# Patient Record
Sex: Male | Born: 1950 | Race: Black or African American | Hispanic: No | Marital: Single | Smoking: Current every day smoker
Health system: Southern US, Community
[De-identification: ages and names within clinical notes are randomized; demographics above are authoritative.]

## PROBLEM LIST (undated history)

## (undated) DIAGNOSIS — J189 Pneumonia, unspecified organism: Secondary | ICD-10-CM

## (undated) DIAGNOSIS — J45909 Unspecified asthma, uncomplicated: Secondary | ICD-10-CM

## (undated) DIAGNOSIS — J449 Chronic obstructive pulmonary disease, unspecified: Secondary | ICD-10-CM

## (undated) DIAGNOSIS — I10 Essential (primary) hypertension: Secondary | ICD-10-CM

## (undated) HISTORY — PX: BACK SURGERY: SHX140

## (undated) HISTORY — PX: ABDOMINAL SURGERY: SHX537

## (undated) HISTORY — PX: VASCULAR SURGERY: SHX849

---

## 2011-11-10 ENCOUNTER — Inpatient Hospital Stay (HOSPITAL_COMMUNITY)
Admission: EM | Admit: 2011-11-10 | Discharge: 2011-11-11 | DRG: 195 | Disposition: A | Discharge status: Home / Self Care | Payer: Medicare Other | Attending: Internal Medicine | Admitting: Internal Medicine

## 2011-11-10 ENCOUNTER — Encounter (HOSPITAL_COMMUNITY): Payer: Self-pay | Admitting: *Deleted

## 2011-11-10 ENCOUNTER — Emergency Department (HOSPITAL_COMMUNITY): Payer: Medicare Other

## 2011-11-10 ENCOUNTER — Inpatient Hospital Stay (HOSPITAL_COMMUNITY): Payer: Medicare Other

## 2011-11-10 DIAGNOSIS — D696 Thrombocytopenia, unspecified: Secondary | ICD-10-CM

## 2011-11-10 DIAGNOSIS — J441 Chronic obstructive pulmonary disease with (acute) exacerbation: Secondary | ICD-10-CM | POA: Diagnosis present

## 2011-11-10 DIAGNOSIS — J189 Pneumonia, unspecified organism: Secondary | ICD-10-CM

## 2011-11-10 DIAGNOSIS — F17200 Nicotine dependence, unspecified, uncomplicated: Secondary | ICD-10-CM

## 2011-11-10 DIAGNOSIS — R911 Solitary pulmonary nodule: Secondary | ICD-10-CM

## 2011-11-10 DIAGNOSIS — J4489 Other specified chronic obstructive pulmonary disease: Secondary | ICD-10-CM | POA: Diagnosis present

## 2011-11-10 DIAGNOSIS — Z23 Encounter for immunization: Secondary | ICD-10-CM

## 2011-11-10 DIAGNOSIS — I10 Essential (primary) hypertension: Secondary | ICD-10-CM

## 2011-11-10 DIAGNOSIS — J449 Chronic obstructive pulmonary disease, unspecified: Secondary | ICD-10-CM

## 2011-11-10 DIAGNOSIS — R918 Other nonspecific abnormal finding of lung field: Secondary | ICD-10-CM

## 2011-11-10 DIAGNOSIS — J438 Other emphysema: Secondary | ICD-10-CM

## 2011-11-10 DIAGNOSIS — R0902 Hypoxemia: Secondary | ICD-10-CM

## 2011-11-10 HISTORY — DX: Pneumonia, unspecified organism: J18.9

## 2011-11-10 HISTORY — DX: Essential (primary) hypertension: I10

## 2011-11-10 HISTORY — DX: Unspecified asthma, uncomplicated: J45.909

## 2011-11-10 HISTORY — DX: Chronic obstructive pulmonary disease, unspecified: J44.9

## 2011-11-10 LAB — CBC
HCT: 39.3 % (ref 39.0–52.0)
Hemoglobin: 13.7 g/dL (ref 13.0–17.0)
RDW: 14.7 % (ref 11.5–15.5)
WBC: 7.3 10*3/uL (ref 4.0–10.5)

## 2011-11-10 LAB — DIFFERENTIAL
Basophils Absolute: 0 10*3/uL (ref 0.0–0.1)
Lymphocytes Relative: 22 % (ref 12–46)
Monocytes Absolute: 0.5 10*3/uL (ref 0.1–1.0)
Monocytes Relative: 7 % (ref 3–12)
Neutro Abs: 5.1 10*3/uL (ref 1.7–7.7)
Neutrophils Relative %: 70 % (ref 43–77)

## 2011-11-10 LAB — BASIC METABOLIC PANEL
CO2: 24 mEq/L (ref 19–32)
Chloride: 99 mEq/L (ref 96–112)
Creatinine, Ser: 1.08 mg/dL (ref 0.50–1.35)
GFR calc Af Amer: 84 mL/min — ABNORMAL LOW (ref >90)
Potassium: 3.5 mEq/L (ref 3.5–5.1)

## 2011-11-10 LAB — STREP PNEUMONIAE URINARY ANTIGEN: Strep Pneumo Urinary Antigen: NEGATIVE

## 2011-11-10 LAB — TROPONIN I: Troponin I: <0.3 ng/mL (ref <0.30)

## 2011-11-10 MED ORDER — IPRATROPIUM BROMIDE 0.02 % IN SOLN
0.5000 mg | Freq: Once | RESPIRATORY_TRACT | Status: AC
  Administered 2011-11-10: 0.5 mg via RESPIRATORY_TRACT
  Filled 2011-11-10: qty 2.5

## 2011-11-10 MED ORDER — DEXTROSE 5 % IV SOLN
1.0000 g | Freq: Once | INTRAVENOUS | Status: AC
  Administered 2011-11-10: 1 g via INTRAVENOUS
  Filled 2011-11-10: qty 10

## 2011-11-10 MED ORDER — ONDANSETRON HCL 4 MG PO TABS: 4.0000 mg | ORAL_TABLET | Freq: Four times a day (QID) | ORAL | Status: DC | PRN

## 2011-11-10 MED ORDER — ALBUTEROL SULFATE (5 MG/ML) 0.5% IN NEBU
5.0000 mg | INHALATION_SOLUTION | Freq: Once | RESPIRATORY_TRACT | Status: AC
  Administered 2011-11-10: 5 mg via RESPIRATORY_TRACT
  Filled 2011-11-10: qty 1

## 2011-11-10 MED ORDER — ALBUTEROL SULFATE (5 MG/ML) 0.5% IN NEBU
2.5000 mg | INHALATION_SOLUTION | Freq: Four times a day (QID) | RESPIRATORY_TRACT | Status: DC
  Administered 2011-11-10 – 2011-11-11 (×5): 2.5 mg via RESPIRATORY_TRACT
  Filled 2011-11-10 (×5): qty 0.5

## 2011-11-10 MED ORDER — LORATADINE 10 MG PO TABS
10.0000 mg | ORAL_TABLET | Freq: Every day | ORAL | Status: DC
  Administered 2011-11-10 – 2011-11-11 (×2): 10 mg via ORAL
  Filled 2011-11-10 (×2): qty 1

## 2011-11-10 MED ORDER — IPRATROPIUM BROMIDE 0.02 % IN SOLN
0.5000 mg | Freq: Four times a day (QID) | RESPIRATORY_TRACT | Status: DC
  Administered 2011-11-10 – 2011-11-11 (×5): 0.5 mg via RESPIRATORY_TRACT
  Filled 2011-11-10 (×5): qty 2.5

## 2011-11-10 MED ORDER — AZITHROMYCIN 250 MG PO TABS
500.0000 mg | ORAL_TABLET | ORAL | Status: DC
  Administered 2011-11-11: 500 mg via ORAL
  Filled 2011-11-10: qty 2

## 2011-11-10 MED ORDER — DEXTROSE 5 % IV SOLN
1.0000 g | INTRAVENOUS | Status: DC
  Administered 2011-11-11: 1 g via INTRAVENOUS
  Filled 2011-11-10 (×2): qty 10

## 2011-11-10 MED ORDER — AMLODIPINE BESYLATE 5 MG PO TABS
5.0000 mg | ORAL_TABLET | Freq: Every day | ORAL | Status: DC
  Administered 2011-11-10: 5 mg via ORAL
  Filled 2011-11-10: qty 1

## 2011-11-10 MED ORDER — ONDANSETRON HCL 4 MG/2ML IJ SOLN: 4.0000 mg | Freq: Four times a day (QID) | INTRAMUSCULAR | Status: DC | PRN

## 2011-11-10 MED ORDER — GABAPENTIN 300 MG PO CAPS
300.0000 mg | ORAL_CAPSULE | Freq: Three times a day (TID) | ORAL | Status: DC
  Administered 2011-11-10 – 2011-11-11 (×4): 300 mg via ORAL
  Filled 2011-11-10 (×3): qty 1

## 2011-11-10 MED ORDER — ACETAMINOPHEN 325 MG PO TABS: 650.0000 mg | ORAL_TABLET | Freq: Four times a day (QID) | ORAL | Status: DC | PRN

## 2011-11-10 MED ORDER — BUDESONIDE-FORMOTEROL FUMARATE 80-4.5 MCG/ACT IN AERO
2.0000 | INHALATION_SPRAY | Freq: Two times a day (BID) | RESPIRATORY_TRACT | Status: DC
  Administered 2011-11-10 – 2011-11-11 (×2): 2 via RESPIRATORY_TRACT
  Filled 2011-11-10: qty 6.9

## 2011-11-10 MED ORDER — OXYCODONE-ACETAMINOPHEN 5-325 MG PO TABS
1.0000 | ORAL_TABLET | Freq: Three times a day (TID) | ORAL | Status: DC | PRN
  Administered 2011-11-10: 1 via ORAL
  Filled 2011-11-10: qty 1

## 2011-11-10 MED ORDER — IOHEXOL 300 MG/ML  SOLN
80.0000 mL | Freq: Once | INTRAMUSCULAR | Status: AC | PRN
  Administered 2011-11-10: 80 mL via INTRAVENOUS

## 2011-11-10 MED ORDER — PANTOPRAZOLE SODIUM 40 MG PO TBEC
40.0000 mg | DELAYED_RELEASE_TABLET | Freq: Every day | ORAL | Status: DC
  Administered 2011-11-10 – 2011-11-11 (×2): 40 mg via ORAL
  Filled 2011-11-10 (×2): qty 1

## 2011-11-10 MED ORDER — ISOSORBIDE DINITRATE 20 MG PO TABS
10.0000 mg | ORAL_TABLET | Freq: Three times a day (TID) | ORAL | Status: DC
  Administered 2011-11-10 (×3): 10 mg via ORAL
  Filled 2011-11-10 (×3): qty 1

## 2011-11-10 MED ORDER — PNEUMOCOCCAL VAC POLYVALENT 25 MCG/0.5ML IJ INJ
0.5000 mL | INJECTION | INTRAMUSCULAR | Status: AC
  Administered 2011-11-11: 0.5 mL via INTRAMUSCULAR
  Filled 2011-11-10: qty 0.5

## 2011-11-10 MED ORDER — IOHEXOL 300 MG/ML  SOLN: 80.0000 mL | Freq: Once | INTRAMUSCULAR | Status: AC | PRN

## 2011-11-10 MED ORDER — MAGNESIUM HYDROXIDE 400 MG/5ML PO SUSP: 30.0000 mL | Freq: Every day | ORAL | Status: DC | PRN

## 2011-11-10 MED ORDER — PREDNISONE 20 MG PO TABS
60.0000 mg | ORAL_TABLET | Freq: Once | ORAL | Status: AC
  Administered 2011-11-10: 60 mg via ORAL
  Filled 2011-11-10: qty 3

## 2011-11-10 MED ORDER — DEXTROSE 5 % IV SOLN
500.0000 mg | Freq: Once | INTRAVENOUS | Status: AC
  Administered 2011-11-10: 500 mg via INTRAVENOUS
  Filled 2011-11-10: qty 500

## 2011-11-10 MED ORDER — ACETAMINOPHEN 650 MG RE SUPP: 650.0000 mg | Freq: Four times a day (QID) | RECTAL | Status: DC | PRN

## 2011-11-10 MED ORDER — TRAZODONE HCL 50 MG PO TABS: 50.0000 mg | ORAL_TABLET | Freq: Every evening | ORAL | Status: DC | PRN

## 2011-11-10 MED ORDER — ENOXAPARIN SODIUM 40 MG/0.4ML ~~LOC~~ SOLN
40.0000 mg | SUBCUTANEOUS | Status: DC
  Administered 2011-11-10 – 2011-11-11 (×2): 40 mg via SUBCUTANEOUS
  Filled 2011-11-10 (×2): qty 0.4

## 2011-11-10 MED ORDER — ALUM & MAG HYDROXIDE-SIMETH 200-200-20 MG/5ML PO SUSP: 30.0000 mL | Freq: Four times a day (QID) | ORAL | Status: DC | PRN

## 2011-11-10 MED ORDER — METOPROLOL SUCCINATE ER 50 MG PO TB24
50.0000 mg | ORAL_TABLET | Freq: Every day | ORAL | Status: DC
  Administered 2011-11-10: 50 mg via ORAL
  Filled 2011-11-10: qty 1
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
## 2011-11-10 NOTE — ED Notes (Signed)
Increased sob with productive cough x 1 wk, worse with exertion.  Also c/o rib pain bil.  Reports used inhaler x 3 since 0500 this morning without relief.

## 2011-11-10 NOTE — ED Notes (Signed)
Patient resting comfortably in bed; states he feels much better.  Remains A&O; skin w/d. Respirations even and unlabored; able to speak in complete sentences without difficulty.

## 2011-11-10 NOTE — H&P (Signed)
Hospital Admission Note Date: 11/10/2011  Patient name: Kevin Holden Medical record number: [id number] Date of birth: 12/22/1950 Age: 61 y.o. Gender: male PCP: No primary provider on file.  Attending physician: Christiane Ha, MD  Chief Complaint: Shortness of breath  History of Present Illness:  Kevin Holden is an 61 y.o. male smoker who presents with shortness of breath. He's also had a cough productive of thick yellow sputum. No fevers or chills. Appetite has been poor. His granddaughter was sick with pneumonia a few weeks ago. He's had no vomiting or diarrhea. He spends part of his time in [place], as part of his time in [place]. No primary care physician in the area. Chest x-ray shows pneumonia and lung nodule. Oxygen saturations originally in the 80s. Normal on supplemental oxygen. According to ED physician, was wheezing upon presentation.  Past Medical History  Diagnosis Date  . Asthma   . COPD (chronic obstructive pulmonary disease)   . Pneumonia   . HTN (hypertension)    reported history of cardiac arrest, no details available  Meds: Prescriptions prior to admission  Medication Sig Dispense Refill  . amLODipine (NORVASC) 5 MG tablet Take 5 mg by mouth daily.      . budesonide-formoterol (SYMBICORT) 80-4.5 MCG/ACT inhaler Inhale 2 puffs into the lungs 2 (two) times daily.      Marland Kitchen gabapentin (NEURONTIN) 300 MG capsule Take 300 mg by mouth 3 (three) times daily.      . isosorbide dinitrate (ISORDIL) 10 MG tablet Take 10 mg by mouth 3 (three) times daily.      Marland Kitchen loratadine (CLARITIN) 10 MG tablet Take 10 mg by mouth daily.      . metoprolol (TOPROL-XL) 200 MG 24 hr tablet Take 200 mg by mouth daily.      Marland Kitchen omeprazole (PRILOSEC) 20 MG capsule Take 20 mg by mouth daily.      Marland Kitchen oxyCODONE-acetaminophen (PERCOCET) 5-325 MG per tablet Take 1 tablet by mouth every 8 (eight) hours as needed. Pain      . Vitamin D, Ergocalciferol, (DRISDOL) 50000 UNITS CAPS Take 50,000 Units  by mouth every 14 (fourteen) days.         Allergies: Aspirin  Social history Smokes a pack of cigarettes every day and a half. Denies alcohol or drug use. Disabled from back problems. 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 Abdominal surgery     Review of Systems: Systems reviewed and as per HPI, otherwise negative.  Physical Exam: Blood pressure 111/70, pulse 87, temperature 97.7 F (36.5 C), temperature source Oral, resp. rate 18, height 6' (1.829 m), weight 57.335 kg (126 lb 6.4 oz), SpO2 96.00%. BP 111/70  Pulse 87  Temp 97.7 F (36.5 C) (Oral)  Resp 18  Ht 6' (1.829 m)  Wt 57.335 kg (126 lb 6.4 oz)  BMI 17.14 kg/m2  SpO2 96%  General Appearance:    Alert, cooperative, no distress, thin, unkempt  Head:    Normocephalic, without obvious abnormality, atraumatic  Eyes:    PERRL, conjunctiva/corneas clear, EOM's intact, fundi    benign, both eyes       Ears:    Normal TM's and external ear canals, both ears  Nose:   Nares normal, septum midline, mucosa normal, no drainage    or sinus tenderness  Throat:   Lips, mucosa, and tongue normal, poor dentition   Neck:  Supple, symmetrical, trachea midline, no adenopathy;       thyroid:  No enlargement/tenderness/nodules; no carotid   bruit or JVD  Back:     Symmetric, no curvature, ROM normal, no CVA tenderness  Lungs:     Clear to auscultation bilaterally, respirations unlabored, bronchial breath sounds at the bases   Chest wall:    No tenderness or deformity  Heart:    Regular rate and rhythm, S1 and S2 normal, no murmur, rub   or gallop  Abdomen:     Soft, non-tender, bowel sounds active all four quadrants,    no masses, no organomegaly  Genitalia:    Normal male without lesion, discharge or tenderness  Rectal:    Normal tone, normal prostate, no masses or tenderness;   guaiac negative stool  Extremities:   Extremities  normal, atraumatic, no cyanosis or edema  Pulses:   2+ and symmetric all extremities  Skin:   Skin color, texture, turgor normal, no rashes or lesions  Lymph nodes:   Cervical, supraclavicular, and axillary nodes normal  Neurologic:   CNII-XII intact. Normal strength, sensation and reflexes      throughout    Psychiatric: Normal affect  Lab results: Basic Metabolic Panel:  Basename 11/10/11 0714  NA 136  K 3.5  CL 99  CO2 24  GLUCOSE 109*  BUN 9  CREATININE 1.08  CALCIUM 9.2  MG --  PHOS --   Liver Function Tests: No results found for this basename: AST:2,ALT:2,ALKPHOS:2,BILITOT:2,PROT:2,ALBUMIN:2 in the last 72 hours No results found for this basename: LIPASE:2,AMYLASE:2 in the last 72 hours No results found for this basename: AMMONIA:2 in the last 72 hours CBC:  Basename 11/10/11 0714  WBC 7.3  NEUTROABS 5.1  HGB 13.7  HCT 39.3  MCV 79.1  PLT 221   Cardiac Enzymes:  Basename 11/10/11 0714  CKTOTAL --  CKMB --  CKMBINDEX --  TROPONINI <0.30   BNP: No results found for this basename: PROBNP:3 in the last 72 hours D-Dimer: No results found for this basename: DDIMER:2 in the last 72 hours CBG: No results found for this basename: GLUCAP:6 in the last 72 hours Hemoglobin A1C: No results found for this basename: HGBA1C in the last 72 hours Fasting Lipid Panel: No results found for this basename: CHOL,HDL,LDLCALC,TRIG,CHOLHDL,LDLDIRECT in the last 72 hours Thyroid Function Tests: No results found for this basename: TSH,T4TOTAL,FREET4,T3FREE,THYROIDAB in the last 72 hours Anemia Panel: No results found for this basename: VITAMINB12,FOLATE,FERRITIN,TIBC,IRON,RETICCTPCT in the last 72 hours Coagulation: No results found for this basename: LABPROT:2,INR:2 in the last 72 hours Urine Drug Screen: Drugs of Abuse  No results found for this basename: labopia, cocainscrnur, labbenz, amphetmu, thcu, labbarb    Imaging results:  Dg Chest Port 1 View  11/10/2011   *RADIOLOGY REPORT*  Clinical Data: Shortness of breath  PORTABLE CHEST - 1 VIEW  Comparison: None.  Findings: 1.6 cm nodular opacity overlying the left lower lung. Nipple shadow is possible but considered unlikely given irregular appearance.  Additional mild patchy opacity in the left lower lobe, suspicious for pneumonia.  No pleural effusion or pneumothorax.  Cardiomediastinal silhouette is within normal limits.  IMPRESSION: Patchy left lower lobe opacity, suspicious for pneumonia.  1.6 cm nodular opacity overlying the left lower lung.  Consider CT chest with contrast for further characterization.  Original Report Authenticated By: Charline Bills, M.D.    Assessment & Plan: Principal Problem:  *CAP (community acquired pneumonia) Active Problems:  COPD (chronic obstructive pulmonary disease)  Hypoxia  Lung nodule  Current every day smoker  Benign hypertension  Patient has received Rocephin and azithromycin. I will continue this. He will see bronchodilators and a dose of prednisone in the emergency room. He is no longer wheezing. No need first further steroids at this time. Continue bronchodilators however. Continue oxygen therapy. Get CAT scan of the chest to rule out lung mass. Continue outpatient medications. Needs to quit smoking.  Suezette Lafave L 11/10/2011, 9:41 AM

## 2011-11-11 DIAGNOSIS — D696 Thrombocytopenia, unspecified: Secondary | ICD-10-CM

## 2011-11-11 DIAGNOSIS — R918 Other nonspecific abnormal finding of lung field: Secondary | ICD-10-CM

## 2011-11-11 DIAGNOSIS — J438 Other emphysema: Secondary | ICD-10-CM

## 2011-11-11 DIAGNOSIS — F17200 Nicotine dependence, unspecified, uncomplicated: Secondary | ICD-10-CM

## 2011-11-11 LAB — LEGIONELLA ANTIGEN, URINE: Legionella Antigen, Urine: NEGATIVE

## 2011-11-11 MED ORDER — METOPROLOL SUCCINATE ER 25 MG PO TB24
12.5000 mg | ORAL_TABLET | Freq: Every day | ORAL | Status: DC
  Administered 2011-11-11: 12.5 mg via ORAL
  Filled 2011-11-11: qty 1

## 2011-11-11 MED ORDER — LEVOFLOXACIN 500 MG PO TABS: 500.0000 mg | ORAL_TABLET | Freq: Every day | ORAL | Status: AC

## 2011-11-11 MED ORDER — ALBUTEROL SULFATE HFA 108 (90 BASE) MCG/ACT IN AERS: 2.0000 | INHALATION_SPRAY | Freq: Four times a day (QID) | RESPIRATORY_TRACT | Status: DC | PRN

## 2011-11-11 MED ORDER — ACETAMINOPHEN 325 MG PO TABS: 650.0000 mg | ORAL_TABLET | Freq: Four times a day (QID) | ORAL | Status: AC | PRN

## 2011-11-11 NOTE — Discharge Summary (Signed)
Physician Discharge Summary  Patient ID: Kevin Holden MRN: [id number] DOB/AGE: 1950-10-23 61 y.o.  Admit date: 11/10/2011 Discharge date: 11/11/2011  Discharge Diagnoses:  Principal Problem:  *CAP (community acquired pneumonia) Active Problems:  COPD (chronic obstructive pulmonary disease)  Hypoxia  Lung nodule, needs repeat CT of the chest 1 to 2 months  Current every day smoker  Benign hypertension  Medication List  As of 11/11/2011  1:17 PM   STOP taking these medications         amLODipine 5 MG tablet      metoprolol 200 MG 24 hr tablet         TAKE these medications         acetaminophen 325 MG tablet   Commonly known as: TYLENOL   Take 2 tablets (650 mg total) by mouth every 6 (six) hours as needed for pain or fever (or Fever >/= 101).      albuterol 108 (90 BASE) MCG/ACT inhaler   Commonly known as: PROVENTIL HFA;VENTOLIN HFA   Inhale 2 puffs into the lungs every 6 (six) hours as needed for wheezing.      budesonide-formoterol 80-4.5 MCG/ACT inhaler   Commonly known as: SYMBICORT   Inhale 2 puffs into the lungs 2 (two) times daily.      gabapentin 300 MG capsule   Commonly known as: NEURONTIN   Take 300 mg by mouth 3 (three) times daily.      isosorbide dinitrate 10 MG tablet   Commonly known as: ISORDIL   Take 10 mg by mouth 3 (three) times daily.      levofloxacin 500 MG tablet   Commonly known as: LEVAQUIN   Take 1 tablet (500 mg total) by mouth daily.      loratadine 10 MG tablet   Commonly known as: CLARITIN   Take 10 mg by mouth daily.      omeprazole 20 MG capsule   Commonly known as: PRILOSEC   Take 20 mg by mouth daily.      oxyCODONE-acetaminophen 5-325 MG per tablet   Commonly known as: PERCOCET   Take 1 tablet by mouth every 8 (eight) hours as needed. Pain      Vitamin D (Ergocalciferol) 50000 UNITS Caps   Commonly known as: DRISDOL   Take 50,000 Units by mouth every 14 (fourteen) days.           Discharge Orders    Future  Orders Please Complete By Expires   Diet - low sodium heart healthy      Increase activity slowly      Discharge instructions      Comments:   Quit smoking. You will need a repeat CAT scan of the chest and one to 2 months. Your primary care provider must arrange.     Follow-up Information    Follow up with your doctor on 11/18/2011. (You will need a repeat CAT scan of the chest in one to 2 months)          Disposition: home  Discharged Condition: stable  Consults:  none  Labs:   Results for orders placed during the hospital encounter of 11/10/11 (from the past 48 hour(s))  CBC     Status: Normal   Collection Time   11/10/11  7:14 AM      Component Value Range Comment   WBC 7.3  4.0 - 10.5 K/uL    RBC 4.97  4.22 - 5.81 MIL/uL    Hemoglobin 13.7  13.0 - 17.0  g/dL    HCT 91.4  78.2 - 95.6 %    MCV 79.1  78.0 - 100.0 fL    MCH 27.6  26.0 - 34.0 pg    MCHC 34.9  30.0 - 36.0 g/dL    RDW 21.3  08.6 - 57.8 %    Platelets 221  150 - 400 K/uL   DIFFERENTIAL     Status: Normal   Collection Time   11/10/11  7:14 AM      Component Value Range Comment   Neutrophils Relative 70  43 - 77 %    Neutro Abs 5.1  1.7 - 7.7 K/uL    Lymphocytes Relative 22  12 - 46 %    Lymphs Abs 1.6  0.7 - 4.0 K/uL    Monocytes Relative 7  3 - 12 %    Monocytes Absolute 0.5  0.1 - 1.0 K/uL    Eosinophils Relative 1  0 - 5 %    Eosinophils Absolute 0.1  0.0 - 0.7 K/uL    Basophils Relative 0  0 - 1 %    Basophils Absolute 0.0  0.0 - 0.1 K/uL   BASIC METABOLIC PANEL     Status: Abnormal   Collection Time   11/10/11  7:14 AM      Component Value Range Comment   Sodium 136  135 - 145 mEq/L    Potassium 3.5  3.5 - 5.1 mEq/L    Chloride 99  96 - 112 mEq/L    CO2 24  19 - 32 mEq/L    Glucose, Bld 109 (*) 70 - 99 mg/dL    BUN 9  6 - 23 mg/dL    Creatinine, Ser 4.69  0.50 - 1.35 mg/dL    Calcium 9.2  8.4 - 62.9 mg/dL    GFR calc non Af Amer 73 (*) >90 mL/min    GFR calc Af Amer 84 (*) >90 mL/min     TROPONIN I     Status: Normal   Collection Time   11/10/11  7:14 AM      Component Value Range Comment   Troponin I <0.30  <0.30 ng/mL   HIV ANTIBODY (ROUTINE TESTING)     Status: Normal   Collection Time   11/10/11  7:14 AM      Component Value Range Comment   HIV NON REACTIVE  NON REACTIVE   STREP PNEUMONIAE URINARY ANTIGEN     Status: Normal   Collection Time   11/10/11  1:07 PM      Component Value Range Comment   Strep Pneumo Urinary Antigen NEGATIVE  NEGATIVE     Diagnostics:  Ct Chest W Contrast  11/10/2011  *RADIOLOGY REPORT*  Clinical Data: Follow-up for abnormal chest x-ray.  Evaluate lung nodule.  CT CHEST WITH CONTRAST  Technique:  Multidetector CT imaging of the chest was performed following the standard protocol during bolus administration of intravenous contrast.  Contrast: 80mL OMNIPAQUE IOHEXOL 300 MG/ML  SOLN  Comparison: Chest x-ray 11/10/2011.  Findings:  Mediastinum: Heart size is normal. Small amount of focal anterior pericardial fluid and/or thickening (measuring up to 11 mm in thickness on image 53 of series 2).  No associated pericardial calcification. There is atherosclerosis of the thoracic aorta, the great vessels of the mediastinum and the coronary arteries, including calcified atherosclerotic plaque in the left main, left anterior descending, left circumflex and right coronary arteries. No pathologically enlarged mediastinal or hilar lymph nodes. Esophagus is unremarkable in appearance.  Lungs/Pleura: In the lateral basal segment of the left lower lobe there is a pleural-based mass like opacity that measures approximately 3.2 x 2.2 cm.  This lesion is located within an area of dependent interstitial prominence with some additional patchy airspace opacities, which is similar to but more pronounced than the contralateral side.  There is mild diffuse bronchial wall thickening throughout the lungs bilaterally, most pronounced in the lower lobes of the lungs bilaterally.   There is a trace left-sided pleural effusion, and images 56 of both series 2 and 3 demonstrate a tiny locule of gas within the left pleural space; this is of uncertain etiology and significance.  There is a background of mild centrilobular and paraseptal emphysema, most pronounced in the lung apices where there are multiple subpleural blebs and bulla (right greater than left).  Scattered throughout the periphery of the lungs bilaterally there are multifocal areas of centrilobular "tree in bud" micronodularity, likely reflect areas of mucoid impaction within the terminal bronchioles.  A few other scattered larger pulmonary nodules are also noted, including the a 7 mm nodule in the left lower lobe (image 51 of series 3), and a 5 mm nodule in the lateral aspect of the left upper lobe (image 29 of series 3). No pleural effusions.  Upper Abdomen: There is an incompletely visualized low attenuation lesion in the interpolar region of the left kidney which measures at least 7 mm.  Atherosclerosis.  Otherwise, unremarkable.  Musculoskeletal: There are no aggressive appearing lytic or blastic lesions noted in the visualized portions of the skeleton. Postoperative changes of ACDF in the lower cervical spine are incompletely visualized.  IMPRESSION: 1.  The pulmonary nodule noted on recent chest radiograph corresponds to an area of what appears to be masslike airspace consolidation in the lateral basal segment of the left lower lobe. There is some additional interstitial and airspace opacities throughout the lung bases bilaterally, favored to be of infectious or inflammatory etiology, potentially related to recent aspiration. However, these are nonspecific and there are multiple additional pulmonary nodules seen in the lungs bilaterally (as above), which warrant attention on a short term follow up chest CT to exclude neoplasm.  At this time, a follow-up CT scan of the thorax in 1-2 months after appropriate trial of  antimicrobial therapy is recommended. 2. Trace left-sided pleural effusion with tiny locule of gas within the left pleural space.  This is of uncertain etiology and significance.  If there has been recent attempted thoracentesis, this could be iatrogenic.  In the appropriate clinical setting, this could be indicative of infection of the left pleural space with gas forming organisms, however, this is not strongly favored based on this appearance on today's examination. 3.  Mild diffuse bronchial wall thickening with mild centrilobular paraseptal emphysema; findings compatible with underlying COPD. 4. Atherosclerosis, including left main and three-vessel coronary artery disease. Please note that although the presence of coronary artery calcium documents the presence of coronary artery disease, the severity of this disease and any potential stenosis cannot be assessed on this non-gated CT examination.  Assessment for potential risk factor modification, dietary therapy or pharmacologic therapy may be warranted, if clinically indicated. 5.  Small amount of focal anterior pericardial fluid and/or thickening is of uncertain etiology and significance, but is unlikely to be of hemodynamic significance at this time.  Attention on follow-up studies is recommended.  Original Report Authenticated By: Florencia Reasons, M.D.   Dg Chest Port 1 View  11/10/2011  *RADIOLOGY REPORT*  Clinical Data: Shortness of breath  PORTABLE CHEST - 1 VIEW  Comparison: None.  Findings: 1.6 cm nodular opacity overlying the left lower lung. Nipple shadow is possible but considered unlikely given irregular appearance.  Additional mild patchy opacity in the left lower lobe, suspicious for pneumonia.  No pleural effusion or pneumothorax.  Cardiomediastinal silhouette is within normal limits.  IMPRESSION: Patchy left lower lobe opacity, suspicious for pneumonia.  1.6 cm nodular opacity overlying the left lower lung.  Consider CT chest with contrast  for further characterization.  Original Report Authenticated By: Charline Bills, M.D.    Procedures: none  Full Code   Hospital Course: H&P for complete admission details. The patient is a pleasant 61 year old black male smoker who presents with shortness of breath and coughing. His oxygen saturations initially in the emergency room were in the 80s. He was wheezing initially. Chest x-ray showed infiltrate and possibly lung nodule. White blood cell count was normal. Blood pressure, heart rate, temperature normal. He was monitored overnight on oxygen, bronchodilators, antibiotics. He lives part of the time in [place] and his primary care physician is in [place]. He smokes just under a pack of cigarettes a day. CAT scan of the chest shows probable infectious process, but needs short-term repeat CAT scan to ensure resolution. Patient is oxygenating fine off oxygen. He has no further wheezing. His shortness of breath and cough has improved. He has ambulated about the unit. His blood pressure has been borderline low, but he's been asymptomatic. I've asked him to hold his Toprol-XL and his Norvasc. He reports that he has a followup appointment with his primary care physician in a week. I've asked him to bring a copy of his records from any [facility] hospital including CAT scan report to his physician. I will also give him a DVT of the CAT scan. He is stable for discharge and feels well enough to go home. Total time on the day of discharge greater than 30 minutes.  Discharge Exam:  Blood pressure 92/58, pulse 72, temperature 97.7 F (36.5 C), temperature source Oral, resp. rate 20, height 6' (1.829 m), weight 57.335 kg (126 lb 6.4 oz), SpO2 95.00%.  Gen. oxygen off. Comfortable. Exam unchanged from 11/10/2011.  SignedChristiane Ha 11/11/2011, 1:17 PM

## 2011-11-11 NOTE — Progress Notes (Signed)
Discussed D/C instructions and smoking cessation with pt.  Went through pt's home medications and placed X's on the bottles of medications that he needed to discontinue.  Pt given new prescriptions, teachback done on home care and home medications.  Pt states he has an appt with his doctor on June 20th and he will keep appointment.  Pt given D/C summary, H&P, CXR, and CT results prior to d/c.  Pt understands that he needs to take them with him to his f/u appointment. IV removed, site WNL. Pt is A&O and very much desires to gone home.  Pt was taken down in wheelchair by myself to his friends vehicle.

## 2011-11-24 NOTE — Progress Notes (Signed)
UR Chart Review Completed  

## 2012-05-28 ENCOUNTER — Emergency Department: Payer: Self-pay | Admitting: Emergency Medicine

## 2012-05-28 LAB — BASIC METABOLIC PANEL
Anion Gap: 10 (ref 7–16)
Calcium, Total: 9.1 mg/dL (ref 8.5–10.1)
Co2: 26 mmol/L (ref 21–32)
EGFR (African American): >60
EGFR (Non-African Amer.): >60
Osmolality: 278 (ref 275–301)

## 2012-05-28 LAB — CBC
HCT: 40.4 % (ref 40.0–52.0)
MCH: 29.1 pg (ref 26.0–34.0)
MCV: 83 fL (ref 80–100)
RDW: 14.9 % — ABNORMAL HIGH (ref 11.5–14.5)
WBC: 10 10*3/uL (ref 3.8–10.6)

## 2012-05-28 LAB — CK TOTAL AND CKMB (NOT AT ARMC)
CK, Total: 319 U/L — ABNORMAL HIGH (ref 35–232)
CK-MB: 2.4 ng/mL (ref 0.5–3.6)

## 2012-06-14 ENCOUNTER — Emergency Department: Payer: Self-pay | Admitting: Unknown Physician Specialty

## 2012-06-14 LAB — LIPASE, BLOOD: Lipase: 181 U/L (ref 73–393)

## 2012-06-14 LAB — CBC
HCT: 41.5 % (ref 40.0–52.0)
HGB: 13.9 g/dL (ref 13.0–18.0)
Platelet: 231 10*3/uL (ref 150–440)
WBC: 8.2 10*3/uL (ref 3.8–10.6)

## 2012-06-14 LAB — COMPREHENSIVE METABOLIC PANEL
BUN: 21 mg/dL — ABNORMAL HIGH (ref 7–18)
Bilirubin,Total: 0.2 mg/dL (ref 0.2–1.0)
Calcium, Total: 9.1 mg/dL (ref 8.5–10.1)
Co2: 27 mmol/L (ref 21–32)
Creatinine: 1.92 mg/dL — ABNORMAL HIGH (ref 0.60–1.30)
EGFR (African American): 43 — ABNORMAL LOW
Osmolality: 284 (ref 275–301)
Sodium: 141 mmol/L (ref 136–145)

## 2012-06-14 LAB — TROPONIN I: Troponin-I: <0.02 ng/mL

## 2012-06-15 LAB — URINALYSIS, COMPLETE
Bacteria: NONE SEEN
Bilirubin,UR: NEGATIVE
Nitrite: NEGATIVE
Ph: 5 (ref 4.5–8.0)
WBC UR: 12 /HPF (ref 0–5)

## 2013-10-29 ENCOUNTER — Ambulatory Visit: Payer: Self-pay | Admitting: Vascular Surgery

## 2013-10-29 LAB — BASIC METABOLIC PANEL
Anion Gap: 5 — ABNORMAL LOW (ref 7–16)
BUN: 14 mg/dL (ref 7–18)
CALCIUM: 8.7 mg/dL (ref 8.5–10.1)
CO2: 28 mmol/L (ref 21–32)
CREATININE: 0.88 mg/dL (ref 0.60–1.30)
Chloride: 107 mmol/L (ref 98–107)
EGFR (African American): >60
EGFR (Non-African Amer.): >60
GLUCOSE: 91 mg/dL (ref 65–99)
Osmolality: 279 (ref 275–301)
Potassium: 3.7 mmol/L (ref 3.5–5.1)
SODIUM: 140 mmol/L (ref 136–145)

## 2013-11-12 ENCOUNTER — Ambulatory Visit: Payer: Self-pay | Admitting: Vascular Surgery

## 2013-11-12 LAB — CREATININE, SERUM: CREATININE: 1.15 mg/dL (ref 0.60–1.30)

## 2013-11-12 LAB — BUN: BUN: 15 mg/dL (ref 7–18)

## 2013-12-24 ENCOUNTER — Emergency Department: Payer: Self-pay | Admitting: Emergency Medicine

## 2013-12-24 LAB — CBC
HCT: 39.2 % — ABNORMAL LOW (ref 40.0–52.0)
HGB: 13 g/dL (ref 13.0–18.0)
MCH: 28.5 pg (ref 26.0–34.0)
MCHC: 33.1 g/dL (ref 32.0–36.0)
MCV: 86 fL (ref 80–100)
PLATELETS: 251 10*3/uL (ref 150–440)
RBC: 4.56 10*6/uL (ref 4.40–5.90)
RDW: 16 % — AB (ref 11.5–14.5)
WBC: 8.3 10*3/uL (ref 3.8–10.6)

## 2013-12-24 LAB — BASIC METABOLIC PANEL
Anion Gap: 8 (ref 7–16)
BUN: 15 mg/dL (ref 7–18)
CALCIUM: 8.4 mg/dL — AB (ref 8.5–10.1)
CHLORIDE: 107 mmol/L (ref 98–107)
CO2: 25 mmol/L (ref 21–32)
Creatinine: 1.16 mg/dL (ref 0.60–1.30)
EGFR (Non-African Amer.): >60
GLUCOSE: 116 mg/dL — AB (ref 65–99)
OSMOLALITY: 281 (ref 275–301)
Potassium: 3.7 mmol/L (ref 3.5–5.1)
Sodium: 140 mmol/L (ref 136–145)

## 2013-12-24 LAB — CK TOTAL AND CKMB (NOT AT ARMC)
CK, Total: 192 U/L
CK-MB: 1.6 ng/mL (ref 0.5–3.6)

## 2013-12-24 LAB — TROPONIN I: Troponin-I: <0.02 ng/mL

## 2014-01-30 ENCOUNTER — Emergency Department: Payer: Self-pay | Admitting: Emergency Medicine

## 2014-01-30 LAB — CBC WITH DIFFERENTIAL/PLATELET
BASOS PCT: 0.8 %
Basophil #: 0.1 10*3/uL (ref 0.0–0.1)
EOS PCT: 0.9 %
Eosinophil #: 0.1 10*3/uL (ref 0.0–0.7)
HCT: 41.2 % (ref 40.0–52.0)
HGB: 13.1 g/dL (ref 13.0–18.0)
LYMPHS PCT: 20.4 %
Lymphocyte #: 1.9 10*3/uL (ref 1.0–3.6)
MCH: 27.6 pg (ref 26.0–34.0)
MCHC: 31.9 g/dL — ABNORMAL LOW (ref 32.0–36.0)
MCV: 87 fL (ref 80–100)
Monocyte #: 0.8 x10 3/mm (ref 0.2–1.0)
Monocyte %: 9 %
NEUTROS PCT: 68.9 %
Neutrophil #: 6.3 10*3/uL (ref 1.4–6.5)
Platelet: 261 10*3/uL (ref 150–440)
RBC: 4.75 10*6/uL (ref 4.40–5.90)
RDW: 14.9 % — ABNORMAL HIGH (ref 11.5–14.5)
WBC: 9.2 10*3/uL (ref 3.8–10.6)

## 2014-01-30 LAB — COMPREHENSIVE METABOLIC PANEL
ALBUMIN: 3 g/dL — AB (ref 3.4–5.0)
ALK PHOS: 94 U/L
ALT: 12 U/L — AB
ANION GAP: 6 — AB (ref 7–16)
AST: 27 U/L (ref 15–37)
BILIRUBIN TOTAL: 0.4 mg/dL (ref 0.2–1.0)
BUN: 9 mg/dL (ref 7–18)
CREATININE: 0.97 mg/dL (ref 0.60–1.30)
Calcium, Total: 8.6 mg/dL (ref 8.5–10.1)
Chloride: 111 mmol/L — ABNORMAL HIGH (ref 98–107)
Co2: 27 mmol/L (ref 21–32)
EGFR (African American): >60
GLUCOSE: 87 mg/dL (ref 65–99)
OSMOLALITY: 285 (ref 275–301)
POTASSIUM: 4 mmol/L (ref 3.5–5.1)
Sodium: 144 mmol/L (ref 136–145)
TOTAL PROTEIN: 7.4 g/dL (ref 6.4–8.2)

## 2014-01-30 LAB — TSH: THYROID STIMULATING HORM: 1.5 u[IU]/mL

## 2014-01-30 LAB — TROPONIN I

## 2014-06-11 ENCOUNTER — Inpatient Hospital Stay: Payer: Self-pay | Admitting: Internal Medicine

## 2014-06-11 LAB — CBC WITH DIFFERENTIAL/PLATELET
BASOS PCT: 0.2 %
Basophil #: 0 10*3/uL (ref 0.0–0.1)
EOS ABS: 0 10*3/uL (ref 0.0–0.7)
Eosinophil %: 0.2 %
HCT: 41.6 % (ref 40.0–52.0)
HGB: 13.6 g/dL (ref 13.0–18.0)
LYMPHS ABS: 0.9 10*3/uL — AB (ref 1.0–3.6)
LYMPHS PCT: 13.2 %
MCH: 28.1 pg (ref 26.0–34.0)
MCHC: 32.7 g/dL (ref 32.0–36.0)
MCV: 86 fL (ref 80–100)
MONO ABS: 0.6 x10 3/mm (ref 0.2–1.0)
Monocyte %: 9.8 %
NEUTROS ABS: 5 10*3/uL (ref 1.4–6.5)
Neutrophil %: 76.6 %
Platelet: 156 10*3/uL (ref 150–440)
RBC: 4.84 10*6/uL (ref 4.40–5.90)
RDW: 14.9 % — AB (ref 11.5–14.5)
WBC: 6.6 10*3/uL (ref 3.8–10.6)

## 2014-06-11 LAB — COMPREHENSIVE METABOLIC PANEL
ALBUMIN: 3.2 g/dL — AB (ref 3.4–5.0)
ALK PHOS: 82 U/L
ANION GAP: 9 (ref 7–16)
BUN: 10 mg/dL (ref 7–18)
Bilirubin,Total: 0.4 mg/dL (ref 0.2–1.0)
Calcium, Total: 8.3 mg/dL — ABNORMAL LOW (ref 8.5–10.1)
Chloride: 104 mmol/L (ref 98–107)
Co2: 24 mmol/L (ref 21–32)
Creatinine: 1.44 mg/dL — ABNORMAL HIGH (ref 0.60–1.30)
EGFR (African American): >60
EGFR (Non-African Amer.): 53 — ABNORMAL LOW
GLUCOSE: 129 mg/dL — AB (ref 65–99)
OSMOLALITY: 275 (ref 275–301)
POTASSIUM: 3.6 mmol/L (ref 3.5–5.1)
SGOT(AST): 32 U/L (ref 15–37)
SGPT (ALT): 17 U/L
SODIUM: 137 mmol/L (ref 136–145)
TOTAL PROTEIN: 7.2 g/dL (ref 6.4–8.2)

## 2014-06-11 LAB — URINALYSIS, COMPLETE
Bacteria: NONE SEEN
Bilirubin,UR: NEGATIVE
Glucose,UR: 150 mg/dL (ref 0–75)
KETONE: NEGATIVE
Leukocyte Esterase: NEGATIVE
NITRITE: NEGATIVE
Ph: 5 (ref 4.5–8.0)
Specific Gravity: 1.02 (ref 1.003–1.030)
Squamous Epithelial: <1

## 2014-06-11 LAB — TROPONIN I

## 2014-06-11 LAB — CK TOTAL AND CKMB (NOT AT ARMC)
CK, TOTAL: 344 U/L — AB (ref 39–308)
CK-MB: 1 ng/mL (ref 0.5–3.6)

## 2014-06-12 LAB — BASIC METABOLIC PANEL
ANION GAP: 9 (ref 7–16)
BUN: 13 mg/dL (ref 7–18)
CHLORIDE: 106 mmol/L (ref 98–107)
CO2: 25 mmol/L (ref 21–32)
CREATININE: 1.18 mg/dL (ref 0.60–1.30)
Calcium, Total: 8.1 mg/dL — ABNORMAL LOW (ref 8.5–10.1)
EGFR (Non-African Amer.): >60
GLUCOSE: 133 mg/dL — AB (ref 65–99)
Osmolality: 281 (ref 275–301)
POTASSIUM: 3.9 mmol/L (ref 3.5–5.1)
SODIUM: 140 mmol/L (ref 136–145)

## 2014-06-16 LAB — CULTURE, BLOOD (SINGLE)

## 2014-07-27 ENCOUNTER — Inpatient Hospital Stay: Payer: Self-pay | Admitting: Surgery

## 2014-09-21 NOTE — Op Note (Signed)
PATIENT NAME:  Kevin Holden, Kevin Holden MR#:  [id number] DATE OF BIRTH:  [birth year]  DATE OF PROCEDURE:  11/12/2013  PREOPERATIVE DIAGNOSIS: 1.  Peripheral arterial disease with claudication, bilateral lower extremities, status post right lower extremity revascularization.   POSTOPERATIVE DIAGNOSIS:  1.  Peripheral arterial disease with claudication, bilateral lower extremities, status post right lower extremity revascularization.   PROCEDURES PERFORMED:  1.  Ultrasound guidance for vascular access to the right femoral artery.  2. Catheter placement to the left anterior tibial and posterior tibial arteries from right femoral approach.  3.  Aortogram and selective left lower extremity angiogram.  4.  Percutaneous transluminal angioplasty of left anterior tibial artery with 3 mm diameter angioplasty balloon.  5.  Percutaneous transluminal angioplasty of left posterior tibial artery with 3 mm diameter angioplasty balloon, Crosser atherectomy and percutaneous transluminal angioplasty of left SFA and above-knee popliteal artery with 5 mm diameter angioplasty balloon in the S6 Crosser atherectomy.  6.  Drug-coated angioplasty balloon to the proximal and distal endpoints in the proximal SFA and the distal SFA/above-knee popliteal artery with 6 mm diameter drug-coated Lutonix angioplasty balloon.  7.  StarClose closure device, right femoral artery.   SURGEON:  Annice Needy, M.D.   ANESTHESIA:  Local with moderate conscious sedation.   ESTIMATED BLOOD LOSS:  25 mL.   FLUOROSCOPY TIME:  Approximately 21 minutes.   CONTRAST USED:  90 mL.   INDICATION FOR PROCEDURE:  A 64 year old male with severe peripheral vascular disease. I have treated his right lower extremity a few weeks ago with good results. He comes back for an attempt at treatment of left lower extremity. Risks and benefits were discussed. Informed consent was obtained.   DESCRIPTION OF PROCEDURE: The patient is brought to the vascular suite.  Groins were shaved and prepped and a sterile surgical field was created. The ultrasound was used to visualize a patent right femoral artery. It was accessed under direct ultrasound guidance without difficulty with a Seldinger needle. A J-wire and 5 French sheath were placed. Pigtail catheter was placed in the aorta and an AP aortogram was performed. The previously placed right external iliac artery stent was widely patent. The left iliacs were patent. The renal arteries were patent. I then crossed the aortic bifurcation and advanced to the left femoral head. Selective left lower extremity angiogram was then performed. This showed a near flush occlusion of the left superficial femoral artery with reconstitution at [facility] canal distally and it had tibial disease distally as well. The patient was systemically heparinized. A 6 French Ansel sheath was placed over a Terumo Advantage wire. Attempts at gaining access to the SFA with a Kumpe catheter and Advantage wire were not successful, and I elected to use the Crosser atherectomy device. I used this with the Usher pressure catheter and the S6 device and was able to gain access to the SFA and continue our Crosser atherectomy down near the distal endpoint at [facility] canal. I then re-entered and confirmed intraluminal flow with an Advantage wire and a Kumpe catheter. I then turned my attention to the tibial disease. I was able to cross the high-grade stenosis in the anterior tibial artery over its first 6 to 8 cm with a 135 CXI catheter and exchanged for an 0.018 wire. Balloon angioplasty was performed in the anterior tibial artery with markedly improved flow with a mild residual stenosis of about 30%. I then turned my attention to gaining access to the posterior tibial artery. The 0.018 wire and CXI catheter  were used and I was able to cross the short segment occlusion in the posterior tibial artery and confirm intraluminal flow in the mid posterior tibial artery. I  replaced the wire and treated this lesion with a 3 mm diameter angioplasty balloon with an excellent angiographic completion result. I then exchanged for the 0.035 wire a 5 mm diameter x 30 cm length angioplasty balloon. This was inflated from the above-knee popliteal artery to the common femoral artery encompassing the areas we had performed Crosser atherectomy on. There was marked improved flow. Both the [facility] and the proximal SFA still had residual significant stenosis and I elected to treat these areas with 6 mm diameter drug-coated Lutonix angioplasty balloon. The distal endpoint was treated first with a good angiographic completion result and about a 20% residual stenosis. The proximal endpoint was then treated and there was about a 40% to  50% residual stenosis, but this was not a location where I elected to place a stent and I elected to leave this non-flow-limiting lesion. The flow was then markedly improved. We confirmed that the distal flow had not been harmed, and it had not, and at this point I elected to terminate the procedure. The sheath was pulled back to the ipsilateral external iliac artery and oblique arteriogram was performed. StarClose closure device was deployed in the usual fashion with excellent hemostatic result. The patient tolerated the procedure well and was taken to the recovery room in stable condition.     ____________________________ Annice NeedyJason S. Lilyanne Mcquown, MD jsd:dmm D: 11/12/2013 11:08:00 ET T: 11/12/2013 11:27:38 ET JOB#: [id number]  cc: Annice NeedyJason S. Doreen Garretson, MD, <Dictator> Annice NeedyJASON S Taylor Levick MD ELECTRONICALLY SIGNED 11/15/2013 10:54

## 2014-09-21 NOTE — Op Note (Signed)
PATIENT NAME:  Kevin BurrowLAWSON, Jairus MR#:  [id number] DATE OF BIRTH:  03/06/1951  DATE OF PROCEDURE:  10/29/2013  PREOPERATIVE DIAGNOSES:  1. Peripheral arterial disease with claudication, bilateral lower extremities.  2. Hypertension. 3. Chronic obstructive pulmonary disease.   POSTOPERATIVE DIAGNOSES:  1. Peripheral arterial disease with claudication, bilateral lower extremities.  2. Hypertension. 3. Chronic obstructive pulmonary disease.   PROCEDURE: 1. Ultrasound guidance for vascular access, left femoral artery.  2. Catheter placement to right anterior tibial artery and right posterior tibial artery from left femoral approach.  3. Aortogram and selective right lower extremity angiogram.  4. Percutaneous transluminal angioplasty of right external iliac artery with 7 mm diameter angioplasty balloon.  5. Self-expanding stent placement to right external iliac artery for greater than 50% residual stenosis and dissection after angioplasty with an 8 mm diameter self-expanding stent.  6. Drug-coated angioplasty balloon to the right superficial femoral artery with a 6 mm diameter x 10 cm in length Lutonix balloon.  7. Catheter-directed thrombolysis with 4 mg of tPA to the right popliteal and anterior tibial arteries.  8. Mechanical rheolytic thrombectomy with the AngioJet catheter to the right popliteal and anterior tibial arteries.  9. Percutaneous transluminal angioplasty of right anterior tibial artery with a long 3 mm diameter angioplasty balloon.  10. Percutaneous transluminal angioplasty of origin of right anterior tibial artery with a 4 mm diameter angioplasty balloon.  11. Percutaneous transluminal angioplasty of right tibioperoneal trunk and proximal posterior tibial artery with a 4 mm diameter drug-coated Lutonix angioplasty balloon.  12. StarClose closure device, left femoral artery.   SURGEON: Festus BarrenJason Dew, M.D.   ANESTHESIA: Local with moderate conscious sedation.   ESTIMATED BLOOD LOSS:  20 to 25 mL.  FLUOROSCOPY TIME: Approximately 9 minutes.   CONTRAST USED: 110 mL.   INDICATION FOR PROCEDURE: This is a 64 year old African American male with extensive peripheral vascular disease. He has very short distance claudication currently. He has multiple other medical comorbidities and ongoing symptoms. He has had noninvasive studies which showed significant vascular disease. He is brought in for angiography for further evaluation and potential treatment. Risks and benefits were discussed. Informed consent was obtained.   DESCRIPTION OF THE PROCEDURE: The patient was brought to the vascular suite. After an adequate level of intravenous sedation was obtained, the groins were sterilely prepped and draped and a sterile surgical field was created. The left femoral head was localized with fluoroscopy and the femoral arteries were visualized with ultrasound and found to be patent. It was then accessed under direct ultrasound guidance with a Seldinger needle and a 5-French sheath was placed. Pigtail catheter was placed into the AP aorta at the L1-2 level and an AP aortagram was performed which showed good flow into the renal arteries. The aorta had calcific disease, but no stenosis. The iliacs showed a 75 to 80% stenosis in the right external iliac artery just beyond its origin. The left external iliac artery had what appeared to be a little less than 50% stenosis, although direct imaging was not performed, but our attention was focused on the right lower extremity today. He has planned intervention for the left lower extremity in the future where we would address any iliac disease at that time. He had multilevel severe lower extremity infrainguinal disease as well on the right. His right SFA had a short segment occlusion. It reconstituted a few centimeters beyond the occlusion. The anterior tibial artery was occluded at its origin and had some stenosis in the mid to distal segment.  The posterior tibial  artery and tibioperoneal trunk had a near occlusive stenosis and then the posterior tibial artery was then continuous to the foot. The patient was given 4,000 units of intravenous heparin for systemic anticoagulation and a 6-French Ansell sheath was placed over a Terumo Advantage wire. I navigated through the iliac lesion, the SFA lesion and down into the posterior tibial lesion initially the SFA lesion with the short segment occlusion. It was unclear if this was embolic or native disease and there did appear to be some potential thrombus in this location. I treated the posterior tibial artery and tibioperoneal trunk with a 4 mm diameter Lutonix drug-coated angioplasty balloon. A waist was taken which resolved with angioplasty. I then turned my attention to opening the occluded anterior tibial artery. A Kumpe catheter and the Advantage wire were used to cross the occlusion, exchanged for a CXI catheter and confirmed intraluminal flow in the distal anterior tibial artery just above the ankle. I then replaced the wire and treated the anterior tibial artery with a 3 mm diameter angioplasty balloon from the ankle to its origin. Several areas of waist were taken which resolved with angioplasty. The SFA was still an occlusive lesion at this point, and I treated it with a 6 mm diameter Lutonix drug-coated angioplasty balloon. A waist was taken which resolved with angioplasty. Completion angiogram following this showed the SFA now to be widely patent, but there now appeared to be thrombosis, likely from the SFA lesion down into the popliteal, tibioperoneal trunk, and anterior tibial arteries. I then delivered 4 mg of tPA with the AngioJet catheter throughout the popliteal artery, then to the anterior tibial artery. This was allowed to dwell for 15 minutes and mechanical rheolytic thrombectomy was performed in the popliteal artery and anterior tibial artery. After this, the origin of the anterior tibial artery was treated  with a 4 mm diameter angioplasty balloon with the resultant marked improvement and patent flow in the anterior tibial artery into the foot. The posterior tibial artery had some occlusions several centimeters beyond its origin and in the mid-segment and I did not elect to go after this for fear of harming our only runoff at this point. The SFA lesion was now widely patent and did not need a stent. I turned my attention to the iliac lesion. This was initially treated with a 7 mm diameter conventional angioplasty balloon. A waist was taken which resolved with angioplasty. However, there was essentially no significant improvement in about a 70% residual stenosis with a dissection now at the origin of the external iliac artery. I elected to treat this area with an 8 mm diameter self-expanding stent and post dilated with a 7 mm balloon with an excellent angiographic completion result and no significant residual stenosis. At this point, I elected to terminate the procedure. The sheath was pulled back to the ipsilateral external iliac artery and oblique arteriogram was performed. StarClose closure device was deployed in the usual fashion with excellent hemostatic result. The patient tolerated the procedure well and was taken to the recovery room in stable condition.    ____________________________ Annice Needy, MD jsd:es D: 10/29/2013 12:09:09 ET T: 10/29/2013 13:26:16 ET JOB#: [id number]  cc: Annice Needy, MD, <Dictator> Annice Needy MD ELECTRONICALLY SIGNED 10/29/2013 14:54

## 2014-09-29 NOTE — Consult Note (Signed)
PATIENT NAME:  Kevin Holden, Kevin Holden MR#:  [id number] DATE OF BIRTH:  Sep 22, 1950  DATE OF CONSULTATION:  06/13/2014  CONSULTING PHYSICIAN:  Yevonne PaxSaadat A. Darionna Banke, MD  REASON FOR CONSULTATION: Respiratory failure.   HISTORY OF PRESENT ILLNESS: This is a 64 year old African American male who has a past history of COPD and hypertension, came into the hospital with increased cough and congestion.  The patient has also been having some hemoptysis.  Currently, the patient says that blood has actually resolved.  He had no chest pain.  No palpitations. He was having wheezing. He does smoke and has been still smoking. The patient says that he has had no nausea or vomiting. There is no diarrhea.  He looks a lot more comfortable and says is improving.   PAST MEDICAL HISTORY:  Significant for chronic obstructive pulmonary disease, hypertension, gastroesophageal reflux disease, peripheral artery disease and neuropathy.   PAST SURGICAL HISTORY: A history of cervical disk history and angioplasty.   ALLERGIES: ASPIRIN.   SOCIAL HISTORY: The patient continues to smoke,   MEDICATIONS: Medications are reviewed on the electronic medical record.   REVIEW OF SYSTEMS:  A complete 10 point review of system is performed and was unremarkable other than what is noted above in the history of present illness.   PHYSICAL EXAMINATION:  VITAL SIGNS: At the time that he was seen, temperature 98.2, pulse 87, respiratory rate 20, blood pressure 124/81, saturations 90%.  NECK: Supple. There was no JVD. No adenopathy. No thyromegaly.  CHEST: No rales, but positive rhonchi. Expansion was equal.  CARDIOVASCULAR: S1, S2 is normal. Regular rhythm. No gallop or rub.  ABDOMEN: Soft and nontender.  NEUROLOGIC: The patient was awake and alert, moving all 4 extremities.  EXTREMITIES: Without cyanosis or clubbing.  SKIN: No acute rashes.  MUSCULOSKELETAL: There was no active synovitis.   LABORATORY DATA:  CBC white count 6.6, hemoglobin 13.6,  hematocrit 41.6, BUN 10, creatinine 1.44, sodium 137, potassium 3.6. Troponins were negative.   Chest x-ray showed no mass and there was some evidence of possible pneumonia. The patient did have a CT of the chest done, and it shows peribronchial thickening, bronchitic changes and there was a small 6 mm pleural-based nodule in the right upper lobe.   IMPRESSION:  1.  Acute hemoptysis.  2.  Pneumonia.  3.  Pulmonary nodule.   PLAN:  We would be happy to see the patient in the office for follow-up.  He probably has hemoptysis related to bronchitis or pneumonia which is actually improving with antibiotic therapy.  Will continue with antibiotic therapy. Encourage smoking cessation. The patient also has a nodule, which needs to be followed up and I would be happy to see the patient in the office setting.  Thank you for consulting me in the care of this patient.      ____________________________ Yevonne PaxSaadat A. Lella Mullany, MD sak:DT D: 06/14/2014 09:17:32 ET T: 06/14/2014 10:03:27 ET JOB#: [id number]  cc: Yevonne PaxSaadat A. Drina Jobst, MD, <Dictator> Yevonne PaxSAADAT A Sonia Bromell MD ELECTRONICALLY SIGNED 06/15/2014 10:54

## 2014-09-29 NOTE — Discharge Summary (Signed)
PATIENT NAME:  Kevin Holden, Kevin Holden MR#:  [id number] DATE OF BIRTH:  [birth year]  DATE OF ADMISSION:  06/11/2014 DATE OF DISCHARGE:  06/14/2014  DISCHARGE DIAGNOSES: 1.  Acute hypoxic respiratory failure. 2.  Hemoptysis 3. Chronic obstructive pulmonary disease exacerbation due to right lower lobe pneumonia. 4.  Right lower lobe bronchopneumonia.  SECONDARY DIAGNOSES:   1.  Hypertension.  2.  COPD.  3.  Active smoker.  4.  Gastroesophageal reflux disease.  5.  Chronic neuropathy.  6.  Peripheral artery disease, status post stenting.   CONSULTATION: Pulmonary, Yevonne PaxSaadat A. Khan, MD  PROCEDURES AND RADIOLOGY: CT scan of the chest without contrast on January 13 showed right bronchopneumonia, 6 mm pleural-based nodule in the right upper lobe.  2.  Chest x-ray on January 12 showed new right pneumonia, COPD.  MAJOR LABORATORY PANEL: Urinalysis on admission was negative. Blood culture x1 was negative.   HISTORY AND SHORT HOSPITAL COURSE: The patient is a 64 year old male with above-mentioned medical problems admitted for shortness of breath with cough. He was found to have acute hypoxic respiratory failure with COPD exacerbation from underlying new right lower lobe pneumonia. Please see Dr. Reddy's dictated history and physical for further details. Pulmonary consultation was obtained with Dr. Welton FlakesKhan, who recommended CT scan of the chest, which was obtained with details as dictated above. The patient was feeling much better by January 15 and was discharged home in stable condition.  PERTINENT PHYSICAL EXAMINATION ON DISCHARGE: VITAL SIGNS: Temperature 97.7, heart rate 74 per minute, respirations 20 per minute, blood pressure 127/82. He was saturating 92% on room air at rest, 89% with exertion on room air. CARDIOVASCULAR: S1, S2 normal. No murmurs, rubs, or gallop. LUNGS: Clear to auscultation bilaterally. No wheezing, rales, rhonchi, or crepitation. ABDOMEN: Soft, benign. NEUROLOGIC: Nonfocal  examination.   All other physical examination remained at baseline.  DISCHARGE MEDICATIONS:   Medication Instructions  gabapentin 300 mg oral capsule  1 cap(s) orally 3 times a day   loratadine 10 mg oral tablet  1 tab(s) orally once a day   omeprazole 20 mg oral delayed release capsule  1 cap(s) orally 2 times a day   isosorbide dinitrate 10 mg oral tablet  1 tab(s) orally 3 times a day   ergocalciferol 50,000 intl units (1.25 mg) oral capsule  1 cap(s) orally once a month   amitriptyline 25 mg oral tablet  1 tab(s) orally once a day (at bedtime), As Needed - for Inability to Sleep   plavix 75 mg oral tablet  1 tab(s) orally once a day   proair hfa 90 mcg/inh inhalation aerosol  2 puff(s) inhaled every 4 hours, As Needed - for Shortness of Breath - for Wheezing    symbicort 160 mcg-4.5 mcg/inh inhalation aerosol  2 puff(s) inhaled 2 times a day   atorvastatin 10 mg oral tablet  1 tab(s) orally once a day (at bedtime)   metoprolol extended release 200 mg oral tablet, extended release  1 tab(s) orally once a day   methocarbamol 500 mg oral tablet  1 tab(s) orally 3 times a day   acetaminophen-hydrocodone 325 mg-10 mg oral tablet  1 tab(s) orally every 8 hours, As Needed   symbicort 160 mcg-4.5 mcg/inh inhalation aerosol  2 puff(s) inhaled 2 times a day   albuterol cfc free 90 mcg/inh inhalation aerosol  2 puff(s) inhaled 4 times a day   prednisone 10 mg oral tablet  Start at 60 mg and taper by 10 mg daily until complete  amoxicillin-clavulanate  875 milligram(s) orally 2 times a day x 7 days     DISCHARGE DIET: Low-sodium.  DISCHARGE ACTIVITY: As tolerated.  DISCHARGE INSTRUCTIONS AND FOLLOWUP: The patient was instructed to follow up with his primary care physician at [place].  TOTAL TIME SPENT ON DISCHARGE: 35 minutes.   ____________________________ Ellamae Sia. Sherryll Burger, MD vss:ST D: 06/14/2014 23:19:51 ET T: 06/15/2014 00:10:39 ET JOB#: [id number]  cc: Denorris Reust S. Sherryll Burger, MD,  <Dictator> Unknown cc Patricia Pesa MD ELECTRONICALLY SIGNED 06/20/2014 10:12

## 2014-09-29 NOTE — Op Note (Signed)
PATIENT NAME:  Kevin BurrowLAWSON, Romaldo MR#:  [id number] DATE OF BIRTH:  [birth year]  DATE OF PROCEDURE:  07/27/2014  PREOPERATIVE DIAGNOSIS: Midline scrotal raphe abscess.   POSTOPERATIVE DIAGNOSIS: Midline scrotal raphe abscess.   SURGEON: Claude MangesWilliam F Matisse Salais, MD   ANESTHESIA: General.   OPERATION PERFORMED: Incision and drainage of scrotal raphe abscess (deep).   PROCEDURE IN DETAIL: The patient was placed in the lithotomy position and prepped and draped in the usual sterile fashion. An incision was made in the midline scrotal raphe in the perineum essentially at the perineal body and carried down through the subcutaneous tissue and about 1.5 cm of tissue before the pus was reached. There was approximately 15 mL of light green, non-foul smelling pus. This was all adequately drained and hemostasis was achieved with electrocautery. The wound was packed with normal saline-soaked 2 inch Kling and this was placed within the cavity and fit nicely. Dry 4 x 4 gauzes, sponges and mesh panties were then applied completing the procedure. The patient tolerated the procedure well. There were no complications.    ____________________________ Claude MangesWilliam F. Ruthanna Macchia, MD wfm:at D: 07/27/2014 13:09:17 ET T: 07/27/2014 19:01:29 ET JOB#: [id number]  cc: Claude MangesWilliam F. Juwaun Inskeep, MD, <Dictator> Claude MangesWILLIAM F Benoit Meech MD ELECTRONICALLY SIGNED 07/28/2014 13:17

## 2014-09-29 NOTE — H&P (Signed)
History of Present Illness 13 yobm who has had an enlarging painful mass between his anus and scrotum for four days. He denies fever, chills, drainage, or anything like this before.   Past History Aortoiliac occlusive disease (iliac stent)   Past Med/Surgical Hx:  Neuropathy:   GERD:   HTN:   COPD:   Cervical disc surgery:   ALLERGIES:  Aspirin: Unknown  HOME MEDICATIONS: Medication Instructions Status  predniSONE 10 mg oral tablet Start at 60 mg and taper by 10 mg daily until complete Active  albuterol CFC free 90 mcg/inh inhalation aerosol 2 puff(s) inhaled 4 times a day Active  amitriptyline 25 mg oral tablet 1 tab(s) orally once a day (at bedtime), As Needed - for Inability to Sleep Active  Plavix 75 mg oral tablet 1 tab(s) orally once a day Active  ProAir HFA 90 mcg/inh inhalation aerosol 2 puff(s) inhaled every 4 hours, As Needed - for Shortness of Breath - for Wheezing  Active  Symbicort 160 mcg-4.5 mcg/inh inhalation aerosol 2 puff(s) inhaled 2 times a day Active  atorvastatin 10 mg oral tablet 1 tab(s) orally once a day (at bedtime) Active  metoprolol extended release 200 mg oral tablet, extended release 1 tab(s) orally once a day Active  methocarbamol 500 mg oral tablet 1 tab(s) orally 3 times a day Active  acetaminophen-HYDROcodone 325 mg-10 mg oral tablet 1 tab(s) orally every 8 hours, As Needed Active  gabapentin 300 mg oral capsule 1 cap(s) orally 3 times a day Active  loratadine 10 mg oral tablet 1 tab(s) orally once a day Active  omeprazole 20 mg oral delayed release capsule 1 cap(s) orally 2 times a day Active  isosorbide dinitrate 10 mg oral tablet 1 tab(s) orally 3 times a day Active  ergocalciferol 50,000 intl units (1.25 mg) oral capsule 1 cap(s) orally once a month Active   Family and Social History:  Family History Coronary Artery Disease  Stroke  mom died in her 68s of an MI; dad died in his 70s of a CVA   Social History positive  tobacco (Current within  1 year), positive ETOH, negative Illicit drugs, lives alone, smokes 1/2 PPD, drinks a 6-pack of beer once a week   Place of Living Home   Review of Systems:  Fever/Chills No   Cough No   Sputum No   Abdominal Pain No   Diarrhea No   Constipation No   Nausea/Vomiting No   SOB/DOE No   Chest Pain No   Dysuria No   Tolerating PT Yes   Tolerating Diet Yes   Medications/Allergies Reviewed Medications/Allergies reviewed   Physical Exam:  GEN no acute distress, cachectic, disheveled   HEENT pink conjunctivae, PERRL, hearing intact to voice, moist oral mucosa, Oropharynx clear, poor dentition, horrible dentition   NECK supple  trachea midline   RESP normal resp effort  clear BS  no use of accessory muscles   CARD regular rate  no murmur  No LE edema  no JVD  no Rub   ABD denies tenderness  soft  normal BS   GU 6 cm fluctuant very tender mass in scrotal midline raphe, with no obvious (peri)anal involvement   LYMPH negative neck   EXTR negative cyanosis/clubbing, negative edema   SKIN skin turgor decreased   NEURO cranial nerves intact, negative tremor, follows commands, motor/sensory function intact   PSYCH alert, A+O to time, place, person   Lab Results: Hepatic:  27-Feb-16 09:35   Bilirubin, Total 0.7  Alkaline Phosphatase 82  SGPT (ALT)  6  SGOT (AST) 20  Total Protein, Serum 7.2  Albumin, Serum  2.8  Routine Chem:  27-Feb-16 09:35   Glucose, Serum  110  BUN  5  Creatinine (comp) 1.09  Sodium, Serum 140  Potassium, Serum 3.5  Chloride, Serum 105  CO2, Serum 27  Calcium (Total), Serum 8.5  Osmolality (calc) 277  eGFR (African American) >60  eGFR (Non-African American) >60 (eGFR values <73m/min/1.73 m2 may be an indication of chronic kidney disease (CKD). Calculated eGFR, using the MRDR Study equation, is useful in  patients with stable renal function. The eGFR calculation will not be reliable in acutely ill patients when serum creatinine  is changing rapidly. It is not useful in patients on dialysis. The eGFR calculation may not be applicable to patients at the low and high extremes of body sizes, pregnant women, and vegetarians.)  Anion Gap 8  Routine Coag:  27-Feb-16 09:35   Prothrombin 14.9 (11.4-15.0 NOTE: New Reference Range  06/28/14)  INR 1.2 (INR reference interval applies to patients on anticoagulant therapy. A single INR therapeutic range for coumarins is not optimal for all indications; however, the suggested range for most indications is 2.0 - 3.0. Exceptions to the INR Reference Range may include: Prosthetic heart valves, acute myocardial infarction, prevention of myocardial infarction, and combinations of aspirin and anticoagulant. The need for a higher or lower target INR must be assessed individually. Reference: The Pharmacology and Management of the Vitamin K  antagonists: the seventh ACCP Conference on Antithrombotic and Thrombolytic Therapy. CGQQPY.1950Sept:126 (3suppl): [id number] A HCT value >55% may artifactually increase the PT.  In one study,  the increase was an average of 25%. Reference:  "Effect on Routine and Special Coagulation Testing Values of Citrate Anticoagulant Adjustment in Patients with High HCT Values." American Journal of Clinical Pathology 2006;126:400-405.)  Routine Hem:  27-Feb-16 09:35   WBC (CBC)  16.4  RBC (CBC)  4.33  Hemoglobin (CBC)  11.8  Hematocrit (CBC)  37.1  Platelet Count (CBC) 285 (Result(s) reported on 27 Jul 2014 at 10:06AM.)  MCV 86  MCH 27.3  MCHC  31.9  RDW  15.6    Assessment/Admission Diagnosis scrotal raphe abscess, in a patient on Plavix   Plan Admit, IV ABx, I&D, with ABC available in OR   Electronic Signatures: MConsuela Mimes(MD)  (Signed 27-Feb-16 11:25)  Authored: CHIEF COMPLAINT and HISTORY, PAST MEDICAL/SURGIAL HISTORY, ALLERGIES, HOME MEDICATIONS, FAMILY AND SOCIAL HISTORY, REVIEW OF SYSTEMS, PHYSICAL EXAM, LABS, ASSESSMENT AND  PLAN   Last Updated: 27-Feb-16 11:25 by MConsuela Mimes(MD)

## 2014-09-29 NOTE — Discharge Summary (Signed)
PATIENT NAME:  Kevin Holden, Kevin Holden DATE OF BIRTH:  Jun 09, 1950  DATE OF ADMISSION:  07/27/2014 DATE OF DISCHARGE:  07/29/2014  PRINCIPAL DIAGNOSIS:  Perineal body (midline scrotal raphe) abscess.   OTHER DIAGNOSES: Aortoiliac occlusive disease. Vitamin D deficiency. Coronary artery disease, chronic obstructive pulmonary disease, hypertension, gastroesophageal reflux disease.   PRINCIPAL PROCEDURE PERFORMED DURING THIS ADMISSION, 07/27/2014:  Incision and drainage of scrotal raphe abscess (deep).   HOSPITAL COURSE: The patient was admitted to the hospital and underwent the above-mentioned procedure for the above-mentioned diagnosis. On postoperative day 2, his packing fell out and he was discharged home on Keflex as well as his usual medications and asked to make an appointment to see me in a week and to call the office in the interim for any problems.     ____________________________ Claude MangesWilliam F. Andrzej Scully, MD wfm:tr D: 08/07/2014 19:47:08 ET T: 08/07/2014 20:33:01 ET JOB#: [id number]  cc: Claude MangesWilliam F. Patrcia Schnepp, MD, <Dictator> Claude MangesWILLIAM F Haji Delaine MD ELECTRONICALLY SIGNED 08/08/2014 20:15

## 2014-09-29 NOTE — H&P (Signed)
PATIENT NAME:  Kevin Holden, Kevin Holden MR#:  [id number] DATE OF BIRTH:  02/10/1951  DATE OF ADMISSION:  06/11/2014  REFERRING DOCTOR: Darci Current, MD  PRIMARY CARE PRACTITIONER: Dr. Marchelle Folks  from [place].   ADMITTING DOCTOR: Crissie Figures, MD   CHIEF COMPLAINT:  1.  Shortness of breath with wheezing.  2.  Cough with expectoration.   HISTORY OF PRESENT ILLNESS: A 64 year old African American male with past medical history of COPD, hypertension, active smoker, GERD, chronic neuropathy, peripheral arterial disease, status post stent presents with the complaints of shortness of breath associated with wheezing and a cough with productive sputum for the past 1 to 2 days. The patient called EMS for the shortness of breath and EMS found the patient with shortness of breath with O2  saturations in the upper 80s, was given DuoNebs and brought to the Emergency Room for further evaluation.   In the Emergency Room, the patient was evaluated by the ED physician and was found to have acute respiratory distress with cough and wheezing, was given DuoNebs, IV Solu-Medrol. Workup revealed chest x-ray with a right lower lobe pneumonia. Hence, hospitalist service was consulted for further evaluation and management. The patient gives a history of productive sputum for the past 1 to 2 days, which caused the increased shortness of breath with wheezing. He also has some fever, felt hot for the past 1 to 2 days but did not measure his temperature. No chest pain. He did have some nausea initially but no vomiting, no diarrhea, no abdominal pain, no urinary symptoms. The patient is receiving oxygen supplementation and currently he is comfortably lying in the bed, states that he is feeling better and his oxygen saturations are maintaining in the mid 90s.   PAST MEDICAL HISTORY:  1.  Hypertension.  2.  COPD.  3.  Active smoker.  4.  Gastroesophageal reflux disease.  5.  Chronic neuropathy.  6.  Peripheral artery disease,  status post stenting.   PAST SURGICAL HISTORY:  1.  Cervical disk surgery.  2.  Angioplasty with stent placement for peripheral arterial disease.   ALLERGIES: ASPIRIN.   FAMILY HISTORY: No history of hypertension, coronary artery disease, COPD, or cancers.   SOCIAL HISTORY: He is single. A resident of [place] but frequently comes to [place] to visit his sons who stay here, history of smoking half pack per day for the past many years. He is disabled. Occasional alcohol intake on social occasions present.  Denies any substance abuse.    HOME MEDICATIONS:  1.  Acetaminophen/hydrocodone 325/10 mg tablet, 1 tablet orally every 8 hours as needed.  2.  Albuterol inhaler 90 mcg/inhalation, 2 puffs 4 times a day.  3.  Amitriptyline 20 mg tablet, 1 tablet at bedtime as needed for inability to sleep.  4.  Atorvastatin 10 mg tablet, 1 tablet orally once a day.  5.  Vitamin D 50,000 units 1 capsule orally once a month.  6.  Gabapentin 300 mg capsule, 1 capsule orally 3 times a day.  7.  Isosorbide dinitrate 10 mg tablet, 1 tablet orally 3 times a day.  8.  Loratadine 10 mg tablet, 1 tablet orally once a day.  9.  Methocarbamol 500 mg tablet, 1 tablet orally 3 times a day.  10.  Metoprolol extended release tablet, 200 mg tablet, 1 tablet orally once a day.  11.  Omeprazole 20 mg tablet, 1 tablet orally once a day.  12.  Plavix 75 mg tablet, 1 tablet orally once a  day.  13.  Prednisone 20 mg tablet, 1 tablet orally once a day.  14.  ProAir 90 mcg inhalation as needed every 4 hours.  15.  Symbicort 160/4.5 mcg per inhalation, 2 puffs inhalation 2 times a day.    REVIEW OF SYSTEMS:  CONSTITUTIONAL: Positive for fevers . No chills. No fatigue. No generalized weakness.  EYES: Negative for blurred vision, double vision. No pain. No redness. No discharge.  EARS, NOSE, AND THROAT: Negative for tinnitus, ear pain, hearing loss, epistaxis, nasal discharge, or difficulty swallowing.  RESPIRATORY:  Positive for cough with productive sputum of greenish-yellow color. Positive for wheezing with dyspnea present. No hemoptysis. No painful respirations.  CARDIOVASCULAR: Negative for chest pain, palpitations, dizziness, syncope, orthopnea, pedal edema, or dyspnea on exertion.   GASTROINTESTINAL: Negative for nausea, vomiting, diarrhea, constipation, abdominal pain, hematemesis. Laryngeal  GENITOURINARY: Negative for dysuria, frequency, urgency.  ENDOCRINE: Negative for polyuria, nocturia, heat or cold intolerance.  HEMATOLOGY AND LYMPHATICS: Negative for anemia, easy bruising, bleeding swollen glands.  INTEGUMENTARY: Negative for acne, skin rash, or lesions.  MUSCULOSKELETAL: Positive for chronic neuropathy pain for which he takes chronic pain medications.  NEUROLOGICAL: Negative for focal weakness, numbness, tremor. No history of CVA, TIA, seizure disorder.  PSYCHIATRIC: Negative for anxiety, insomnia, depression.   PHYSICAL EXAMINATION:  VITAL SIGNS: Temperature 100.4 degrees Fahrenheit, pulse rate 118 per minute, respirations 20 per minute, blood pressure 125/70, O2 saturations 90% on room.  GENERAL: Thin built, well nourished, alert, in no acute distress, comfortably resting in the bed, disheveled.  HEAD: Atraumatic, normocephalic.  EYES: Pupils equal, react to light and accommodation. No conjunctival pallor. No scleral icterus. Extraocular movement intact.  NOSE: No drainage.  No lesions.  EARS: No drainage. No external lesions.  ORAL CAVITY: No mucosal lesions. No exudates. Poor dentition.  NECK: Supple. No JVD. No thyromegaly. No carotid bruit. Range of motion of neck normal.  RESPIRATORY: Bilateral air entry present. Not using accessory muscles of respiration. Bilateral rhonchi present, rales present in the right base posteriorly.  CARDIOVASCULAR: S1, S2 regular. No murmurs, gallops, or clicks appreciated. Peripheral pulses at carotid, femoral, pedal pulses are equal. No peripheral edema.   GASTROINTESTINAL: Abdomen soft, nontender. No hepatosplenomegaly. No masses. No rigidity. No guarding. Bowel sounds present and equal in all 4 quadrants.  GENITOURINARY: Deferred.  MUSCULOSKELETAL: No joint tenderness or effusion. Range of motion adequate. Strength and tone equal bilaterally.  SKIN: Inspection within normal limits. No obvious wounds.  LYMPHATIC: No cervical lymphadenopathy.  VASCULAR: Good dorsalis pedis, posterior tibial pulses.  NEUROLOGICAL: Alert, awake, and oriented x 3. Cranial nerves II through XII grossly intact. No sensory deficit. Motor strength 5/5 in both upper and lower extremities. DTRs 2+ bilateral, symmetrical. Plantars downgoing.  PSYCHIATRIC: Alert, awake, and oriented x 3. Judgment and insight adequate. Memory and mood within normal limits.   LABORATORY DATA: Serum glucose 129, BUN 10, creatinine 1.4, serum sodium 137, potassium 3.6, chloride 104, bicarbonate 24, calcium 8.3, total protein 7.2, albumin 3.2, total bilirubin 0.4, alkaline phosphatase 8.2, AST 32, ALT 17, total CK 344,  CK-MB 1.0, troponin less than 0.02. WBC 6.6, hemoglobin 13.6, platelet count 156.   IMAGING STUDIES: Chest x-ray:   1.  Findings of COPD.   2.  Minimal right basilar opacity concern for mild pneumonia.    EKG: Sinus tachycardia with ventricular rate of 124 beats per minute, no acute ST-T changes.   ASSESSMENT AND PLAN: A 64 year old African American male with past medical history of chronic obstructive pulmonary  disease, active smoker, hypertension, peripheral arterial disease, status post stent and angioplasty, gastroesophageal reflux disease, chronic neuropathy presents with shortness of breath, wheezing, productive cough, fever of 2 to 3 days duration.  1.  Acute hypoxic respiratory failure secondary due to chronic obstructive pulmonary disease exacerbation. Plan: Admit to medical floor, oxygen supplementation, vigorous DuoNebs, IV Solu-Medrol monitor O2 saturations.  2.   Chronic obstructive pulmonary disease exacerbation secondary due to right lower lobe pneumonia and continued smoking. Plan: Admit to medical floor, oxygen supplementation, DuoNebs, IV Solu-Medrol, and blood and sputum cultures, IV antibiotics, ceftriaxone and azithromycin, continue Symbicort.  3.  Right lower lobe pneumonia. Plan: As above.   4.  Hypertension, controlled on home medications.  5.  Chronic smoker. Counseled to quit. The patient was not motivated. Offered nicotine replacement treatment. The patient not decided.  6.  Gastroesophageal reflux disease, stable on proton pump inhibitor. Continue same.  7.  Chronic neuropathy, stable on home medications. Continue same.   8.  Peripheral artery disease, status post angioplasty with stent placement, stable. Continue Plavix and statin.  9.  Mildly elevated creatinine likely secondary due to dehydration. Plan: Gentle IV hydration, followup BMP, avoid nephrotoxic agents.  10.  Deep vein thrombosis prophylaxis, subcutaneous heparin.  11.  Gastrointestinal prophylaxis, proton pump inhibitors.   CODE STATUS: Full code.   TIME SPENT: 50 minutes.   ____________________________ Crissie FiguresEdavally N. Bannon Giammarco, MD enr:AT D: 06/11/2014 04:59:39 ET T: 06/11/2014 05:36:12 ET JOB#: [id number]  cc: Crissie FiguresEdavally N. Jermery Caratachea, MD, <Dictator> Unknown CC Crissie FiguresEDAVALLY N Yesika Rispoli MD ELECTRONICALLY SIGNED 06/12/2014 12:28

## 2014-10-05 ENCOUNTER — Other Ambulatory Visit: Payer: Self-pay

## 2014-10-05 ENCOUNTER — Encounter: Payer: Self-pay | Admitting: Emergency Medicine

## 2014-10-05 ENCOUNTER — Emergency Department
Admission: EM | Admit: 2014-10-05 | Discharge: 2014-10-05 | Disposition: A | Discharge status: Home / Self Care | Payer: Medicare Other | Attending: Emergency Medicine | Admitting: Emergency Medicine

## 2014-10-05 ENCOUNTER — Emergency Department: Payer: Medicare Other

## 2014-10-05 DIAGNOSIS — R197 Diarrhea, unspecified: Secondary | ICD-10-CM | POA: Insufficient documentation

## 2014-10-05 DIAGNOSIS — R109 Unspecified abdominal pain: Secondary | ICD-10-CM | POA: Insufficient documentation

## 2014-10-05 DIAGNOSIS — Z79899 Other long term (current) drug therapy: Secondary | ICD-10-CM | POA: Diagnosis not present

## 2014-10-05 DIAGNOSIS — I10 Essential (primary) hypertension: Secondary | ICD-10-CM | POA: Diagnosis not present

## 2014-10-05 DIAGNOSIS — Z720 Tobacco use: Secondary | ICD-10-CM | POA: Insufficient documentation

## 2014-10-05 DIAGNOSIS — R1110 Vomiting, unspecified: Secondary | ICD-10-CM | POA: Diagnosis not present

## 2014-10-05 LAB — URINALYSIS COMPLETE WITH MICROSCOPIC (ARMC ONLY)
BACTERIA UA: NONE SEEN
Bilirubin Urine: NEGATIVE
GLUCOSE, UA: NEGATIVE mg/dL
Ketones, ur: NEGATIVE mg/dL
LEUKOCYTES UA: NEGATIVE
NITRITE: NEGATIVE
Protein, ur: 30 mg/dL — AB
Specific Gravity, Urine: 1.015 (ref 1.005–1.030)
pH: 8 (ref 5.0–8.0)

## 2014-10-05 LAB — COMPREHENSIVE METABOLIC PANEL
ALT: 27 U/L (ref 17–63)
ANION GAP: 10 (ref 5–15)
AST: 28 U/L (ref 15–41)
Albumin: 4.3 g/dL (ref 3.5–5.0)
Alkaline Phosphatase: 89 U/L (ref 38–126)
BILIRUBIN TOTAL: 0.5 mg/dL (ref 0.3–1.2)
BUN: 15 mg/dL (ref 6–20)
CO2: 26 mmol/L (ref 22–32)
Calcium: 9.1 mg/dL (ref 8.9–10.3)
Chloride: 103 mmol/L (ref 101–111)
Creatinine, Ser: 1.04 mg/dL (ref 0.61–1.24)
GFR calc Af Amer: >60 mL/min (ref >60)
Glucose, Bld: 92 mg/dL (ref 65–99)
Potassium: 4 mmol/L (ref 3.5–5.1)
SODIUM: 139 mmol/L (ref 135–145)
Total Protein: 7.6 g/dL (ref 6.5–8.1)

## 2014-10-05 LAB — LIPASE, BLOOD: LIPASE: 33 U/L (ref 22–51)

## 2014-10-05 LAB — CBC WITH DIFFERENTIAL/PLATELET
Basophils Absolute: 0 10*3/uL (ref 0–0.1)
Basophils Relative: 1 %
EOS PCT: 2 %
Eosinophils Absolute: 0.1 10*3/uL (ref 0–0.7)
HEMATOCRIT: 46.3 % (ref 40.0–52.0)
Hemoglobin: 15 g/dL (ref 13.0–18.0)
LYMPHS ABS: 2 10*3/uL (ref 1.0–3.6)
LYMPHS PCT: 37 %
MCH: 27.6 pg (ref 26.0–34.0)
MCHC: 32.4 g/dL (ref 32.0–36.0)
MCV: 85 fL (ref 80.0–100.0)
MONO ABS: 0.4 10*3/uL (ref 0.2–1.0)
Monocytes Relative: 6 %
Neutro Abs: 2.9 10*3/uL (ref 1.4–6.5)
Neutrophils Relative %: 54 %
Platelets: 204 10*3/uL (ref 150–440)
RBC: 5.45 MIL/uL (ref 4.40–5.90)
RDW: 15.8 % — AB (ref 11.5–14.5)
WBC: 5.5 10*3/uL (ref 3.8–10.6)

## 2014-10-05 LAB — TROPONIN I

## 2014-10-05 MED ORDER — IOHEXOL 300 MG/ML  SOLN
100.0000 mL | Freq: Once | INTRAMUSCULAR | Status: AC | PRN
  Administered 2014-10-05: 100 mL via INTRAVENOUS

## 2014-10-05 MED ORDER — ONDANSETRON 8 MG PO TBDP
8.0000 mg | ORAL_TABLET | Freq: Once | ORAL | Status: AC
  Administered 2014-10-05: 8 mg via ORAL

## 2014-10-05 MED ORDER — ONDANSETRON 8 MG PO TBDP
ORAL_TABLET | ORAL | Status: AC
  Filled 2014-10-05: qty 1

## 2014-10-05 MED ORDER — IOHEXOL 240 MG/ML SOLN
25.0000 mL | Freq: Once | INTRAMUSCULAR | Status: AC | PRN
  Administered 2014-10-05: 25 mL via ORAL

## 2014-10-05 NOTE — Discharge Instructions (Signed)
Abdominal Pain Many things can cause abdominal pain. Usually, abdominal pain is not caused by a disease and will improve without treatment. It can often be observed and treated at home. Your health care provider will do a physical exam and possibly order blood tests and X-rays to help determine the seriousness of your pain. However, in many cases, more time must pass before a clear cause of the pain can be found. Before that point, your health care provider may not know if you need more testing or further treatment. HOME CARE INSTRUCTIONS  Monitor your abdominal pain for any changes. The following actions may help to alleviate any discomfort you are experiencing:  Only take over-the-counter or prescription medicines as directed by your health care provider.  Do not take laxatives unless directed to do so by your health care provider.  Try a clear liquid diet (broth, tea, or water) as directed by your health care provider. Slowly move to a bland diet as tolerated. SEEK MEDICAL CARE IF:  You have unexplained abdominal pain.  You have abdominal pain associated with nausea or diarrhea.  You have pain when you urinate or have a bowel movement.  You experience abdominal pain that wakes you in the night.  You have abdominal pain that is worsened or improved by eating food.  You have abdominal pain that is worsened with eating fatty foods.  You have a fever. SEEK IMMEDIATE MEDICAL CARE IF:   Your pain does not go away within 2 hours.  You keep throwing up (vomiting).  Your pain is felt only in portions of the abdomen, such as the right side or the left lower portion of the abdomen.  You pass bloody or black tarry stools. MAKE SURE YOU:  Understand these instructions.   Will watch your condition.   Will get help right away if you are not doing well or get worse.  Document Released: 02/24/2005 Document Revised: 05/22/2013 Document Reviewed: 01/24/2013 [facility] Patient Information  2015 [place] [place]. This information is not intended to replace advice given to you by your health care provider. Make sure you discuss any questions you have with your health care provider. Try some peptobismal for the cramps. Remember it will make your stool black.  Return for any fever, worse pain, not better in 2-3 days or if you are unable to keep down fluids or feel sicker.  Follow up with your doctor or see [facility] or [facility] or [facility] or [facility] or headache orally [facility].

## 2014-10-05 NOTE — ED Provider Notes (Signed)
[facility] Emergency Department Provider Note    ____________________________________________  Time seen: 200  I have reviewed the triage vital signs and the nursing notes.   HISTORY  Chief Complaint Abdominal Pain       HPI Kevin Holden is a 64 y.o. male patient reports some nausea and one or 2 episodes of vomiting slight diarrhea and crampy abdominal pain for the last day or 2 as taken milk of magnesia and may go just a little bit help abdominal pain at all, pain is diffuse mild to moderate in nature crampy is not appear to have taken any new meds or done any new activities or different food or anything like that nothing really seems to make it better or worse either    Past Medical History  Diagnosis Date  . Asthma   . COPD (chronic obstructive pulmonary disease)   . Pneumonia   . HTN (hypertension)     Patient Active Problem List   Diagnosis Date Noted  . CAP (community acquired pneumonia) 11/10/2011  . Hypoxia 11/10/2011  . Lung nodule 11/10/2011  . Current every day smoker 11/10/2011  . COPD (chronic obstructive pulmonary disease) 11/10/2011  . Benign hypertension 11/10/2011    Past Surgical History  Procedure Laterality Date  . Abdominal surgery      Current Outpatient Rx  Name  Route  Sig  Dispense  Refill  . EXPIRED: albuterol (PROVENTIL HFA;VENTOLIN HFA) 108 (90 BASE) MCG/ACT inhaler   Inhalation   Inhale 2 puffs into the lungs every 6 (six) hours as needed for wheezing.   1 Inhaler   0   . budesonide-formoterol (SYMBICORT) 80-4.5 MCG/ACT inhaler   Inhalation   Inhale 2 puffs into the lungs 2 (two) times daily.         Marland Kitchen. gabapentin (NEURONTIN) 300 MG capsule   Oral   Take 300 mg by mouth 3 (three) times daily.         . isosorbide dinitrate (ISORDIL) 10 MG tablet   Oral   Take 10 mg by mouth 3 (three) times daily.         Marland Kitchen. loratadine (CLARITIN) 10 MG tablet   Oral   Take 10 mg by mouth daily.         Marland Kitchen.  omeprazole (PRILOSEC) 20 MG capsule   Oral   Take 20 mg by mouth daily.         Marland Kitchen. oxyCODONE-acetaminophen (PERCOCET) 5-325 MG per tablet   Oral   Take 1 tablet by mouth every 8 (eight) hours as needed. Pain         . Vitamin D, Ergocalciferol, (DRISDOL) 50000 UNITS CAPS   Oral   Take 50,000 Units by mouth every 14 (fourteen) days.            Allergies Aspirin  History reviewed. No pertinent family history.  Social History History  Substance Use Topics  . Smoking status: Current Every Day Smoker -- 1.00 packs/day    Types: Cigarettes  . Smokeless tobacco: Not on file  . Alcohol Use: No    Review of Systems Constitutional: Negative for fever. Eyes: Negative for visual changes. ENT: Negative for sore throat. Cardiovascular: Negative for chest pain. Respiratory: Negative for shortness of breath. Musculoskeletal: Negative for back pain. Skin: Negative for rash. Neurological: Negative for headaches, focal weakness or numbness.   10-point ROS otherwise negative.  ____________________________________________   PHYSICAL EXAM:  VITAL SIGNS: ED Triage Vitals  Enc Vitals Group  BP 10/05/14 1001 131/89 mmHg     Pulse Rate 10/05/14 1001 98     Resp 10/05/14 1001 20     Temp 10/05/14 1001 97.5 F (36.4 C)     Temp Source 10/05/14 1001 Oral     SpO2 10/05/14 1001 92 %     Weight 10/05/14 1001 130 lb (58.968 kg)     Height 10/05/14 1001 6' (1.829 m)     Head Cir --      Peak Flow --      Pain Score 10/05/14 1002 8     Pain Loc --      Pain Edu? --      Excl. in GC? --      Constitutional: Alert and oriented. Well appearing and in no distress. Eyes: Conjunctivae are normal. PERRL. Normal extraocular movements. ENT   Head: Normocephalic and atraumatic.   Nose: No congestion/rhinnorhea.   Mouth/Throat: Mucous membranes are moist. Patient has very few teeth   Neck: No stridor. Hematological/Lymphatic/Immunilogical: No cervical  lymphadenopathy. Cardiovascular: Normal rate, regular rhythm. Normal and symmetric distal pulses are present in all extremities. No murmurs, rubs, or gallops. Respiratory: Normal respiratory effort without tachypnea nor retractions. Breath sounds are clear and equal bilaterally. No wheezes/rales/rhonchi. Gastrointestinal: Soft mildly diffusely tender all sounds are positive but slightly decreased Musculoskeletal: Nontender with normal range of motion in all extremities. No joint effusions.  No lower extremity tenderness nor edema. Neurologic:  Normal speech and language. No gross focal neurologic deficits are appreciated. Speech is normal. No gait instability. Skin:  Skin is warm, dry and intact. No rash noted. Psychiatric: Mood and affect are normal. Speech and behavior are normal. Patient exhibits appropriate insight and judgment.  ____________________________________________    LABS (pertinent positives/negatives)    ____________________________________________   EKG  EKG is normal sinus rhythm at a rate of 98 normal axis essentially normal  ____________________________________________    RADIOLOGY   ____________________________________________   PROCEDURES  Procedure(s) performed: None  Critical Care performed: No  ____________________________________________   INITIAL IMPRESSION / ASSESSMENT AND PLAN / ED COURSE  Pertinent labs & imaging results that were available during my care of the patient were reviewed by me and considered in my medical decision making (see chart for details).    ____________________________________________   FINAL CLINICAL IMPRESSION(S) / ED DIAGNOSES  Final diagnoses:  Abdominal pain, unspecified abdominal location    Arnaldo NatalPaul F Evelynne Spiers, MD 10/09/14 1552

## 2014-10-05 NOTE — ED Notes (Signed)
Patient presents to the ED with abdominal pain and cramping since Wednesday.  Patient reports nausea and vomiting x 2 days.  Patient reports vomiting x 3 in the past 24 hours.  Patient reports taking phillips milk of magnesia 2 evenings ago to help the pain and after taking that patient reports occasional diarrhea.  Patient is sitting comfortably in triage, no grimacing.  Patient reports pain above umbilicus that is tender on palpation.

## 2014-10-05 NOTE — ED Notes (Signed)
Pt to Ct at this time.

## 2014-10-05 NOTE — ED Notes (Signed)
Pt returned from ct at this time

## 2015-09-16 ENCOUNTER — Encounter: Payer: Self-pay | Admitting: Emergency Medicine

## 2015-09-16 ENCOUNTER — Emergency Department: Payer: Medicare Other

## 2015-09-16 ENCOUNTER — Emergency Department
Admission: EM | Admit: 2015-09-16 | Discharge: 2015-09-16 | Disposition: A | Discharge status: Home / Self Care | Payer: Medicare Other | Attending: Emergency Medicine | Admitting: Emergency Medicine

## 2015-09-16 DIAGNOSIS — J45909 Unspecified asthma, uncomplicated: Secondary | ICD-10-CM | POA: Diagnosis not present

## 2015-09-16 DIAGNOSIS — F17210 Nicotine dependence, cigarettes, uncomplicated: Secondary | ICD-10-CM | POA: Insufficient documentation

## 2015-09-16 DIAGNOSIS — Z8701 Personal history of pneumonia (recurrent): Secondary | ICD-10-CM | POA: Diagnosis not present

## 2015-09-16 DIAGNOSIS — I10 Essential (primary) hypertension: Secondary | ICD-10-CM | POA: Diagnosis not present

## 2015-09-16 DIAGNOSIS — Z79899 Other long term (current) drug therapy: Secondary | ICD-10-CM | POA: Diagnosis not present

## 2015-09-16 DIAGNOSIS — J441 Chronic obstructive pulmonary disease with (acute) exacerbation: Secondary | ICD-10-CM | POA: Diagnosis not present

## 2015-09-16 DIAGNOSIS — R0602 Shortness of breath: Secondary | ICD-10-CM | POA: Diagnosis present

## 2015-09-16 DIAGNOSIS — Z888 Allergy status to other drugs, medicaments and biological substances status: Secondary | ICD-10-CM | POA: Insufficient documentation

## 2015-09-16 LAB — COMPREHENSIVE METABOLIC PANEL
ALK PHOS: 85 U/L (ref 38–126)
ALT: 11 U/L — ABNORMAL LOW (ref 17–63)
ANION GAP: 5 (ref 5–15)
AST: 24 U/L (ref 15–41)
Albumin: 4 g/dL (ref 3.5–5.0)
BUN: 16 mg/dL (ref 6–20)
CO2: 26 mmol/L (ref 22–32)
Calcium: 9.1 mg/dL (ref 8.9–10.3)
Chloride: 104 mmol/L (ref 101–111)
Creatinine, Ser: 1.29 mg/dL — ABNORMAL HIGH (ref 0.61–1.24)
GFR, EST NON AFRICAN AMERICAN: 57 mL/min — AB (ref >60)
Glucose, Bld: 120 mg/dL — ABNORMAL HIGH (ref 65–99)
Potassium: 3.6 mmol/L (ref 3.5–5.1)
Sodium: 135 mmol/L (ref 135–145)
Total Bilirubin: 0.4 mg/dL (ref 0.3–1.2)
Total Protein: 7.4 g/dL (ref 6.5–8.1)

## 2015-09-16 LAB — CBC
HEMATOCRIT: 42.8 % (ref 40.0–52.0)
Hemoglobin: 14.4 g/dL (ref 13.0–18.0)
MCH: 27.6 pg (ref 26.0–34.0)
MCHC: 33.7 g/dL (ref 32.0–36.0)
MCV: 81.9 fL (ref 80.0–100.0)
PLATELETS: 181 10*3/uL (ref 150–440)
RBC: 5.23 MIL/uL (ref 4.40–5.90)
RDW: 15.8 % — ABNORMAL HIGH (ref 11.5–14.5)
WBC: 7.6 10*3/uL (ref 3.8–10.6)

## 2015-09-16 LAB — BRAIN NATRIURETIC PEPTIDE: B Natriuretic Peptide: 51 pg/mL (ref 0.0–100.0)

## 2015-09-16 LAB — TROPONIN I: Troponin I: <0.03 ng/mL (ref <0.031)

## 2015-09-16 MED ORDER — AZITHROMYCIN 250 MG PO TABS: ORAL_TABLET | ORAL | Status: DC

## 2015-09-16 MED ORDER — ALBUTEROL SULFATE (2.5 MG/3ML) 0.083% IN NEBU
5.0000 mg | INHALATION_SOLUTION | Freq: Once | RESPIRATORY_TRACT | Status: AC
  Administered 2015-09-16: 5 mg via RESPIRATORY_TRACT
  Filled 2015-09-16: qty 6

## 2015-09-16 MED ORDER — PREDNISONE 20 MG PO TABS: 40.0000 mg | ORAL_TABLET | Freq: Every day | ORAL | Status: DC

## 2015-09-16 NOTE — ED Notes (Signed)
[facility]. Pt was on his was here to be seen but decided to call EMS. Pt c/o SOB. When EMS arrived to pick him up O2 sats were 98%, but pt had wheezing. EMS gave pt one duoneb, O2 sats after 100%. Pt reports hx of asthma. Pt arrives to ER in NAD, able to talk in complete sentences.

## 2015-09-16 NOTE — Discharge Instructions (Signed)

## 2015-09-16 NOTE — ED Provider Notes (Signed)
[facility] Emergency Department Provider Note  ____________________________________________  Time seen: 6:20 AM  I have reviewed the triage vital signs and the nursing notes.   HISTORY  Chief Complaint Shortness of Breath    HPI Kevin Holden is a 65 y.o. male who complains of shortness of breath. He has a history of COPD. He has an albuterol inhaler that he uses at home which she states is not empty. He was at the [facility] when his breathing worsened and he called EMS.He has had a nonproductive cough. No chest pain. No dizziness or syncope. The wheezing by EMS with an oxygen saturation of 98% on room air, given a DuoNeb en route to the ED.     Past Medical History  Diagnosis Date  . Asthma   . COPD (chronic obstructive pulmonary disease) (HCC)   . Pneumonia   . HTN (hypertension)      Patient Active Problem List   Diagnosis Date Noted  . CAP (community acquired pneumonia) 11/10/2011  . Hypoxia 11/10/2011  . Lung nodule 11/10/2011  . Current every day smoker 11/10/2011  . COPD (chronic obstructive pulmonary disease) (HCC) 11/10/2011  . Benign hypertension 11/10/2011     Past Surgical History  Procedure Laterality Date  . Abdominal surgery       Current Outpatient Rx  Name  Route  Sig  Dispense  Refill  . albuterol (PROVENTIL HFA;VENTOLIN HFA) 108 (90 BASE) MCG/ACT inhaler   Inhalation   Inhale 2 puffs into the lungs every 6 (six) hours as needed for wheezing.   1 Inhaler   0   . gabapentin (NEURONTIN) 300 MG capsule   Oral   Take 300 mg by mouth 3 (three) times daily.         . isosorbide dinitrate (ISORDIL) 10 MG tablet   Oral   Take 10 mg by mouth 3 (three) times daily.         Marland Kitchen loratadine (CLARITIN) 10 MG tablet   Oral   Take 10 mg by mouth daily.         Marland Kitchen omeprazole (PRILOSEC) 20 MG capsule   Oral   Take 20 mg by mouth daily.         Marland Kitchen oxyCODONE-acetaminophen (PERCOCET) 5-325 MG per tablet   Oral    Take 1 tablet by mouth every 8 (eight) hours as needed. Pain         . Vitamin D, Ergocalciferol, (DRISDOL) 50000 UNITS CAPS   Oral   Take 50,000 Units by mouth every 14 (fourteen) days.          Marland Kitchen azithromycin (ZITHROMAX Z-PAK) 250 MG tablet      Take 2 tablets (500 mg) on  Day 1,  followed by 1 tablet (250 mg) once daily on Days 2 through 5.   6 each   0   . predniSONE (DELTASONE) 20 MG tablet   Oral   Take 2 tablets (40 mg total) by mouth daily.   8 tablet   0      Allergies Aspirin   History reviewed. No pertinent family history.  Social History Social History  Substance Use Topics  . Smoking status: Current Every Day Smoker -- 1.00 packs/day    Types: Cigarettes  . Smokeless tobacco: None  . Alcohol Use: No    Review of Systems  Constitutional:   No fever or chills.  Eyes:   No vision changes.  ENT:   No sore throat. No rhinorrhea. Cardiovascular:  No chest pain. Respiratory:   Positive shortness of breath and nonproductive cough Gastrointestinal:   Negative for abdominal pain, vomiting and diarrhea.  No bloody stool. Genitourinary:   Negative for dysuria or difficulty urinating. Musculoskeletal:   Negative for focal pain or swelling Neurological:   Negative for headaches 10-point ROS otherwise negative.  ____________________________________________   PHYSICAL EXAM:  VITAL SIGNS: ED Triage Vitals  Enc Vitals Group     BP 09/16/15 0409 128/79 mmHg     Pulse Rate 09/16/15 0409 83     Resp 09/16/15 0409 18     Temp 09/16/15 0409 97.7 F (36.5 C)     Temp Source 09/16/15 0409 Oral     SpO2 09/16/15 0409 95 %     Weight 09/16/15 0409 129 lb (58.514 kg)     Height 09/16/15 0409 6' (1.829 m)     Head Cir --      Peak Flow --      Pain Score 09/16/15 0412 0     Pain Loc --      Pain Edu? --      Excl. in GC? --     Vital signs reviewed, nursing assessments reviewed.   Constitutional:   Alert and oriented. Well appearing and in no  distress. Eyes:   No scleral icterus. No conjunctival pallor. PERRL. EOMI ENT   Head:   Normocephalic and atraumatic.   Nose:   No congestion/rhinnorhea. No septal hematoma   Mouth/Throat:   MMM, no pharyngeal erythema. No peritonsillar mass.Diffuse dental decay    Neck:   No stridor. No SubQ emphysema. No meningismus. Hematological/Lymphatic/Immunilogical:   No cervical lymphadenopathy. Cardiovascular:   RRR. Symmetric bilateral radial and DP pulses.  No murmurs.  Respiratory:   Normal respiratory effort without tachypnea nor retractions. Breath sounds are clear and equal bilaterally. Slight end expiratory wheezing Gastrointestinal:   Soft and nontender. Non distended. There is no CVA tenderness.  No rebound, rigidity, or guarding. Genitourinary:   deferred Musculoskeletal:   Nontender with normal range of motion in all extremities. No joint effusions.  No lower extremity tenderness.  No edema. Neurologic:   Normal speech and language.  CN 2-10 normal. Motor grossly intact. No gross focal neurologic deficits are appreciated.  Skin:    Skin is warm, dry and intact. No rash noted.  No petechiae, purpura, or bullae.  ____________________________________________    LABS (pertinent positives/negatives) (all labs ordered are listed, but only abnormal results are displayed) Labs Reviewed  COMPREHENSIVE METABOLIC PANEL - Abnormal; Notable for the following:    Glucose, Bld 120 (*)    Creatinine, Ser 1.29 (*)    ALT 11 (*)    GFR calc non Af Amer 57 (*)    All other components within normal limits  CBC - Abnormal; Notable for the following:    RDW 15.8 (*)    All other components within normal limits  BRAIN NATRIURETIC PEPTIDE  TROPONIN I   ____________________________________________   EKG  Interpreted by me Sinus rhythm rate 82, normal axis intervals QRS ST segments and T waves. For a perforation in anterior leads.  ____________________________________________     RADIOLOGY  Chest x-ray consistent with emphysema, no pneumothorax or acute infiltrate  ____________________________________________   PROCEDURES   ____________________________________________   INITIAL IMPRESSION / ASSESSMENT AND PLAN / ED COURSE  Pertinent labs & imaging results that were available during my care of the patient were reviewed by me and considered in my medical decision making (see chart  for details).  Patient well appearing no acute distress. Presents with acute shortness of breath and wheezing in the setting of long-standing emphysema and COPD. No signs unremarkable, labs unremarkable. Not in distress. After 1 DuoNeb by EMS, exam is fairly benign and reassuring. Low suspicion for ACS PE pneumothorax. No sepsis.  Patient feeling well. Reports that he has plenty of his albuterol inhaler still. I'll start him on azithromycin and prednisone, follow up with primary care.     ____________________________________________   FINAL CLINICAL IMPRESSION(S) / ED DIAGNOSES  Final diagnoses:  COPD with acute exacerbation (HCC)       Portions of this note were generated with dragon dictation software. Dictation errors may occur despite best attempts at proofreading.   Sharman Cheek, MD 09/16/15 [phone]

## 2016-01-07 ENCOUNTER — Ambulatory Visit: Payer: Medicare Other | Attending: Internal Medicine | Admitting: Physical Therapy

## 2016-01-12 ENCOUNTER — Ambulatory Visit: Payer: Medicare Other | Admitting: Physical Therapy

## 2016-01-14 ENCOUNTER — Encounter: Payer: Medicare Other | Admitting: Physical Therapy

## 2016-05-11 ENCOUNTER — Encounter (INDEPENDENT_AMBULATORY_CARE_PROVIDER_SITE_OTHER): Payer: Self-pay

## 2016-05-11 ENCOUNTER — Encounter (INDEPENDENT_AMBULATORY_CARE_PROVIDER_SITE_OTHER): Payer: Self-pay | Admitting: Vascular Surgery

## 2016-05-11 ENCOUNTER — Other Ambulatory Visit (INDEPENDENT_AMBULATORY_CARE_PROVIDER_SITE_OTHER): Payer: Self-pay | Admitting: Vascular Surgery

## 2016-05-11 ENCOUNTER — Ambulatory Visit (INDEPENDENT_AMBULATORY_CARE_PROVIDER_SITE_OTHER): Payer: Medicare Other | Admitting: Vascular Surgery

## 2016-05-11 VITALS — BP 125/73 | HR 95 | Resp 17 | Ht 72.0 in | Wt 124.0 lb

## 2016-05-11 DIAGNOSIS — I10 Essential (primary) hypertension: Secondary | ICD-10-CM | POA: Diagnosis not present

## 2016-05-11 DIAGNOSIS — L97513 Non-pressure chronic ulcer of other part of right foot with necrosis of muscle: Secondary | ICD-10-CM

## 2016-05-11 DIAGNOSIS — I7025 Atherosclerosis of native arteries of other extremities with ulceration: Secondary | ICD-10-CM

## 2016-05-11 DIAGNOSIS — F17200 Nicotine dependence, unspecified, uncomplicated: Secondary | ICD-10-CM | POA: Diagnosis not present

## 2016-05-11 NOTE — Assessment & Plan Note (Signed)
This clearly represents a critical and limb threatening situation. The patient had not been seen in some time, but the last time he was checked the artery had a drop in his right ABI from his original post procedure study. This is clearly a critical and limb threatening situation with a high risk of limb loss. I discussed the gravity of the situation with the patient who seems to voices understanding. I recommended an angiogram be performed in the near future at his convenience to attempt revascularization. I discussed that even with revascularization, limb loss as possible. The patient voices his understanding.

## 2016-05-11 NOTE — Assessment & Plan Note (Signed)
blood pressure control important in reducing the progression of atherosclerotic disease. On appropriate oral medications.  

## 2016-05-11 NOTE — Assessment & Plan Note (Signed)
Tobacco increases the risk of atherosclerotic peripheral arterial disease. Cessation would be of benefit.

## 2016-05-11 NOTE — Progress Notes (Signed)
Patient ID: Kevin Holden, male   DOB: [birth year], 65 y.o.   MRN: [id number]  Chief Complaint  Patient presents with  . Re-evaluation    Right leg/toe ulcer    HPI Kevin Holden is a 65 y.o. male.  I am asked to see the patient by Dr. Richarda BladeAdamo for evaluation of a worsening ulceration of the RLE.  The patient reports Several weeks of worsening ulceration of his right foot. The patient has an extensive vascular history and has undergone revascularization in the past. He has severe claudication symptoms that are also worsening. This ulceration is painful. He reports no fever or chills or signs of systemic toxicity. There was no trauma or injury or inciting event that caused the ulcer. At last check of his ABI which was over a year ago, he had had a drop in his ABI on the right but had not returned for follow-up visits.   Past Medical History:  Diagnosis Date  . Asthma   . COPD (chronic obstructive pulmonary disease) (HCC)   . HTN (hypertension)   . Pneumonia     Past Surgical History:  Procedure Laterality Date  . ABDOMINAL SURGERY      Family History: No bleeding disorders, clotting disorders, autoimmune diseases, or aneurysms  Social History Social History  Substance Use Topics  . Smoking status: Current Every Day Smoker    Packs/day: 1.00    Types: Cigarettes  . Smokeless tobacco: Not on file  . Alcohol use No  No IVDU  Allergies  Allergen Reactions  . Aspirin Other (See Comments)    Unknown  . Celecoxib     Other reaction(s): Unknown  . Nsaids     Other reaction(s): Unknown    Current Outpatient Prescriptions  Medication Sig Dispense Refill  . budesonide-formoterol (SYMBICORT) 160-4.5 MCG/ACT inhaler Inhale into the lungs.    . gabapentin (NEURONTIN) 300 MG capsule Take 300 mg by mouth 3 (three) times daily.    . isosorbide dinitrate (ISORDIL) 10 MG tablet Take 10 mg by mouth 3 (three) times daily.    Marland Kitchen. loratadine (CLARITIN) 10 MG tablet Take by mouth.    .  metoprolol (TOPROL-XL) 200 MG 24 hr tablet Take by mouth.    Marland Kitchen. omeprazole (PRILOSEC) 20 MG capsule Take 20 mg by mouth daily.    Marland Kitchen. oxyCODONE-acetaminophen (PERCOCET) 5-325 MG per tablet Take 1 tablet by mouth every 8 (eight) hours as needed. Pain    . predniSONE (DELTASONE) 20 MG tablet Take 2 tablets (40 mg total) by mouth daily. 8 tablet 0  . Vitamin D, Ergocalciferol, (DRISDOL) 50000 UNITS CAPS Take 50,000 Units by mouth every 14 (fourteen) days.     Marland Kitchen. albuterol (PROVENTIL HFA;VENTOLIN HFA) 108 (90 BASE) MCG/ACT inhaler Inhale 2 puffs into the lungs every 6 (six) hours as needed for wheezing. 1 Inhaler 0   No current facility-administered medications for this visit.       REVIEW OF SYSTEMS (Negative unless checked)  Constitutional: [] Weight loss  [] Fever  [] Chills Cardiac: [] Chest pain   [] Chest pressure   [] Palpitations   [] Shortness of breath when laying flat   [] Shortness of breath at rest   [] Shortness of breath with exertion. Vascular:  [x] Pain in legs with walking   [] Pain in legs at rest   [] Pain in legs when laying flat   [x] Claudication   [] Pain in feet when walking  [] Pain in feet at rest  [] Pain in feet when laying flat   [] History of DVT   []   Phlebitis   [] Swelling in legs   [] Varicose veins   [x] Non-healing ulcers Pulmonary:   [] Uses home oxygen   [] Productive cough   [] Hemoptysis   [] Wheeze  [x] COPD   [] Asthma Neurologic:  [] Dizziness  [] Blackouts   [] Seizures   [] History of stroke   [] History of TIA  [] Aphasia   [] Temporary blindness   [] Dysphagia   [] Weakness or numbness in arms   [] Weakness or numbness in legs Musculoskeletal:  [] Arthritis   [] Joint swelling   [] Joint pain   [] Low back pain Hematologic:  [] Easy bruising  [] Easy bleeding   [] Hypercoagulable state   [] Anemic  [] Hepatitis Gastrointestinal:  [] Blood in stool   [] Vomiting blood  [] Gastroesophageal reflux/heartburn   [] Abdominal pain Genitourinary:  [] Chronic kidney disease   [] Difficult urination  [] Frequent  urination  [] Burning with urination   [] Hematuria Skin:  [] Rashes   [x] Ulcers   [x] Wounds Psychological:  [] History of anxiety   []  History of major depression.    Physical Exam BP 125/73 (BP Location: Right Arm)   Pulse 95   Resp 17   Ht 6' (1.829 m)   Wt 56.2 kg (124 lb)   BMI 16.82 kg/m  Gen: Thin, almost cachectic African-American male not in apparent distress. Head: [place]/AT, + temporalis wasting. Prominent temp pulse not noted. Ear/Nose/Throat: Hearing grossly intact, nares w/o erythema or drainage, oropharynx w/o Erythema/Exudate Eyes: Conjunctiva clear, sclera non-icteric  Neck: trachea midline.  No JVD.  Pulmonary:  Good air movement, clear to auscultation bilaterally.  Cardiac: RRR, normal S1, S2 Vascular:  Vessel Right Left  Radial Palpable Palpable  Ulnar Palpable Palpable  Brachial Palpable Palpable  Carotid Palpable, without bruit Palpable, without bruit  Aorta Not palpable N/A  Femoral Palpable Palpable  Popliteal Not Palpable 1+ Palpable  PT Not Palpable 1+ Palpable  DP Not Palpable Not Palpable   Gastrointestinal: soft, non-tender/non-distended. No guarding/reflex. No masses, surgical incisions, or scars. Musculoskeletal: M/S 5/5 throughout. No deformity or atrophy. No edema. 2-3 cm ulceration with necrotic base on the medial first metatarsal head on the right Neurologic: Sensation grossly intact in extremities.  Symmetrical.  Speech is fluent. Motor exam as listed above. Psychiatric: Judgment intact, Mood & affect appropriate for pt's clinical situation. Dermatologic: Wound on right foot as above Lymph : No Cervical, Axillary, or Inguinal lymphadenopathy.   Radiology No results found.  Labs No results found for this or any previous visit (from the past 2160 hour(s)).  Assessment/Plan:  Benign hypertension blood pressure control important in reducing the progression of atherosclerotic disease. On appropriate oral medications.   Current every day  smoker Tobacco increases the risk of atherosclerotic peripheral arterial disease. Cessation would be of benefit.  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 Revascularization planned.  Atherosclerosis of native arteries of the extremities with ulceration (HCC) This clearly represents a critical and limb threatening situation. The patient had not been seen in some time, but the last time he was checked the artery had a drop in his right ABI from his original post procedure study. This is clearly a critical and limb threatening situation with a high risk of limb loss. I discussed the gravity of the situation with the patient who seems to voices understanding. I recommended an angiogram be performed in the near future at his convenience to attempt revascularization. I discussed that even with revascularization, limb loss as possible. The patient voices his understanding.      Festus BarrenJason Dew 05/11/2016, 1:23 PM  This note was created with Dragon medical transcription system.  Any errors from dictation are unintentional.

## 2016-05-11 NOTE — Assessment & Plan Note (Signed)
Continue local wound care. Revascularization planned.

## 2016-05-11 NOTE — Patient Instructions (Signed)
Peripheral Vascular Disease Peripheral vascular disease (PVD) is a disease of the blood vessels that are not part of your heart and brain. A simple term for PVD is poor circulation. In most cases, PVD narrows the blood vessels that carry blood from your heart to the rest of your body. This can result in a decreased supply of blood to your arms, legs, and internal organs, like your stomach or kidneys. However, it most often affects a person's lower legs and feet. There are two types of PVD.  Organic PVD. This is the more common type. It is caused by damage to the structure of blood vessels.  Functional PVD. This is caused by conditions that make blood vessels contract and tighten (spasm).  Without treatment, PVD tends to get worse over time. PVD can also lead to acute ischemic limb. This is when an arm or limb suddenly has trouble getting enough blood. This is a medical emergency. What are the causes? Each type of PVD has many different causes. The most common cause of PVD is buildup of a fatty material (plaque) inside of your arteries (atherosclerosis). Small amounts of plaque can break off from the walls of the blood vessels and become lodged in a smaller artery. This blocks blood flow and can cause acute ischemic limb. Other common causes of PVD include:  Blood clots that form inside of blood vessels.  Injuries to blood vessels.  Diseases that cause inflammation of blood vessels or cause blood vessel spasms.  Health behaviors and health history that increase your risk of developing PVD.  What increases the risk? You may have a greater risk of PVD if you:  Have a family history of PVD.  Have certain medical conditions, including: ? High cholesterol. ? Diabetes. ? High blood pressure (hypertension). ? Coronary heart disease. ? Past problems with blood clots. ? Past injury, such as burns or a broken bone. These may have damaged blood vessels in your limbs. ? Buerger disease. This is  caused by inflamed blood vessels in your hands and feet. ? Some forms of arthritis. ? Rare birth defects that affect the arteries in your legs.  Use tobacco.  Do not get enough exercise.  Are obese.  Are age 50 or older.  What are the signs or symptoms? PVD may cause many different symptoms. Your symptoms depend on what part of your body is not getting enough blood. Some common signs and symptoms include:  Cramps in your lower legs. This may be a symptom of poor leg circulation (claudication).  Pain and weakness in your legs while you are physically active that goes away when you rest (intermittent claudication).  Leg pain when at rest.  Leg numbness, tingling, or weakness.  Coldness in a leg or foot, especially when compared with the other leg.  Skin or hair changes. These can include: ? Hair loss. ? Shiny skin. ? Pale or bluish skin. ? Thick toenails.  Inability to get or maintain an erection (erectile dysfunction).  People with PVD are more prone to developing ulcers and sores on their toes, feet, or legs. These may take longer than normal to heal. How is this diagnosed? Your health care provider may diagnose PVD from your signs and symptoms. The health care provider will also do a physical exam. You may have tests to find out what is causing your PVD and determine its severity. Tests may include:  Blood pressure recordings from your arms and legs and measurements of the strength of your pulses (  pulse volume recordings).  Imaging studies using sound waves to take pictures of the blood flow through your blood vessels (Doppler ultrasound).  Injecting a dye into your blood vessels before having imaging studies using: ? X-rays (angiogram or arteriogram). ? Computer-generated X-rays (CT angiogram). ? A powerful electromagnetic field and a computer (magnetic resonance angiogram or MRA).  How is this treated? Treatment for PVD depends on the cause of your condition and the  severity of your symptoms. It also depends on your age. Underlying causes need to be treated and controlled. These include long-lasting (chronic) conditions, such as diabetes, high cholesterol, and high blood pressure. You may need to first try making lifestyle changes and taking medicines. Surgery may be needed if these do not work. Lifestyle changes may include:  Quitting smoking.  Exercising regularly.  Following a low-fat, low-cholesterol diet.  Medicines may include:  Blood thinners to prevent blood clots.  Medicines to improve blood flow.  Medicines to improve your blood cholesterol levels.  Surgical procedures may include:  A procedure that uses an inflated balloon to open a blocked artery and improve blood flow (angioplasty).  A procedure to put in a tube (stent) to keep a blocked artery open (stent implant).  Surgery to reroute blood flow around a blocked artery (peripheral bypass surgery).  Surgery to remove dead tissue from an infected wound on the affected limb.  Amputation. This is surgical removal of the affected limb. This may be necessary in cases of acute ischemic limb that are not improved through medical or surgical treatments.  Follow these instructions at home:  Take medicines only as directed by your health care provider.  Do not use any tobacco products, including cigarettes, chewing tobacco, or electronic cigarettes. If you need help quitting, ask your health care provider.  Lose weight if you are overweight, and maintain a healthy weight as directed by your health care provider.  Eat a diet that is low in fat and cholesterol. If you need help, ask your health care provider.  Exercise regularly. Ask your health care provider to suggest some good activities for you.  Use compression stockings or other mechanical devices as directed by your health care provider.  Take good care of your feet. ? Wear comfortable shoes that fit well. ? Check your feet  often for any cuts or sores. Contact a health care provider if:  You have cramps in your legs while walking.  You have leg pain when you are at rest.  You have coldness in a leg or foot.  Your skin changes.  You have erectile dysfunction.  You have cuts or sores on your feet that are not healing. Get help right away if:  Your arm or leg turns cold and blue.  Your arms or legs become red, warm, swollen, painful, or numb.  You have chest pain or trouble breathing.  You suddenly have weakness in your face, arm, or leg.  You become very confused or lose the ability to speak.  You suddenly have a very bad headache or lose your vision. This information is not intended to replace advice given to you by your health care provider. Make sure you discuss any questions you have with your health care provider. Document Released: 06/24/2004 Document Revised: 10/23/2015 Document Reviewed: 10/25/2013 Elsevier Interactive Patient Education  2017 [employer]  

## 2016-05-25 ENCOUNTER — Inpatient Hospital Stay: Admission: RE | Admit: 2016-05-25 | Payer: Medicare Other | Source: Ambulatory Visit

## 2016-05-26 ENCOUNTER — Encounter
Admission: RE | Admit: 2016-05-26 | Discharge: 2016-05-26 | Disposition: A | Discharge status: Home / Self Care | Payer: Medicare Other | Source: Ambulatory Visit | Attending: Vascular Surgery | Admitting: Vascular Surgery

## 2016-05-26 DIAGNOSIS — F17210 Nicotine dependence, cigarettes, uncomplicated: Secondary | ICD-10-CM | POA: Diagnosis not present

## 2016-05-26 DIAGNOSIS — I70235 Atherosclerosis of native arteries of right leg with ulceration of other part of foot: Secondary | ICD-10-CM | POA: Diagnosis present

## 2016-05-26 DIAGNOSIS — L97519 Non-pressure chronic ulcer of other part of right foot with unspecified severity: Secondary | ICD-10-CM | POA: Diagnosis not present

## 2016-05-26 DIAGNOSIS — J449 Chronic obstructive pulmonary disease, unspecified: Secondary | ICD-10-CM | POA: Diagnosis not present

## 2016-05-26 DIAGNOSIS — I10 Essential (primary) hypertension: Secondary | ICD-10-CM | POA: Diagnosis not present

## 2016-05-26 LAB — BUN: BUN: 14 mg/dL (ref 6–20)

## 2016-05-26 LAB — CREATININE, SERUM: CREATININE: 1.15 mg/dL (ref 0.61–1.24)

## 2016-05-26 MED ORDER — DEXTROSE 5 % IV SOLN: 1.5000 g | INTRAVENOUS | Status: DC

## 2016-05-26 NOTE — OR Nursing (Signed)
Pt stated that he did not have any pre procedure instructions- copy faxed from office and reviewed with patient. Pt verbalized understanding and left PAT with copy

## 2016-05-27 ENCOUNTER — Ambulatory Visit
Admission: RE | Admit: 2016-05-27 | Discharge: 2016-05-27 | Disposition: A | Discharge status: Home / Self Care | Payer: Medicare Other | Source: Ambulatory Visit | Attending: Vascular Surgery | Admitting: Vascular Surgery

## 2016-05-27 ENCOUNTER — Encounter
Admission: RE | Disposition: A | Discharge status: Home / Self Care | Payer: Self-pay | Source: Ambulatory Visit | Attending: Vascular Surgery

## 2016-05-27 DIAGNOSIS — I10 Essential (primary) hypertension: Secondary | ICD-10-CM | POA: Insufficient documentation

## 2016-05-27 DIAGNOSIS — I70235 Atherosclerosis of native arteries of right leg with ulceration of other part of foot: Secondary | ICD-10-CM | POA: Diagnosis not present

## 2016-05-27 DIAGNOSIS — I70238 Atherosclerosis of native arteries of right leg with ulceration of other part of lower right leg: Secondary | ICD-10-CM | POA: Diagnosis not present

## 2016-05-27 DIAGNOSIS — L97519 Non-pressure chronic ulcer of other part of right foot with unspecified severity: Secondary | ICD-10-CM | POA: Insufficient documentation

## 2016-05-27 DIAGNOSIS — J449 Chronic obstructive pulmonary disease, unspecified: Secondary | ICD-10-CM | POA: Insufficient documentation

## 2016-05-27 DIAGNOSIS — F17210 Nicotine dependence, cigarettes, uncomplicated: Secondary | ICD-10-CM | POA: Insufficient documentation

## 2016-05-27 HISTORY — PX: PERIPHERAL VASCULAR CATHETERIZATION: SHX172C

## 2016-05-27 SURGERY — LOWER EXTREMITY ANGIOGRAPHY
Anesthesia: Moderate Sedation | Laterality: Right

## 2016-05-27 MED ORDER — GUAIFENESIN-DM 100-10 MG/5ML PO SYRP: 15.0000 mL | ORAL_SOLUTION | ORAL | Status: DC | PRN

## 2016-05-27 MED ORDER — FENTANYL CITRATE (PF) 100 MCG/2ML IJ SOLN
INTRAMUSCULAR | Status: DC | PRN
  Administered 2016-05-27: 25 ug via INTRAVENOUS
  Administered 2016-05-27: 50 ug via INTRAVENOUS

## 2016-05-27 MED ORDER — MIDAZOLAM HCL 2 MG/2ML IJ SOLN
INTRAMUSCULAR | Status: AC
  Filled 2016-05-27: qty 4

## 2016-05-27 MED ORDER — HEPARIN (PORCINE) IN NACL 2-0.9 UNIT/ML-% IJ SOLN
INTRAMUSCULAR | Status: AC
  Filled 2016-05-27: qty 1000

## 2016-05-27 MED ORDER — FENTANYL CITRATE (PF) 100 MCG/2ML IJ SOLN
INTRAMUSCULAR | Status: AC
  Filled 2016-05-27: qty 2

## 2016-05-27 MED ORDER — HEPARIN SODIUM (PORCINE) 1000 UNIT/ML IJ SOLN
INTRAMUSCULAR | Status: AC
  Filled 2016-05-27: qty 1

## 2016-05-27 MED ORDER — ACETAMINOPHEN 325 MG PO TABS: 325.0000 mg | ORAL_TABLET | ORAL | Status: DC | PRN

## 2016-05-27 MED ORDER — ONDANSETRON HCL 4 MG/2ML IJ SOLN: 4.0000 mg | Freq: Four times a day (QID) | INTRAMUSCULAR | Status: DC | PRN

## 2016-05-27 MED ORDER — PHENOL 1.4 % MT LIQD
1.0000 | OROMUCOSAL | Status: DC | PRN
  Filled 2016-05-27: qty 177

## 2016-05-27 MED ORDER — METHYLPREDNISOLONE SODIUM SUCC 125 MG IJ SOLR
125.0000 mg | INTRAMUSCULAR | Status: DC | PRN
Start: 2016-05-27 — End: 2016-05-27

## 2016-05-27 MED ORDER — SODIUM CHLORIDE 0.9 % IV SOLN
INTRAVENOUS | Status: DC
  Administered 2016-05-27: 08:00:00 via INTRAVENOUS

## 2016-05-27 MED ORDER — CEFAZOLIN IN D5W 1 GM/50ML IV SOLN
1.0000 g | Freq: Once | INTRAVENOUS | Status: AC
  Administered 2016-05-27: 1 g via INTRAVENOUS

## 2016-05-27 MED ORDER — MIDAZOLAM HCL 2 MG/2ML IJ SOLN
INTRAMUSCULAR | Status: DC | PRN
  Administered 2016-05-27: 2 mg via INTRAVENOUS
  Administered 2016-05-27: 1 mg via INTRAVENOUS

## 2016-05-27 MED ORDER — OXYCODONE-ACETAMINOPHEN 5-325 MG PO TABS: 1.0000 | ORAL_TABLET | ORAL | Status: DC | PRN

## 2016-05-27 MED ORDER — FAMOTIDINE 20 MG PO TABS: 40.0000 mg | ORAL_TABLET | ORAL | Status: DC | PRN

## 2016-05-27 MED ORDER — LABETALOL HCL 5 MG/ML IV SOLN: 10.0000 mg | INTRAVENOUS | Status: DC | PRN

## 2016-05-27 MED ORDER — ACETAMINOPHEN 325 MG RE SUPP: 325.0000 mg | RECTAL | Status: DC | PRN

## 2016-05-27 MED ORDER — METOPROLOL TARTRATE 5 MG/5ML IV SOLN: 2.0000 mg | INTRAVENOUS | Status: DC | PRN

## 2016-05-27 MED ORDER — LIDOCAINE HCL (PF) 1 % IJ SOLN
INTRAMUSCULAR | Status: AC
  Filled 2016-05-27: qty 30

## 2016-05-27 MED ORDER — HYDROMORPHONE HCL 1 MG/ML IJ SOLN: 0.5000 mg | INTRAMUSCULAR | Status: DC | PRN

## 2016-05-27 MED ORDER — HYDROMORPHONE HCL 1 MG/ML IJ SOLN: 1.0000 mg | Freq: Once | INTRAMUSCULAR | Status: DC

## 2016-05-27 MED ORDER — IOPAMIDOL (ISOVUE-300) INJECTION 61%
INTRAVENOUS | Status: DC | PRN
  Administered 2016-05-27: 70 mL via INTRA_ARTERIAL

## 2016-05-27 MED ORDER — SODIUM CHLORIDE 0.9 % IV SOLN
500.0000 mL | Freq: Once | INTRAVENOUS | Status: DC | PRN
Start: 2016-05-27 — End: 2016-05-27

## 2016-05-27 MED ORDER — HYDRALAZINE HCL 20 MG/ML IJ SOLN: 5.0000 mg | INTRAMUSCULAR | Status: DC | PRN

## 2016-05-27 MED ORDER — HEPARIN SODIUM (PORCINE) 1000 UNIT/ML IJ SOLN
INTRAMUSCULAR | Status: DC | PRN
  Administered 2016-05-27: 4000 [IU] via INTRAVENOUS

## 2016-05-27 SURGICAL SUPPLY — 21 items
BALLN LUTONIX 5X150X130 (BALLOONS) ×5
BALLN LUTONIX DCB 5X100X130 (BALLOONS) ×5
BALLN ULTRVRSE 3X300X150 (BALLOONS) ×2
BALLN ULTRVRSE 3X300X150 OTW (BALLOONS) ×3
BALLOON LUTONIX 5X150X130 (BALLOONS) ×3 IMPLANT
BALLOON LUTONIX DCB 5X100X130 (BALLOONS) ×3 IMPLANT
BALLOON ULTRVRSE 3X300X150 OTW (BALLOONS) ×3 IMPLANT
CATH CXI SUPP ANG 4FR 135 (MICROCATHETER) ×3 IMPLANT
CATH CXI SUPP ANG 4FR 135CM (MICROCATHETER) ×5
CATH PIG 70CM (CATHETERS) ×5 IMPLANT
CATH VERT 100CM (CATHETERS) ×5 IMPLANT
DEVICE PRESTO INFLATION (MISCELLANEOUS) ×5 IMPLANT
DEVICE STARCLOSE SE CLOSURE (Vascular Products) ×5 IMPLANT
GLIDEWIRE ADV .035X260CM (WIRE) ×5 IMPLANT
GUIDEWIRE PFTE-COATED .018X300 (WIRE) ×5 IMPLANT
PACK ANGIOGRAPHY (CUSTOM PROCEDURE TRAY) ×5 IMPLANT
SHEATH ANL2 6FRX45 HC (SHEATH) ×5 IMPLANT
SHEATH BRITE TIP 5FRX11 (SHEATH) ×5 IMPLANT
SYR MEDRAD MARK V 150ML (SYRINGE) ×5 IMPLANT
TUBING CONTRAST HIGH PRESS 72 (TUBING) ×5 IMPLANT
WIRE EMERALD 3MM-J .035X150CM (WIRE) ×5 IMPLANT

## 2016-05-27 NOTE — Op Note (Signed)
[facility] Percutaneous Study/Intervention Procedural Note   Date of Surgery: 05/27/2016  Surgeon(s):Corneilus Heggie   Assistants:none  Pre-operative Diagnosis: PAD with ulceration right lower extremity  Post-operative diagnosis: Same  Procedure(s) Performed: 1. Ultrasound guidance for vascular access left femoral artery 2. Catheter placement into right anterior tibial artery from left femoral approach 3. Aortogram and selective right lower extremity angiogram 4. Percutaneous transluminal angioplasty of the right anterior tibial artery from the proximal segment down to the ankle with 3 mm diameter by 30 cm length angioplasty balloon 5. Percutaneous transluminal angioplasty of the right proximal and mid superficial femoral artery with 5 mm diameter by 15 cm length and 5 mm diameter by 10 cm length Lutonix drug-coated angioplasty balloons  6. StarClose closure device left femoral artery  EBL: Minimal  Contrast: 70 cc  Fluoro Time: 6.6 minutes  Moderate Conscious Sedation Time: approximately 45 minutes using 3 mg of Versed and 75 mcg of Fentanyl  Indications: Patient is a 65 y.o.male with nonhealing ulcerations of the right foot and previous lower extremity revascularizations. The patient has noninvasive study showing reduced right ABI. The patient is brought in for angiography for further evaluation and potential treatment. Risks and benefits are discussed and informed consent is obtained  Procedure: The patient was identified and appropriate procedural time out was performed. The patient was then placed supine on the table and prepped and draped in the usual sterile fashion.Moderate conscious sedation was administered during a face to face encounter with the patient throughout the procedure with my supervision of the RN administering medicines and monitoring the patient's vital  signs, pulse oximetry, telemetry and mental status throughout from the start of the procedure until the patient was taken to the recovery room. Ultrasound was used to evaluate the left common femoral artery. It was patent . A digital ultrasound image was acquired. A Seldinger needle was used to access the left common femoral artery under direct ultrasound guidance and a permanent image was performed. A 0.035 J wire was advanced without resistance and a 5Fr sheath was placed. Pigtail catheter was placed into the aorta and an AP aortogram was performed. This demonstrated normal renal arteries and normal aorta and iliac segments without significant stenosis. I then crossed the aortic bifurcation and advanced to the right femoral head. Selective right lower extremity angiogram was then performed. This demonstrated about a 70% stenosis at the origin of the SFA with some diffuse disease and then 2 areas of greater than 80% stenosis in the mid SFA about 8-10 cm apart. The distal SFA then had no greater than 30% stenosis. The popliteal artery had mild disease and then there was occlusion of all 3 tibial vessels. The anterior tibial artery had a proximal stump of about 5-6 cm that was patent with a long segment occlusion below this. The tibioperoneal trunk and proximal peroneal and posterior tibial arteries were not seen and appeared to be occluded but reconstitution was seen in both of these vessels distally in the lower leg. The patient was systemically heparinized and a 6 [place] [employer]sell sheath was then placed over the [employer] wire. I then used a Kumpe catheter and the advantage wire to navigate through the SFA lesions and into the proximal anterior tibial artery. Selective imaging was performed, and I then exchanged for a 0.018 advantage wire and a CXI catheter was able to easily cross the occlusion in the anterior tibial artery and confirm intraluminal flow in the foot. The 0.018 wire was then replaced  and I  proceeded with treatment. 2 inflations of a 3 mm diameter by 30 cm length angioplasty balloon were used to treat from the ankle up to the origin of the anterior tibial artery. The distal inflation was to 8 atm in the proximal inflation was to 10 atm. Both inflations were for approximately 1 minute. After angioplasty, no greater than 30% residual stenosis was identified and the anterior tibial artery with brisk flow into the foot. I then turned my attention to the superficial femoral artery. For the proximal to mid superficial femoral artery a 5 mm diameter by 15 cm length Lutonix drug-coated angioplasty balloon was inflated to 12 atm for 1 minute. For the proximal superficial femoral artery up into the common femoral artery a 5 mm diameter by 10 cm length Lutonix drug-coated angioplasty balloon was inflated to 12 atm for 1 minute. Completion angiogram showed about a 35% residual stenosis in the origin of the SFA that was not flow limiting. The remainder of the treated SFA had no greater than 25% residual stenosis. The flow was brisk and markedly improved. I elected to terminate the procedure. The sheath was removed and StarClose closure device was deployed in the left femoral artery with excellent hemostatic result. The patient was taken to the recovery room in stable condition having tolerated the procedure well.  Findings:  Aortogram: Normal renal arteries, normal aorta, normal iliac arteries with a patent right common iliac artery stent Right Lower Extremity: This demonstrated about a 70% stenosis at the origin of the SFA with some diffuse disease and then 2 areas of greater than 80% stenosis in the mid SFA about 8-10 cm apart. The distal SFA then had no greater than 30% stenosis. The popliteal artery had mild disease and then there was occlusion of all 3 tibial vessels. The anterior tibial artery had a proximal stump of about 5-6 cm that was patent with a long segment occlusion  below this. The tibioperoneal trunk and proximal peroneal and posterior tibial arteries were not seen and appeared to be occluded but reconstitution was seen in both of these vessels distally in the lower leg.   Disposition: Patient was taken to the recovery room in stable condition having tolerated the procedure well.  Complications: None  Leotis Pain 05/27/2016 9:24 AM   This note was created with Dragon Medical transcription system. Any errors in dictation are purely unintentional.

## 2016-05-27 NOTE — H&P (Signed)
[facility] History & Physical Update  The patient was interviewed and re-examined.  The patient's previous History and Physical has been reviewed and is unchanged.  There is no change in the plan of care. We plan to proceed with the scheduled procedure.  Festus BarrenJason Dew, MD  05/27/2016, 8:09 AM

## 2016-06-24 ENCOUNTER — Ambulatory Visit (INDEPENDENT_AMBULATORY_CARE_PROVIDER_SITE_OTHER): Payer: Medicare Other | Admitting: Vascular Surgery

## 2016-06-24 ENCOUNTER — Other Ambulatory Visit (INDEPENDENT_AMBULATORY_CARE_PROVIDER_SITE_OTHER): Payer: Self-pay | Admitting: Vascular Surgery

## 2016-06-24 ENCOUNTER — Other Ambulatory Visit (INDEPENDENT_AMBULATORY_CARE_PROVIDER_SITE_OTHER): Payer: Medicare Other

## 2016-06-24 ENCOUNTER — Encounter (INDEPENDENT_AMBULATORY_CARE_PROVIDER_SITE_OTHER): Payer: Self-pay | Admitting: Vascular Surgery

## 2016-06-24 VITALS — BP 141/84 | HR 84 | Resp 16 | Ht 72.0 in | Wt 129.0 lb

## 2016-06-24 DIAGNOSIS — I739 Peripheral vascular disease, unspecified: Secondary | ICD-10-CM

## 2016-06-24 DIAGNOSIS — I10 Essential (primary) hypertension: Secondary | ICD-10-CM

## 2016-06-24 DIAGNOSIS — Z48812 Encounter for surgical aftercare following surgery on the circulatory system: Secondary | ICD-10-CM

## 2016-06-24 DIAGNOSIS — F17200 Nicotine dependence, unspecified, uncomplicated: Secondary | ICD-10-CM | POA: Diagnosis not present

## 2016-06-24 NOTE — Progress Notes (Signed)
Subjective:    Patient ID: Kevin Holden, male    DOB: 1951/02/20, 66 y.o.   MRN: [id number] Chief Complaint  Patient presents with  . Re-evaluation    Ultrasound follow up   Patient presents for his first post-procedure follow up. Patient is s/p a right lower extremity angiogram on 05/27/16. He presents today without complaint. States his right lower extremity "feels better". The patient underwent an ABI which showed a significant increase in the right ankle-brachial index when compared to the previous exam, left ankle brachial index is stable. The patient denies any claudication like symptoms, rest pain or ulcers to her feet / toes.    Review of Systems  Constitutional: Negative.   HENT: Negative.   Eyes: Negative.   Respiratory: Negative.   Cardiovascular: Negative.   Gastrointestinal: Negative.   Endocrine: Negative.   Genitourinary: Negative.   Musculoskeletal: Negative.   Skin: Negative.   Allergic/Immunologic: Negative.   Neurological: Negative.   Hematological: Negative.   Psychiatric/Behavioral: Negative.       Objective:   Physical Exam  Constitutional: He is oriented to person, place, and time. He appears well-developed and well-nourished.  HENT:  Head: Normocephalic and atraumatic.  Right Ear: External ear normal.  Left Ear: External ear normal.  Eyes: Conjunctivae and EOM are normal. Pupils are equal, round, and reactive to light.  Neck: Normal range of motion.  Cardiovascular: Normal rate, regular rhythm, normal heart sounds and intact distal pulses.   Pulses:      Radial pulses are 2+ on the right side, and 2+ on the left side.       Dorsalis pedis pulses are 1+ on the right side, and 1+ on the left side.       Posterior tibial pulses are 1+ on the right side, and 1+ on the left side.  Pulmonary/Chest: Effort normal and breath sounds normal.  Abdominal: Soft. Bowel sounds are normal.  Musculoskeletal: Normal range of motion. He exhibits no edema.    Neurological: He is alert and oriented to person, place, and time.  Skin: Skin is warm and dry.  Psychiatric: He has a normal mood and affect. His behavior is normal. Judgment and thought content normal.   BP (!) 141/84 (BP Location: Right Arm)   Pulse 84   Resp 16   Ht 6' (1.829 m)   Wt 129 lb (58.5 kg)   BMI 17.50 kg/m   Past Medical History:  Diagnosis Date  . Asthma   . COPD (chronic obstructive pulmonary disease) (HCC)   . HTN (hypertension)   . Pneumonia    Social History   Social History  . Marital status: Single    Spouse name: N/A  . Number of children: N/A  . Years of education: N/A   Occupational History  . Not on file.   Social History Main Topics  . Smoking status: Current Every Day Smoker    Packs/day: 1.00    Years: 40.00    Types: Cigarettes  . Smokeless tobacco: Never Used  . Alcohol use No  . Drug use: No  . Sexual activity: No   Other Topics Concern  . Not on file   Social History Narrative  . No narrative on file   Past Surgical History:  Procedure Laterality Date  . ABDOMINAL SURGERY    . PERIPHERAL VASCULAR CATHETERIZATION Right 05/27/2016   Procedure: Lower Extremity Angiography;  Surgeon: Annice Needy, MD;  Location: [facility]VASIVE [facility];  Service: Cardiovascular;  Laterality:  Right;  Marland Kitchen. PERIPHERAL VASCULAR CATHETERIZATION N/A 05/27/2016   Procedure: Abdominal Aortogram w/Lower Extremity;  Surgeon: Annice NeedyJason S Dew, MD;  Location: [facility];  Service: Cardiovascular;  Laterality: N/A;  . PERIPHERAL VASCULAR CATHETERIZATION  05/27/2016   Procedure: Lower Extremity Intervention;  Surgeon: Annice NeedyJason S Dew, MD;  Location: [facility] INVASIVE CV [facility];  Service: Cardiovascular;;  . VASCULAR SURGERY     2016 balloon in vein in right leg   No family history on file.  Allergies  Allergen Reactions  . Aspirin Other (See Comments)    Unknown  . Celecoxib     Other reaction(s): Unknown  . Nsaids     Other reaction(s): Unknown       Assessment & Plan:  Patient presents for his first post-procedure follow up. Patient is s/p a right lower extremity angiogram on 05/27/16. He presents today without complaint. States his right lower extremity "feels better". The patient underwent an ABI which showed a significant increase in the right ankle-brachial index when compared to the previous exam, left ankle brachial index is stable. The patient denies any claudication like symptoms, rest pain or ulcers to her feet / toes.   1. PAD (peripheral artery disease) (HCC) - Improved Improved symptoms. Improved ABI's since intervention. Patient to continue medical optimization with ASA and dyslipidemia medication. I have discussed with the patient at length the risk factors for and pathogenesis of atherosclerotic disease and encouraged a healthy diet, regular exercise regimen and blood pressure / glucose control.  The patient was encouraged to call the office in the interim if he experiences any claudication like symptoms, rest pain or ulcers to his feet / toes.  - VAS US ABI WITH/WO TBI; Future - VAS US LOWER EXTREMITY ARTERIAL DUPLEX; Future  2. Current every day smoker - Stable I have discussed (approximately 5 minutes) with the patient the role of tobacco in the pathogenesis of atherosclerosis and its effect on the progression of the disease, impact on the durability of interventions and its limitations on the formation of collateral pathways. I have recommended absolute tobacco cessation. I have discussed various options available for assistance with tobacco cessation including over the counter methods (Nicotine gum, patch and lozenges). We also discussed prescription options (Chantix, Nicotine Inhaler / Nasal Spray). The patient is not interested in pursuing any prescription tobacco cessation options at this time. The patient voices their understanding.   3. Benign hypertension - Stable Encouraged good control as its slows the progression  of atherosclerotic disease  Current Outpatient Prescriptions on File Prior to Visit  Medication Sig Dispense Refill  . budesonide-formoterol (SYMBICORT) 160-4.5 MCG/ACT inhaler Inhale 2 puffs into the lungs daily.     Marland Kitchen. gabapentin (NEURONTIN) 300 MG capsule Take 300 mg by mouth 3 (three) times daily.    Marland Kitchen. loratadine (CLARITIN) 10 MG tablet Take 10 mg by mouth daily.     Marland Kitchen. omeprazole (PRILOSEC) 20 MG capsule Take 20 mg by mouth daily.    Marland Kitchen. albuterol (PROVENTIL HFA;VENTOLIN HFA) 108 (90 BASE) MCG/ACT inhaler Inhale 2 puffs into the lungs every 6 (six) hours as needed for wheezing. 1 Inhaler 0   No current facility-administered medications on file prior to visit.     There are no Patient Instructions on file for this visit. No Follow-up on file.   Blossom Crume A Kannon Baum, PA-C

## 2016-09-24 ENCOUNTER — Encounter (INDEPENDENT_AMBULATORY_CARE_PROVIDER_SITE_OTHER): Payer: Medicare Other

## 2016-09-24 ENCOUNTER — Ambulatory Visit (INDEPENDENT_AMBULATORY_CARE_PROVIDER_SITE_OTHER): Payer: Medicare Other | Admitting: Vascular Surgery

## 2016-10-04 ENCOUNTER — Encounter: Payer: Self-pay | Admitting: Emergency Medicine

## 2016-10-04 ENCOUNTER — Observation Stay
Admission: EM | Admit: 2016-10-04 | Discharge: 2016-10-05 | Disposition: A | Discharge status: Home / Self Care | Payer: Medicare Other | Attending: Internal Medicine | Admitting: Internal Medicine

## 2016-10-04 ENCOUNTER — Emergency Department: Payer: Medicare Other

## 2016-10-04 DIAGNOSIS — Z79899 Other long term (current) drug therapy: Secondary | ICD-10-CM | POA: Diagnosis not present

## 2016-10-04 DIAGNOSIS — J440 Chronic obstructive pulmonary disease with acute lower respiratory infection: Secondary | ICD-10-CM | POA: Diagnosis not present

## 2016-10-04 DIAGNOSIS — J441 Chronic obstructive pulmonary disease with (acute) exacerbation: Principal | ICD-10-CM

## 2016-10-04 DIAGNOSIS — I10 Essential (primary) hypertension: Secondary | ICD-10-CM | POA: Diagnosis not present

## 2016-10-04 DIAGNOSIS — J209 Acute bronchitis, unspecified: Secondary | ICD-10-CM | POA: Insufficient documentation

## 2016-10-04 DIAGNOSIS — J9621 Acute and chronic respiratory failure with hypoxia: Secondary | ICD-10-CM | POA: Diagnosis not present

## 2016-10-04 DIAGNOSIS — F17210 Nicotine dependence, cigarettes, uncomplicated: Secondary | ICD-10-CM | POA: Insufficient documentation

## 2016-10-04 DIAGNOSIS — R0602 Shortness of breath: Secondary | ICD-10-CM

## 2016-10-04 DIAGNOSIS — K219 Gastro-esophageal reflux disease without esophagitis: Secondary | ICD-10-CM | POA: Insufficient documentation

## 2016-10-04 DIAGNOSIS — Z7951 Long term (current) use of inhaled steroids: Secondary | ICD-10-CM | POA: Diagnosis not present

## 2016-10-04 LAB — CBC
HEMATOCRIT: 41.7 % (ref 40.0–52.0)
Hemoglobin: 14.1 g/dL (ref 13.0–18.0)
MCH: 27.3 pg (ref 26.0–34.0)
MCHC: 33.9 g/dL (ref 32.0–36.0)
MCV: 80.7 fL (ref 80.0–100.0)
Platelets: 214 10*3/uL (ref 150–440)
RBC: 5.16 MIL/uL (ref 4.40–5.90)
RDW: 15 % — AB (ref 11.5–14.5)
WBC: 8.1 10*3/uL (ref 3.8–10.6)

## 2016-10-04 LAB — BASIC METABOLIC PANEL
Anion gap: 9 (ref 5–15)
BUN: 14 mg/dL (ref 6–20)
CO2: 26 mmol/L (ref 22–32)
Calcium: 8.9 mg/dL (ref 8.9–10.3)
Chloride: 102 mmol/L (ref 101–111)
Creatinine, Ser: 0.99 mg/dL (ref 0.61–1.24)
GFR calc Af Amer: >60 mL/min (ref >60)
GFR calc non Af Amer: >60 mL/min (ref >60)
Glucose, Bld: 129 mg/dL — ABNORMAL HIGH (ref 65–99)
POTASSIUM: 3.6 mmol/L (ref 3.5–5.1)
Sodium: 137 mmol/L (ref 135–145)

## 2016-10-04 LAB — TSH: TSH: 2.845 u[IU]/mL (ref 0.350–4.500)

## 2016-10-04 LAB — TROPONIN I: Troponin I: <0.03 ng/mL (ref <0.03)

## 2016-10-04 MED ORDER — IPRATROPIUM-ALBUTEROL 0.5-2.5 (3) MG/3ML IN SOLN
3.0000 mL | Freq: Once | RESPIRATORY_TRACT | Status: AC
Start: 2016-10-04 — End: 2016-10-04
  Administered 2016-10-04: 3 mL via RESPIRATORY_TRACT
  Filled 2016-10-04: qty 3

## 2016-10-04 MED ORDER — PREDNISONE 10 MG PO TABS: 5.0000 mg | ORAL_TABLET | Freq: Every day | ORAL | Status: DC

## 2016-10-04 MED ORDER — TIOTROPIUM BROMIDE MONOHYDRATE 18 MCG IN CAPS
18.0000 ug | ORAL_CAPSULE | Freq: Every day | RESPIRATORY_TRACT | Status: DC
  Administered 2016-10-04 – 2016-10-05 (×2): 18 ug via RESPIRATORY_TRACT
  Filled 2016-10-04: qty 5

## 2016-10-04 MED ORDER — VITAMIN D 1000 UNITS PO TABS
1000.0000 [IU] | ORAL_TABLET | Freq: Every day | ORAL | Status: DC
  Administered 2016-10-04 – 2016-10-05 (×2): 1000 [IU] via ORAL
  Filled 2016-10-04 (×2): qty 1

## 2016-10-04 MED ORDER — ENOXAPARIN SODIUM 40 MG/0.4ML ~~LOC~~ SOLN
40.0000 mg | SUBCUTANEOUS | Status: DC
  Administered 2016-10-04: 40 mg via SUBCUTANEOUS
  Filled 2016-10-04: qty 0.4

## 2016-10-04 MED ORDER — DOCUSATE SODIUM 100 MG PO CAPS
100.0000 mg | ORAL_CAPSULE | Freq: Two times a day (BID) | ORAL | Status: DC
  Administered 2016-10-04 – 2016-10-05 (×3): 100 mg via ORAL
  Filled 2016-10-04 (×3): qty 1

## 2016-10-04 MED ORDER — MOMETASONE FURO-FORMOTEROL FUM 200-5 MCG/ACT IN AERO
2.0000 | INHALATION_SPRAY | Freq: Two times a day (BID) | RESPIRATORY_TRACT | Status: DC
  Administered 2016-10-04 – 2016-10-05 (×3): 2 via RESPIRATORY_TRACT
  Filled 2016-10-04: qty 8.8

## 2016-10-04 MED ORDER — ACETAMINOPHEN 650 MG RE SUPP: 650.0000 mg | Freq: Four times a day (QID) | RECTAL | Status: DC | PRN

## 2016-10-04 MED ORDER — METHOCARBAMOL 500 MG PO TABS: 500.0000 mg | ORAL_TABLET | Freq: Four times a day (QID) | ORAL | Status: DC | PRN

## 2016-10-04 MED ORDER — ALBUTEROL SULFATE (2.5 MG/3ML) 0.083% IN NEBU: 2.5000 mg | INHALATION_SOLUTION | RESPIRATORY_TRACT | Status: DC | PRN

## 2016-10-04 MED ORDER — PREDNISONE 10 MG PO TABS: 10.0000 mg | ORAL_TABLET | Freq: Every day | ORAL | Status: DC

## 2016-10-04 MED ORDER — ONDANSETRON HCL 4 MG/2ML IJ SOLN: 4.0000 mg | Freq: Four times a day (QID) | INTRAMUSCULAR | Status: DC | PRN

## 2016-10-04 MED ORDER — PREDNISONE 50 MG PO TABS
50.0000 mg | ORAL_TABLET | Freq: Every day | ORAL | Status: AC
  Administered 2016-10-04: 50 mg via ORAL
  Filled 2016-10-04: qty 1

## 2016-10-04 MED ORDER — PREDNISONE 10 MG PO TABS
40.0000 mg | ORAL_TABLET | Freq: Every day | ORAL | Status: AC
  Administered 2016-10-05: 40 mg via ORAL
  Filled 2016-10-04: qty 4

## 2016-10-04 MED ORDER — PANTOPRAZOLE SODIUM 40 MG PO TBEC
40.0000 mg | DELAYED_RELEASE_TABLET | Freq: Every day | ORAL | Status: DC
  Administered 2016-10-04 – 2016-10-05 (×2): 40 mg via ORAL
  Filled 2016-10-04 (×2): qty 1

## 2016-10-04 MED ORDER — LORATADINE 10 MG PO TABS
10.0000 mg | ORAL_TABLET | Freq: Every day | ORAL | Status: DC
  Administered 2016-10-04 – 2016-10-05 (×2): 10 mg via ORAL
  Filled 2016-10-04 (×2): qty 1

## 2016-10-04 MED ORDER — GABAPENTIN 300 MG PO CAPS
900.0000 mg | ORAL_CAPSULE | Freq: Three times a day (TID) | ORAL | Status: DC
  Administered 2016-10-04 – 2016-10-05 (×4): 900 mg via ORAL
  Filled 2016-10-04 (×4): qty 3

## 2016-10-04 MED ORDER — AZITHROMYCIN 500 MG PO TABS
500.0000 mg | ORAL_TABLET | Freq: Every day | ORAL | Status: AC
  Administered 2016-10-04: 500 mg via ORAL
  Filled 2016-10-04: qty 1

## 2016-10-04 MED ORDER — METHYLPREDNISOLONE SODIUM SUCC 125 MG IJ SOLR
INTRAMUSCULAR | Status: AC
  Filled 2016-10-04: qty 2

## 2016-10-04 MED ORDER — METHYLPREDNISOLONE SODIUM SUCC 125 MG IJ SOLR
125.0000 mg | Freq: Once | INTRAMUSCULAR | Status: AC
  Administered 2016-10-04: 125 mg via INTRAVENOUS

## 2016-10-04 MED ORDER — ACETAMINOPHEN 325 MG PO TABS: 650.0000 mg | ORAL_TABLET | Freq: Four times a day (QID) | ORAL | Status: DC | PRN

## 2016-10-04 MED ORDER — PREDNISONE 10 MG PO TABS: 30.0000 mg | ORAL_TABLET | Freq: Every day | ORAL | Status: DC

## 2016-10-04 MED ORDER — ONDANSETRON HCL 4 MG PO TABS: 4.0000 mg | ORAL_TABLET | Freq: Four times a day (QID) | ORAL | Status: DC | PRN

## 2016-10-04 MED ORDER — AZITHROMYCIN 250 MG PO TABS
250.0000 mg | ORAL_TABLET | Freq: Every day | ORAL | Status: DC
  Administered 2016-10-05: 250 mg via ORAL
  Filled 2016-10-04: qty 1

## 2016-10-04 MED ORDER — PREDNISONE 10 MG PO TABS: 20.0000 mg | ORAL_TABLET | Freq: Every day | ORAL | Status: DC

## 2016-10-04 NOTE — Progress Notes (Signed)
Initial Nutrition Assessment  DOCUMENTATION CODES:   Severe malnutrition in context of chronic illness  INTERVENTION:  1. Boost Breeze po TID, each supplement provides 250 kcal and 9 grams of protein  NUTRITION DIAGNOSIS:   Malnutrition (Severe) related to chronic illness (COPD) as evidenced by severe depletion of muscle mass, severe depletion of body fat.  GOAL:   Patient will meet greater than or equal to 90% of their needs  MONITOR:   PO intake, I & O's, Labs, Weight trends, Supplement acceptance  REASON FOR ASSESSMENT:   Other (Comment) (Low BMI)    ASSESSMENT:   The patient with past medical history of COPD and hypertension presents to the emergency department complaining of shortness of breath. He states that he has been intermittently short of breath for a week  Spoke with patient at bedside. He mumbles but is understandable. Relates that his PO intake and weight were unchanged at home.  Eats 2 meals per day "regular sized portions" per patient. Denies any weight loss.  Slept through lunch today. PO 100% for breakfast.  Nutrition-Focused physical exam completed. Findings are severe fat depletion, severe muscle depletion, and no edema.   Labs and medications reviewed: Vitamin D, Prednisone, Colace  Diet Order:  Diet Heart Room service appropriate? Yes; Fluid consistency: Thin  Skin:  Reviewed, no issues  Last BM:  10/03/2016  Height:   Ht Readings from Last 1 Encounters:  10/04/16 6' (1.829 m)    Weight:   Wt Readings from Last 1 Encounters:  10/04/16 123 lb (55.8 kg)    Ideal Body Weight:  80.9 kg  BMI:  Body mass index is 16.68 kg/m.  Estimated Nutritional Needs:   Kcal:  1700-2000 calories  Protein:  67-84 gm  Fluid:  >/= 1.7L  EDUCATION NEEDS:   Education needs no appropriate at this time  Dionne AnoWilliam M. Jarica Plass, MS, RD LDN Inpatient Clinical Dietitian Pager [phone]

## 2016-10-04 NOTE — Progress Notes (Signed)
Pt received from ED. Pt alert and oriented x4. Remaining on 4L of acute oxygen.

## 2016-10-04 NOTE — H&P (Signed)
Kevin Holden is an 66 y.o. male.   Chief Complaint: Shortness of breath HPI: The patient with past medical history of COPD and hypertension presents to the emergency department complaining of shortness of breath. He states that he has been intermittently short of breath for a week. Usually he rests and is breathing returns to normal but since yesterday he has been unable to catch his breath. He also admits to cough that is occasionally productive of thick white sputum. Tonight his shortness of breath was making him weak which prompted him to call EMS. He was given steroids en route to the emergency department and placed on supplemental oxygen therapy upon arrival. Initial oxygen saturations 85% on 2-3 L of oxygen via nasal cannula. He received reading treatments which made him more comfortable. The patient denies chest pain throughout this episode. His respiratory rate improved but the patient still required 3-4 L of oxygen via nasal cannula prompted the emergency department staff to call the hospitalist service for admission.  Past Medical History:  Diagnosis Date  . Asthma   . COPD (chronic obstructive pulmonary disease) ([place])   . HTN (hypertension)   . Pneumonia     Past Surgical History:  Procedure Laterality Date  . ABDOMINAL SURGERY    . PERIPHERAL VASCULAR CATHETERIZATION Right 05/27/2016   Procedure: Lower Extremity Angiography;  Surgeon: Algernon Huxley, MD;  Location: [facility];  Service: Cardiovascular;  Laterality: Right;  . PERIPHERAL VASCULAR CATHETERIZATION N/A 05/27/2016   Procedure: Abdominal Aortogram w/Lower Extremity;  Surgeon: Algernon Huxley, MD;  Location: [facility];  Service: Cardiovascular;  Laterality: N/A;  . PERIPHERAL VASCULAR CATHETERIZATION  05/27/2016   Procedure: Lower Extremity Intervention;  Surgeon: Algernon Huxley, MD;  Location: [facility];  Service: Cardiovascular;;  . VASCULAR SURGERY     2016 balloon in vein in right leg     History reviewed. No pertinent family history. Patient does not know Social History:  reports that he has been smoking Cigarettes.  He has a 40.00 pack-year smoking history. He has never used smokeless tobacco. He reports that he does not drink alcohol or use drugs.  Allergies:  Allergies  Allergen Reactions  . Aspirin Other (See Comments)    Unknown  . Celecoxib     Other reaction(s): Unknown  . Nsaids     Other reaction(s): Unknown    Medications Prior to Admission  Medication Sig Dispense Refill  . albuterol (PROVENTIL HFA;VENTOLIN HFA) 108 (90 Base) MCG/ACT inhaler Inhale 1-2 puffs into the lungs every 6 (six) hours as needed for wheezing or shortness of breath.    . budesonide-formoterol (SYMBICORT) 160-4.5 MCG/ACT inhaler Inhale 2 puffs into the lungs daily.     . cholecalciferol (VITAMIN D) 1000 units tablet Take 1,000 Units by mouth daily.    . Ferrous Gluconate (IRON 27 PO) Take 1 tablet by mouth daily.    Marland Kitchen gabapentin (NEURONTIN) 300 MG capsule Take 900 mg by mouth 3 (three) times daily.     Marland Kitchen loratadine (CLARITIN) 10 MG tablet Take 10 mg by mouth daily.     . methocarbamol (ROBAXIN) 500 MG tablet Take 500 mg by mouth every 6 (six) hours as needed for muscle spasms.    Marland Kitchen omeprazole (PRILOSEC) 20 MG capsule Take 20 mg by mouth daily.    Marland Kitchen albuterol (PROVENTIL HFA;VENTOLIN HFA) 108 (90 BASE) MCG/ACT inhaler Inhale 2 puffs into the lungs every 6 (six) hours as needed for wheezing. 1 Inhaler 0  Results for orders placed or performed during the hospital encounter of 10/04/16 (from the past 48 hour(s))  Basic metabolic panel     Status: Abnormal   Collection Time: 10/04/16  1:59 AM  Result Value Ref Range   Sodium 137 135 - 145 mmol/L   Potassium 3.6 3.5 - 5.1 mmol/L   Chloride 102 101 - 111 mmol/L   CO2 26 22 - 32 mmol/L   Glucose, Bld 129 (H) 65 - 99 mg/dL   BUN 14 6 - 20 mg/dL   Creatinine, Ser 0.99 0.61 - 1.24 mg/dL   Calcium 8.9 8.9 - 10.3 mg/dL   GFR calc non  Af Amer >60 >60 mL/min   GFR calc Af Amer >60 >60 mL/min    Comment: (NOTE) The eGFR has been calculated using the CKD EPI equation. This calculation has not been validated in all clinical situations. eGFR's persistently <60 mL/min signify possible Chronic Kidney Disease.    Anion gap 9 5 - 15  CBC     Status: Abnormal   Collection Time: 10/04/16  1:59 AM  Result Value Ref Range   WBC 8.1 3.8 - 10.6 K/uL   RBC 5.16 4.40 - 5.90 MIL/uL   Hemoglobin 14.1 13.0 - 18.0 g/dL   HCT 41.7 40.0 - 52.0 %   MCV 80.7 80.0 - 100.0 fL   MCH 27.3 26.0 - 34.0 pg   MCHC 33.9 32.0 - 36.0 g/dL   RDW 15.0 (H) 11.5 - 14.5 %   Platelets 214 150 - 440 K/uL  Troponin I     Status: None   Collection Time: 10/04/16  1:59 AM  Result Value Ref Range   Troponin I <0.03 <0.03 ng/mL  TSH     Status: None   Collection Time: 10/04/16  1:59 AM  Result Value Ref Range   TSH 2.845 0.350 - 4.500 uIU/mL    Comment: Performed by a 3rd Generation assay with a functional sensitivity of <=0.01 uIU/mL.   Dg Chest 1 View  Result Date: 10/04/2016 CLINICAL DATA:  Shortness of breath beginning at 2330 hours. Dry cough. Chest pressure and upper abdominal pressure. EXAM: CHEST 1 VIEW COMPARISON:  09/16/2015 FINDINGS: Normal heart size and pulmonary vascularity. Emphysematous changes and scattered fibrosis in the lungs. No focal airspace disease or consolidation. No blunting of costophrenic angles. No pneumothorax. No change since prior study. IMPRESSION: Emphysematous changes and scattered fibrosis in the lungs. No evidence of active pulmonary disease. Electronically Signed   By: Lucienne Capers M.D.   On: 10/04/2016 02:27    Review of Systems  Constitutional: Negative for chills and fever.  HENT: Negative for sore throat and tinnitus.   Eyes: Negative for blurred vision and redness.  Respiratory: Positive for cough, sputum production and shortness of breath.   Cardiovascular: Negative for chest pain, palpitations, orthopnea  and PND.  Gastrointestinal: Negative for abdominal pain, diarrhea, nausea and vomiting.  Genitourinary: Negative for dysuria, frequency and urgency.  Musculoskeletal: Negative for joint pain and myalgias.  Skin: Negative for rash.       No lesions  Neurological: Negative for speech change, focal weakness and weakness.  Endo/Heme/Allergies: Does not bruise/bleed easily.       No temperature intolerance  Psychiatric/Behavioral: Negative for depression and suicidal ideas.    Blood pressure 131/75, pulse 82, temperature 97.9 F (36.6 C), temperature source Oral, resp. rate 19, height 6' (1.829 m), weight 55.8 kg (123 lb), SpO2 97 %. Physical Exam  Nursing note and vitals reviewed.  Constitutional: He is oriented to person, place, and time. He appears well-developed and well-nourished. No distress.  HENT:  Head: Normocephalic and atraumatic.  Mouth/Throat: Oropharynx is clear and moist.  Eyes: Conjunctivae and EOM are normal. Pupils are equal, round, and reactive to light. No scleral icterus.  Neck: Normal range of motion. Neck supple. No JVD present. No tracheal deviation present. No thyromegaly present.  Cardiovascular: Normal rate, regular rhythm and normal heart sounds.  Exam reveals no gallop and no friction rub.   No murmur heard. Respiratory: Effort normal and breath sounds normal. No respiratory distress.  GI: Soft. Bowel sounds are normal. He exhibits no distension. There is no tenderness.  Genitourinary:  Genitourinary Comments: Deferred  Musculoskeletal: Normal range of motion. He exhibits no edema.  Lymphadenopathy:    He has no cervical adenopathy.  Neurological: He is alert and oriented to person, place, and time. He has normal reflexes. No cranial nerve deficit.  Skin: Skin is warm and dry. No rash noted. No erythema.  Psychiatric: He has a normal mood and affect. His behavior is normal. Judgment and thought content normal.     Assessment/Plan This is a 66 year old male  admitted for COPD exacerbation. 1. Acute on chronic respiratory failure: With hypoxia. Continue supplemental oxygen therapy. I have placed the patient on a steroid taper and prescribed azithromycin for anti-inflammatory effect. 2. COPD: I have added Spiriva to his inhaled corticosteroid therapy. Albuterol as needed 3. Essential hypertension: Relatively controlled via diet. 4. DVT prophylaxis: Lovenox 5. GI prophylaxis: None The patient is a full code. Time spent on admission orders and patient care approximately 45 minutes   Harrie Foreman, MD 10/04/2016, 7:22 AM

## 2016-10-04 NOTE — ED Notes (Signed)
Report to matt, rn for lunch relief.  

## 2016-10-04 NOTE — Progress Notes (Signed)
Pt home medication sent down to the pharmacy for storage.

## 2016-10-04 NOTE — ED Provider Notes (Signed)
[facility] Emergency Department Provider Note   ____________________________________________   First MD Initiated Contact with Patient 10/04/16 0205     (approximate)  I have reviewed the triage vital signs and the nursing notes.   HISTORY  Chief Complaint Shortness of Breath    HPI Kevin Holden is a 66 y.o. male brought to the ED from home via EMS with a chief complaint of respiratory distress. Patient has a history of COPD, not on home oxygen, who complains of dry cough over the weekend with progressive shortness of breath which worsened approximately 11:30 PM. Symptoms associated with chest tightness. Patient given 2 DuoNeb per EMS prior to arrival. Denies associated fever, chills, abdominal pain, nausea, vomiting, diarrhea. Denies recent travel or trauma. Nebulizer treatments are starting to improve patient's symptoms.   Past Medical History:  Diagnosis Date  . Asthma   . COPD (chronic obstructive pulmonary disease) ([facility])   . HTN (hypertension)   . Pneumonia     Patient Active Problem List   Diagnosis Date Noted  . PAD (peripheral artery disease) ([facility]) 06/24/2016  . Ulcer of right foot with necrosis of muscle ([facility]) 05/11/2016  . Atherosclerosis of native arteries of the extremities with ulceration ([facility]) 05/11/2016  . CAP (community acquired pneumonia) 11/10/2011  . Hypoxia 11/10/2011  . Lung nodule 11/10/2011  . Current every day smoker 11/10/2011  . COPD (chronic obstructive pulmonary disease) ([facility]) 11/10/2011  . Benign hypertension 11/10/2011    Past Surgical History:  Procedure Laterality Date  . ABDOMINAL SURGERY    . PERIPHERAL VASCULAR CATHETERIZATION Right 05/27/2016   Procedure: Lower Extremity Angiography;  Surgeon: Annice NeedyJason S Dew, MD;  Location: [facility];  Service: Cardiovascular;  Laterality: Right;  . PERIPHERAL VASCULAR CATHETERIZATION N/A 05/27/2016   Procedure: Abdominal Aortogram w/Lower Extremity;  Surgeon:  Annice NeedyJason S Dew, MD;  Location: [facility];  Service: Cardiovascular;  Laterality: N/A;  . PERIPHERAL VASCULAR CATHETERIZATION  05/27/2016   Procedure: Lower Extremity Intervention;  Surgeon: Annice NeedyJason S Dew, MD;  Location: [facility];  Service: Cardiovascular;;  . VASCULAR SURGERY     2016 balloon in vein in right leg    Prior to Admission medications   Medication Sig Start Date End Date Taking? Authorizing Provider  albuterol (PROVENTIL HFA;VENTOLIN HFA) 108 (90 Base) MCG/ACT inhaler Inhale 1-2 puffs into the lungs every 6 (six) hours as needed for wheezing or shortness of breath.   Yes [provider]  budesonide-formoterol (SYMBICORT) 160-4.5 MCG/ACT inhaler Inhale 2 puffs into the lungs daily.    Yes [provider]  gabapentin (NEURONTIN) 300 MG capsule Take 300 mg by mouth 3 (three) times daily.   Yes [provider]  loratadine (CLARITIN) 10 MG tablet Take 10 mg by mouth daily.    Yes [provider]  omeprazole (PRILOSEC) 20 MG capsule Take 20 mg by mouth daily.   Yes [provider]  albuterol (PROVENTIL HFA;VENTOLIN HFA) 108 (90 BASE) MCG/ACT inhaler Inhale 2 puffs into the lungs every 6 (six) hours as needed for wheezing. 11/11/11 05/26/16  Christiane HaSullivan, Corinna L, MD    Allergies Aspirin; Celecoxib; and Nsaids  History reviewed. No pertinent family history.  Social History Social History  Substance Use Topics  . Smoking status: Current Every Day Smoker    Packs/day: 1.00    Years: 40.00    Types: Cigarettes  . Smokeless tobacco: Never Used  . Alcohol use No    Review of Systems  Constitutional:  No fever/chills. Eyes: No visual changes. ENT: No sore throat. Cardiovascular: Positive for chest tightness. Respiratory: Positive for cough and shortness of breath. Gastrointestinal: No abdominal pain.  No nausea, no vomiting.  No diarrhea.  No constipation. Genitourinary: Negative for dysuria. Musculoskeletal: Negative  for back pain. Skin: Negative for rash. Neurological: Negative for headaches, focal weakness or numbness.   ____________________________________________   PHYSICAL EXAM:  VITAL SIGNS: ED Triage Vitals  Enc Vitals Group     BP 10/04/16 0202 (!) 147/100     Pulse Rate 10/04/16 0156 (!) 109     Resp 10/04/16 0156 (!) 34     Temp 10/04/16 0202 (!) 96.8 F (36 C)     Temp Source 10/04/16 0156 Oral     SpO2 --      Weight 10/04/16 0157 130 lb (59 kg)     Height 10/04/16 0157 6' (1.829 m)     Head Circumference --      Peak Flow --      Pain Score 10/04/16 0156 8     Pain Loc --      Pain Edu? --      Excl. in GC? --     Constitutional: Alert and oriented. Cachectic appearing and in moderate acute distress. Eyes: Conjunctivae are normal. PERRL. EOMI. Head: Atraumatic. Nose: No congestion/rhinnorhea. Mouth/Throat: Mucous membranes are moist.  Oropharynx non-erythematous. Neck: No stridor.   Cardiovascular: Tachycardic rate, regular rhythm. Grossly normal heart sounds.  Good peripheral circulation. Respiratory: Increased respiratory effort.  Mild retractions. Lungs with diffuse wheezing. Gastrointestinal: Soft and nontender. No distention. No abdominal bruits. No CVA tenderness. Musculoskeletal: No lower extremity tenderness nor edema.  No joint effusions. Neurologic:  Normal speech and language. No gross focal neurologic deficits are appreciated.  Skin:  Skin is warm, dry and intact. No rash noted. Psychiatric: Mood and affect are normal. Speech and behavior are normal.  ____________________________________________   LABS (all labs ordered are listed, but only abnormal results are displayed)  Labs Reviewed  BASIC METABOLIC PANEL - Abnormal; Notable for the following:       Result Value   Glucose, Bld 129 (*)    All other components within normal limits  CBC - Abnormal; Notable for the following:    RDW 15.0 (*)    All other components within normal limits  TROPONIN I     ____________________________________________  EKG  ED ECG REPORT I, Manal Kreutzer J, the attending physician, personally viewed and interpreted this ECG.   Date: 10/04/2016  EKG Time: 0202  Rate: 112  Rhythm: sinus tachycardia  Axis: Normal  Intervals:none  ST&T Change: Nonspecific  ____________________________________________  RADIOLOGY  Chest 1 view interpreted per Dr. Andria Meuse: Emphysematous changes and scattered fibrosis in the lungs. No  evidence of active pulmonary disease.   ____________________________________________   PROCEDURES  Procedure(s) performed: None  Procedures  Critical Care performed: Yes, see critical care note(s)   CRITICAL CARE Performed by: Irean Hong   Total critical care time: 30 minutes  Critical care time was exclusive of separately billable procedures and treating other patients.  Critical care was necessary to treat or prevent imminent or life-threatening deterioration.  Critical care was time spent personally by me on the following activities: development of treatment plan with patient and/or surrogate as well as nursing, discussions with consultants, evaluation of patient's response to treatment, examination of patient, obtaining history from patient or surrogate, ordering and performing treatments and interventions, ordering and review of laboratory studies, ordering and review of  radiographic studies, pulse oximetry and re-evaluation of patient's condition.  ____________________________________________   INITIAL IMPRESSION / ASSESSMENT AND PLAN / ED COURSE  Pertinent labs & imaging results that were available during my care of the patient were reviewed by me and considered in my medical decision making (see chart for details).  66 year old male with a history of COPD who presents with exacerbation. Received 2 DuoNeb treatments prior to arrival. Will add IV Solu-Medrol, administer third nebulizer treatment and reassess.  Anticipate hospitalization.  Clinical Course as of Oct 05 239  Mon Oct 04, 2016  0240 Reexamined after third DuoNeb. Patient remains tachypneic with wheezes. Will discuss with hospitalist to evaluate patient in the emergency department for admission.  [JS]    Clinical Course User Index [JS] Irean Hong, MD     ____________________________________________   FINAL CLINICAL IMPRESSION(S) / ED DIAGNOSES  Final diagnoses:  SOB (shortness of breath)  COPD exacerbation ([facility])      NEW MEDICATIONS STARTED DURING THIS VISIT:  New Prescriptions   No medications on file     Note:  This document was prepared using Dragon voice recognition software and may include unintentional dictation errors.    Irean Hong, MD 10/04/16 [phone]

## 2016-10-04 NOTE — Care Management Obs Status (Signed)
MEDICARE OBSERVATION STATUS NOTIFICATION   Patient Details  Name: Kevin Holden MRN: [id number] Date of Birth: May 26, 1951   Medicare Observation Status Notification Given:  Yes    Marily MemosLisa M Kashena Novitski, RN 10/04/2016, 2:18 PM

## 2016-10-04 NOTE — Progress Notes (Signed)
Patient seen and evaluated this morning. Agree with Dr. Elias Elsediamond's plan. Continue oral prednisone. On examination patient has no wheezing. Wean oxygen as tolerated. Case discussed with nursing.

## 2016-10-04 NOTE — ED Triage Notes (Signed)
Pt from home with duoneb in progress. Pt with shortness of breath that began at 2330. Pt with dry cough noted. Pt complains of chest pressure and upper abd pressure. Pt is able to speak in short chopped sentences. Wheezing auscultated in all lung fields.

## 2016-10-04 NOTE — ED Notes (Signed)
Pt updated on admission process. Pt verbalizes understanding.  

## 2016-10-05 DIAGNOSIS — J441 Chronic obstructive pulmonary disease with (acute) exacerbation: Secondary | ICD-10-CM | POA: Diagnosis not present

## 2016-10-05 LAB — HEMOGLOBIN A1C
Hgb A1c MFr Bld: 5.6 % (ref 4.8–5.6)
Mean Plasma Glucose: 114 mg/dL

## 2016-10-05 MED ORDER — AZITHROMYCIN 250 MG PO TABS: ORAL_TABLET | ORAL | 0 refills | Status: DC

## 2016-10-05 MED ORDER — BUDESONIDE-FORMOTEROL FUMARATE 160-4.5 MCG/ACT IN AERO: 2.0000 | INHALATION_SPRAY | Freq: Every day | RESPIRATORY_TRACT | 0 refills | Status: DC

## 2016-10-05 MED ORDER — ALBUTEROL SULFATE HFA 108 (90 BASE) MCG/ACT IN AERS: 1.0000 | INHALATION_SPRAY | Freq: Four times a day (QID) | RESPIRATORY_TRACT | 0 refills | Status: AC | PRN

## 2016-10-05 MED ORDER — PREDNISONE 10 MG PO TABS: 40.0000 mg | ORAL_TABLET | Freq: Every day | ORAL | 0 refills | Status: AC

## 2016-10-05 NOTE — Progress Notes (Signed)
Patient is alert and oriented and able to verbalize needs. No complaints of pain at this time. Vital signs stable. PIV removed. Telemetry discontinued. Discharge instructions gone over with patient at this time. Printed AVS and prescriptions given to patient. Medications stored in pharmacy retrieved and given back to patient. Patient verbalizes understanding of all instructions and no concerns voiced at this time. Granddaughter providing transportation home for patient.

## 2016-10-05 NOTE — Discharge Summary (Signed)
[facility] [place] [facility]   PATIENT NAME: Kevin Holden    MR#:  [id number]  DATE OF BIRTH:  02-14-1951  DATE OF ADMISSION:  10/04/2016 ADMITTING PHYSICIAN: Arnaldo Natal, MD  DATE OF DISCHARGE: 10/05/2016  PRIMARY CARE PHYSICIAN: Leanna Sato, MD    ADMISSION DIAGNOSIS:  SOB (shortness of breath) [R06.02] COPD exacerbation (HCC) [J44.1]  DISCHARGE DIAGNOSIS:  Active Problems:   Acute on chronic respiratory failure with hypoxia (HCC)   SECONDARY DIAGNOSIS:   Past Medical History:  Diagnosis Date  . Asthma   . COPD (chronic obstructive pulmonary disease) (HCC)   . HTN (hypertension)   . Pneumonia     HOSPITAL COURSE:   Exceed 53-year-old male with history of COPD who presents with acute hypoxic respiratory failure in the setting of mild COPD exacerbation.  1. Acute hypoxic respiratory failure: Patient has been weaned off of oxygen 2. Acute COPD exacerbation: Patient symptoms have improved. Patient will be discharged with 3 days of prednisone and azithromycin for acute bronchitis  3. GERD: Continue PPI  4. Tobacco dependence: Patient is encouraged to quit smoking. Counseling was provided for 4 minutes.   DISCHARGE CONDITIONS AND DIET:   Stable for discharge on regular diet  CONSULTS OBTAINED:    DRUG ALLERGIES:   Allergies  Allergen Reactions  . Aspirin Other (See Comments)    Unknown  . Celecoxib     Other reaction(s): Unknown  . Nsaids     Other reaction(s): Unknown    DISCHARGE MEDICATIONS:   Current Discharge Medication List    START taking these medications   Details  azithromycin (ZITHROMAX) 250 MG tablet Take 1 tablet daily for 3 days Qty: 3 each, Refills: 0    predniSONE (DELTASONE) 10 MG tablet Take 4 tablets (40 mg total) by mouth daily with breakfast. Qty: 12 tablet, Refills: 0      CONTINUE these medications which have CHANGED   Details  albuterol (PROVENTIL HFA;VENTOLIN HFA) 108 (90 Base) MCG/ACT  inhaler Inhale 1-2 puffs into the lungs every 6 (six) hours as needed for wheezing or shortness of breath. Qty: 6.7 g, Refills: 0    budesonide-formoterol (SYMBICORT) 160-4.5 MCG/ACT inhaler Inhale 2 puffs into the lungs daily. Qty: 1 Inhaler, Refills: 0      CONTINUE these medications which have NOT CHANGED   Details  cholecalciferol (VITAMIN D) 1000 units tablet Take 1,000 Units by mouth daily.    Ferrous Gluconate (IRON 27 PO) Take 1 tablet by mouth daily.    gabapentin (NEURONTIN) 300 MG capsule Take 900 mg by mouth 3 (three) times daily.     loratadine (CLARITIN) 10 MG tablet Take 10 mg by mouth daily.     methocarbamol (ROBAXIN) 500 MG tablet Take 500 mg by mouth every 6 (six) hours as needed for muscle spasms.    omeprazole (PRILOSEC) 20 MG capsule Take 20 mg by mouth daily.          Today   CHIEF COMPLAINT:  Patient ready to go home. Reports no shortness breath or wheezing   VITAL SIGNS:  Blood pressure (!) 140/91, pulse 62, temperature 97.6 F (36.4 C), temperature source Oral, resp. rate 16, height 6' (1.829 m), weight 56.1 kg (123 lb 11.2 oz), SpO2 95 %.   REVIEW OF SYSTEMS:  Review of Systems  Constitutional: Negative.  Negative for chills, fever and malaise/fatigue.  HENT: Negative.  Negative for ear discharge, ear pain, hearing loss, nosebleeds and sore throat.   Eyes:  Negative.  Negative for blurred vision and pain.  Respiratory: Negative.  Negative for cough, hemoptysis, shortness of breath and wheezing.   Cardiovascular: Negative.  Negative for chest pain, palpitations and leg swelling.  Gastrointestinal: Negative.  Negative for abdominal pain, blood in stool, diarrhea, nausea and vomiting.  Genitourinary: Negative.  Negative for dysuria.  Musculoskeletal: Negative.  Negative for back pain.  Skin: Negative.   Neurological: Negative for dizziness, tremors, speech change, focal weakness, seizures and headaches.  Endo/Heme/Allergies: Negative.  Does not  bruise/bleed easily.  Psychiatric/Behavioral: Negative.  Negative for depression, hallucinations and suicidal ideas.     PHYSICAL EXAMINATION:  GENERAL:  66 y.o.-year-old patient lying in the bed with no acute distress.  NECK:  Supple, no jugular venous distention. No thyroid enlargement, no tenderness.  LUNGS: Normal breath sounds bilaterally, no wheezing, rales,rhonchi  No use of accessory muscles of respiration.  CARDIOVASCULAR: S1, S2 normal. No murmurs, rubs, or gallops.  ABDOMEN: Soft, non-tender, non-distended. Bowel sounds present. No organomegaly or mass.  EXTREMITIES: No pedal edema, cyanosis, or clubbing.  PSYCHIATRIC: The patient is alert and oriented x 3.  SKIN: No obvious rash, lesion, or ulcer.   DATA REVIEW:   CBC  Recent Labs Lab 10/04/16 0159  WBC 8.1  HGB 14.1  HCT 41.7  PLT 214    Chemistries   Recent Labs Lab 10/04/16 0159  NA 137  K 3.6  CL 102  CO2 26  GLUCOSE 129*  BUN 14  CREATININE 0.99  CALCIUM 8.9    Cardiac Enzymes  Recent Labs Lab 10/04/16 0159  TROPONINI <0.03    Microbiology Results  @MICRORSLT48 @  RADIOLOGY:  Dg Chest 1 View  Result Date: 10/04/2016 CLINICAL DATA:  Shortness of breath beginning at 2330 hours. Dry cough. Chest pressure and upper abdominal pressure. EXAM: CHEST 1 VIEW COMPARISON:  09/16/2015 FINDINGS: Normal heart size and pulmonary vascularity. Emphysematous changes and scattered fibrosis in the lungs. No focal airspace disease or consolidation. No blunting of costophrenic angles. No pneumothorax. No change since prior study. IMPRESSION: Emphysematous changes and scattered fibrosis in the lungs. No evidence of active pulmonary disease. Electronically Signed   By: Burman Nieves M.D.   On: 10/04/2016 02:27      Current Discharge Medication List    START taking these medications   Details  azithromycin (ZITHROMAX) 250 MG tablet Take 1 tablet daily for 3 days Qty: 3 each, Refills: 0    predniSONE  (DELTASONE) 10 MG tablet Take 4 tablets (40 mg total) by mouth daily with breakfast. Qty: 12 tablet, Refills: 0      CONTINUE these medications which have CHANGED   Details  albuterol (PROVENTIL HFA;VENTOLIN HFA) 108 (90 Base) MCG/ACT inhaler Inhale 1-2 puffs into the lungs every 6 (six) hours as needed for wheezing or shortness of breath. Qty: 6.7 g, Refills: 0    budesonide-formoterol (SYMBICORT) 160-4.5 MCG/ACT inhaler Inhale 2 puffs into the lungs daily. Qty: 1 Inhaler, Refills: 0      CONTINUE these medications which have NOT CHANGED   Details  cholecalciferol (VITAMIN D) 1000 units tablet Take 1,000 Units by mouth daily.    Ferrous Gluconate (IRON 27 PO) Take 1 tablet by mouth daily.    gabapentin (NEURONTIN) 300 MG capsule Take 900 mg by mouth 3 (three) times daily.     loratadine (CLARITIN) 10 MG tablet Take 10 mg by mouth daily.     methocarbamol (ROBAXIN) 500 MG tablet Take 500 mg by mouth every 6 (six) hours as  needed for muscle spasms.    omeprazole (PRILOSEC) 20 MG capsule Take 20 mg by mouth daily.            Management plans discussed with the patient and he is in agreement. Stable for discharge home  Patient should follow up with pcp  CODE STATUS:     Code Status Orders        Start     Ordered   10/04/16 0444  Full code  Continuous     10/04/16 0443    Code Status History    Date Active Date Inactive Code Status Order ID Comments User Context   05/27/2016  9:28 AM 05/27/2016  2:35 PM Full Code [id number]  Annice Needyew, Jason S, MD Inpatient   11/10/2011  9:36 AM 11/11/2011  4:58 PM Full Code [id number]  Christiane HaSullivan, Corinna L, MD Inpatient      TOTAL TIME TAKING CARE OF THIS PATIENT: 37 minutes.    Note: This dictation was prepared with Dragon dictation along with smaller phrase technology. Any transcriptional errors that result from this process are unintentional.  An Schnabel M.D on 10/05/2016 at 11:20 AM  Between 7am to 6pm - Pager - [phone] After  6pm go to [email or web address] - Social research officer, [email or web address] [email or web address]  [facility]  Office  [phone]  CC: Primary care physician; Leanna SatoMiles, Linda M, MD

## 2016-10-15 ENCOUNTER — Inpatient Hospital Stay (HOSPITAL_COMMUNITY)
Admit: 2016-10-15 | Discharge: 2016-10-15 | Disposition: A | Discharge status: Home / Self Care | Payer: Medicare Other | Attending: Internal Medicine | Admitting: Internal Medicine

## 2016-10-15 ENCOUNTER — Inpatient Hospital Stay
Admission: EM | Admit: 2016-10-15 | Discharge: 2016-11-09 | DRG: 004 | Disposition: A | Discharge status: Other Acute Inpatient Hospital | Payer: Medicare Other | Attending: Internal Medicine | Admitting: Internal Medicine

## 2016-10-15 ENCOUNTER — Encounter: Payer: Self-pay | Admitting: Emergency Medicine

## 2016-10-15 ENCOUNTER — Emergency Department: Payer: Medicare Other

## 2016-10-15 DIAGNOSIS — I952 Hypotension due to drugs: Secondary | ICD-10-CM | POA: Diagnosis not present

## 2016-10-15 DIAGNOSIS — R633 Feeding difficulties: Secondary | ICD-10-CM | POA: Diagnosis not present

## 2016-10-15 DIAGNOSIS — R6521 Severe sepsis with septic shock: Secondary | ICD-10-CM | POA: Diagnosis not present

## 2016-10-15 DIAGNOSIS — R5381 Other malaise: Secondary | ICD-10-CM | POA: Diagnosis not present

## 2016-10-15 DIAGNOSIS — J9621 Acute and chronic respiratory failure with hypoxia: Secondary | ICD-10-CM

## 2016-10-15 DIAGNOSIS — L8990 Pressure ulcer of unspecified site, unspecified stage: Secondary | ICD-10-CM | POA: Insufficient documentation

## 2016-10-15 DIAGNOSIS — Z66 Do not resuscitate: Secondary | ICD-10-CM | POA: Diagnosis present

## 2016-10-15 DIAGNOSIS — N179 Acute kidney failure, unspecified: Secondary | ICD-10-CM

## 2016-10-15 DIAGNOSIS — J209 Acute bronchitis, unspecified: Secondary | ICD-10-CM | POA: Diagnosis present

## 2016-10-15 DIAGNOSIS — J441 Chronic obstructive pulmonary disease with (acute) exacerbation: Secondary | ICD-10-CM

## 2016-10-15 DIAGNOSIS — Z9911 Dependence on respirator [ventilator] status: Secondary | ICD-10-CM

## 2016-10-15 DIAGNOSIS — N170 Acute kidney failure with tubular necrosis: Secondary | ICD-10-CM | POA: Diagnosis present

## 2016-10-15 DIAGNOSIS — I313 Pericardial effusion (noninflammatory): Secondary | ICD-10-CM | POA: Diagnosis not present

## 2016-10-15 DIAGNOSIS — Z01818 Encounter for other preprocedural examination: Secondary | ICD-10-CM

## 2016-10-15 DIAGNOSIS — J45901 Unspecified asthma with (acute) exacerbation: Secondary | ICD-10-CM | POA: Diagnosis present

## 2016-10-15 DIAGNOSIS — J440 Chronic obstructive pulmonary disease with acute lower respiratory infection: Secondary | ICD-10-CM | POA: Diagnosis present

## 2016-10-15 DIAGNOSIS — Z681 Body mass index (BMI) 19 or less, adult: Secondary | ICD-10-CM | POA: Diagnosis not present

## 2016-10-15 DIAGNOSIS — E43 Unspecified severe protein-calorie malnutrition: Secondary | ICD-10-CM | POA: Diagnosis present

## 2016-10-15 DIAGNOSIS — R0600 Dyspnea, unspecified: Secondary | ICD-10-CM

## 2016-10-15 DIAGNOSIS — I872 Venous insufficiency (chronic) (peripheral): Secondary | ICD-10-CM | POA: Diagnosis present

## 2016-10-15 DIAGNOSIS — E1165 Type 2 diabetes mellitus with hyperglycemia: Secondary | ICD-10-CM | POA: Diagnosis present

## 2016-10-15 DIAGNOSIS — J9622 Acute and chronic respiratory failure with hypercapnia: Secondary | ICD-10-CM | POA: Diagnosis present

## 2016-10-15 DIAGNOSIS — A419 Sepsis, unspecified organism: Secondary | ICD-10-CM | POA: Diagnosis not present

## 2016-10-15 DIAGNOSIS — R451 Restlessness and agitation: Secondary | ICD-10-CM | POA: Diagnosis not present

## 2016-10-15 DIAGNOSIS — J189 Pneumonia, unspecified organism: Secondary | ICD-10-CM | POA: Diagnosis present

## 2016-10-15 DIAGNOSIS — T380X5A Adverse effect of glucocorticoids and synthetic analogues, initial encounter: Secondary | ICD-10-CM | POA: Diagnosis not present

## 2016-10-15 DIAGNOSIS — E875 Hyperkalemia: Secondary | ICD-10-CM | POA: Diagnosis present

## 2016-10-15 DIAGNOSIS — R410 Disorientation, unspecified: Secondary | ICD-10-CM | POA: Diagnosis not present

## 2016-10-15 DIAGNOSIS — J449 Chronic obstructive pulmonary disease, unspecified: Secondary | ICD-10-CM | POA: Diagnosis not present

## 2016-10-15 DIAGNOSIS — G9340 Encephalopathy, unspecified: Secondary | ICD-10-CM | POA: Diagnosis present

## 2016-10-15 DIAGNOSIS — K567 Ileus, unspecified: Secondary | ICD-10-CM

## 2016-10-15 DIAGNOSIS — N189 Chronic kidney disease, unspecified: Secondary | ICD-10-CM | POA: Diagnosis present

## 2016-10-15 DIAGNOSIS — R29898 Other symptoms and signs involving the musculoskeletal system: Secondary | ICD-10-CM | POA: Diagnosis not present

## 2016-10-15 DIAGNOSIS — E872 Acidosis: Secondary | ICD-10-CM | POA: Diagnosis present

## 2016-10-15 DIAGNOSIS — K219 Gastro-esophageal reflux disease without esophagitis: Secondary | ICD-10-CM | POA: Diagnosis present

## 2016-10-15 DIAGNOSIS — R079 Chest pain, unspecified: Secondary | ICD-10-CM

## 2016-10-15 DIAGNOSIS — Z4659 Encounter for fitting and adjustment of other gastrointestinal appliance and device: Secondary | ICD-10-CM

## 2016-10-15 DIAGNOSIS — F17210 Nicotine dependence, cigarettes, uncomplicated: Secondary | ICD-10-CM | POA: Diagnosis present

## 2016-10-15 DIAGNOSIS — R05 Cough: Secondary | ICD-10-CM

## 2016-10-15 DIAGNOSIS — E870 Hyperosmolality and hypernatremia: Secondary | ICD-10-CM | POA: Diagnosis present

## 2016-10-15 DIAGNOSIS — Z7951 Long term (current) use of inhaled steroids: Secondary | ICD-10-CM | POA: Diagnosis not present

## 2016-10-15 DIAGNOSIS — Z930 Tracheostomy status: Secondary | ICD-10-CM | POA: Diagnosis not present

## 2016-10-15 DIAGNOSIS — R0602 Shortness of breath: Secondary | ICD-10-CM | POA: Diagnosis present

## 2016-10-15 DIAGNOSIS — I129 Hypertensive chronic kidney disease with stage 1 through stage 4 chronic kidney disease, or unspecified chronic kidney disease: Secondary | ICD-10-CM | POA: Diagnosis present

## 2016-10-15 DIAGNOSIS — T4275XA Adverse effect of unspecified antiepileptic and sedative-hypnotic drugs, initial encounter: Secondary | ICD-10-CM | POA: Diagnosis not present

## 2016-10-15 DIAGNOSIS — E1122 Type 2 diabetes mellitus with diabetic chronic kidney disease: Secondary | ICD-10-CM | POA: Diagnosis present

## 2016-10-15 DIAGNOSIS — J9601 Acute respiratory failure with hypoxia: Secondary | ICD-10-CM | POA: Diagnosis not present

## 2016-10-15 DIAGNOSIS — I509 Heart failure, unspecified: Secondary | ICD-10-CM | POA: Diagnosis present

## 2016-10-15 DIAGNOSIS — B370 Candidal stomatitis: Secondary | ICD-10-CM | POA: Diagnosis not present

## 2016-10-15 DIAGNOSIS — R6339 Other feeding difficulties: Secondary | ICD-10-CM

## 2016-10-15 DIAGNOSIS — Z8701 Personal history of pneumonia (recurrent): Secondary | ICD-10-CM

## 2016-10-15 DIAGNOSIS — R059 Cough, unspecified: Secondary | ICD-10-CM

## 2016-10-15 DIAGNOSIS — J9690 Respiratory failure, unspecified, unspecified whether with hypoxia or hypercapnia: Secondary | ICD-10-CM

## 2016-10-15 DIAGNOSIS — J411 Mucopurulent chronic bronchitis: Secondary | ICD-10-CM | POA: Diagnosis not present

## 2016-10-15 DIAGNOSIS — J9600 Acute respiratory failure, unspecified whether with hypoxia or hypercapnia: Secondary | ICD-10-CM

## 2016-10-15 DIAGNOSIS — D649 Anemia, unspecified: Secondary | ICD-10-CM | POA: Diagnosis present

## 2016-10-15 LAB — COMPREHENSIVE METABOLIC PANEL
ALT: 14 U/L — ABNORMAL LOW (ref 17–63)
ANION GAP: 10 (ref 5–15)
AST: 26 U/L (ref 15–41)
Albumin: 3.7 g/dL (ref 3.5–5.0)
Alkaline Phosphatase: 75 U/L (ref 38–126)
BUN: 16 mg/dL (ref 6–20)
CHLORIDE: 102 mmol/L (ref 101–111)
CO2: 26 mmol/L (ref 22–32)
Calcium: 8.6 mg/dL — ABNORMAL LOW (ref 8.9–10.3)
Creatinine, Ser: 1.11 mg/dL (ref 0.61–1.24)
GFR calc non Af Amer: >60 mL/min (ref >60)
Glucose, Bld: 143 mg/dL — ABNORMAL HIGH (ref 65–99)
Potassium: 4.1 mmol/L (ref 3.5–5.1)
Sodium: 138 mmol/L (ref 135–145)
Total Bilirubin: 0.5 mg/dL (ref 0.3–1.2)
Total Protein: 7.9 g/dL (ref 6.5–8.1)

## 2016-10-15 LAB — CBC WITH DIFFERENTIAL/PLATELET
BASOS PCT: 1 %
Basophils Absolute: 0 10*3/uL (ref 0–0.1)
EOS ABS: 0 10*3/uL (ref 0–0.7)
EOS PCT: 1 %
HCT: 45.9 % (ref 40.0–52.0)
Hemoglobin: 15.5 g/dL (ref 13.0–18.0)
Lymphocytes Relative: 21 %
Lymphs Abs: 1.4 10*3/uL (ref 1.0–3.6)
MCH: 28 pg (ref 26.0–34.0)
MCHC: 33.7 g/dL (ref 32.0–36.0)
MCV: 83.1 fL (ref 80.0–100.0)
MONO ABS: 0.6 10*3/uL (ref 0.2–1.0)
Monocytes Relative: 10 %
NEUTROS ABS: 4.4 10*3/uL (ref 1.4–6.5)
Neutrophils Relative %: 67 %
PLATELETS: 194 10*3/uL (ref 150–440)
RBC: 5.52 MIL/uL (ref 4.40–5.90)
RDW: 15.3 % — AB (ref 11.5–14.5)
WBC: 6.4 10*3/uL (ref 3.8–10.6)

## 2016-10-15 LAB — BRAIN NATRIURETIC PEPTIDE: B NATRIURETIC PEPTIDE 5: 15 pg/mL (ref 0.0–100.0)

## 2016-10-15 LAB — TROPONIN I

## 2016-10-15 LAB — TSH: TSH: 1.609 u[IU]/mL (ref 0.350–4.500)

## 2016-10-15 MED ORDER — FERROUS GLUCONATE 324 (38 FE) MG PO TABS
300.0000 mg | ORAL_TABLET | Freq: Every day | ORAL | Status: DC
  Administered 2016-10-15 – 2016-10-20 (×3): 324 mg via ORAL
  Filled 2016-10-15 (×3): qty 1

## 2016-10-15 MED ORDER — ADULT MULTIVITAMIN W/MINERALS CH
1.0000 | ORAL_TABLET | Freq: Every day | ORAL | Status: DC
  Administered 2016-10-15: 1 via ORAL
  Filled 2016-10-15: qty 1

## 2016-10-15 MED ORDER — ALBUTEROL SULFATE (2.5 MG/3ML) 0.083% IN NEBU
2.5000 mg | INHALATION_SOLUTION | RESPIRATORY_TRACT | Status: DC
  Filled 2016-10-15: qty 3

## 2016-10-15 MED ORDER — FUROSEMIDE 20 MG PO TABS: 20.0000 mg | ORAL_TABLET | Freq: Every day | ORAL | Status: DC | PRN

## 2016-10-15 MED ORDER — ONDANSETRON HCL 4 MG/2ML IJ SOLN
4.0000 mg | Freq: Four times a day (QID) | INTRAMUSCULAR | Status: DC | PRN
  Administered 2016-10-28: 4 mg via INTRAVENOUS

## 2016-10-15 MED ORDER — BUDESONIDE 0.25 MG/2ML IN SUSP
0.2500 mg | Freq: Two times a day (BID) | RESPIRATORY_TRACT | Status: DC
  Administered 2016-10-15 – 2016-10-17 (×5): 0.25 mg via RESPIRATORY_TRACT
  Filled 2016-10-15 (×5): qty 2

## 2016-10-15 MED ORDER — PREDNISONE 20 MG PO TABS: 30.0000 mg | ORAL_TABLET | Freq: Every day | ORAL | Status: DC

## 2016-10-15 MED ORDER — PANTOPRAZOLE SODIUM 40 MG PO TBEC
40.0000 mg | DELAYED_RELEASE_TABLET | Freq: Every day | ORAL | Status: DC
  Administered 2016-10-15: 40 mg via ORAL
  Filled 2016-10-15: qty 1

## 2016-10-15 MED ORDER — ENSURE ENLIVE PO LIQD: 237.0000 mL | Freq: Three times a day (TID) | ORAL | Status: DC

## 2016-10-15 MED ORDER — PREDNISONE 20 MG PO TABS: 20.0000 mg | ORAL_TABLET | Freq: Every day | ORAL | Status: DC

## 2016-10-15 MED ORDER — MAGNESIUM SULFATE 2 GM/50ML IV SOLN
2.0000 g | Freq: Once | INTRAVENOUS | Status: AC
  Administered 2016-10-15: 2 g via INTRAVENOUS
  Filled 2016-10-15: qty 50

## 2016-10-15 MED ORDER — ALBUTEROL SULFATE (2.5 MG/3ML) 0.083% IN NEBU
2.5000 mg | INHALATION_SOLUTION | Freq: Four times a day (QID) | RESPIRATORY_TRACT | Status: DC
  Administered 2016-10-15 (×2): 2.5 mg via RESPIRATORY_TRACT
  Filled 2016-10-15 (×2): qty 3

## 2016-10-15 MED ORDER — DOCUSATE SODIUM 100 MG PO CAPS
100.0000 mg | ORAL_CAPSULE | Freq: Two times a day (BID) | ORAL | Status: DC
  Administered 2016-10-15 – 2016-10-16 (×3): 100 mg via ORAL
  Filled 2016-10-15 (×3): qty 1

## 2016-10-15 MED ORDER — PREDNISONE 50 MG PO TABS
50.0000 mg | ORAL_TABLET | Freq: Every day | ORAL | Status: AC
  Administered 2016-10-15: 50 mg via ORAL
  Filled 2016-10-15: qty 1

## 2016-10-15 MED ORDER — VITAMIN D 1000 UNITS PO TABS
1000.0000 [IU] | ORAL_TABLET | Freq: Every day | ORAL | Status: DC
  Administered 2016-10-15 – 2016-10-20 (×3): 1000 [IU] via ORAL
  Filled 2016-10-15 (×3): qty 1

## 2016-10-15 MED ORDER — ACETAMINOPHEN 325 MG PO TABS
650.0000 mg | ORAL_TABLET | Freq: Four times a day (QID) | ORAL | Status: DC | PRN
  Administered 2016-10-15 – 2016-10-17 (×3): 650 mg via ORAL
  Filled 2016-10-15 (×3): qty 2

## 2016-10-15 MED ORDER — LEVOFLOXACIN 500 MG PO TABS
500.0000 mg | ORAL_TABLET | Freq: Every day | ORAL | Status: DC
  Administered 2016-10-15: 500 mg via ORAL
  Filled 2016-10-15: qty 1

## 2016-10-15 MED ORDER — LORATADINE 10 MG PO TABS
10.0000 mg | ORAL_TABLET | Freq: Every day | ORAL | Status: DC
  Administered 2016-10-15: 10 mg via ORAL
  Filled 2016-10-15: qty 1

## 2016-10-15 MED ORDER — ALBUTEROL SULFATE (2.5 MG/3ML) 0.083% IN NEBU
2.5000 mg | INHALATION_SOLUTION | RESPIRATORY_TRACT | Status: DC | PRN
Start: 2016-10-15 — End: 2016-10-15

## 2016-10-15 MED ORDER — ENOXAPARIN SODIUM 40 MG/0.4ML ~~LOC~~ SOLN
40.0000 mg | SUBCUTANEOUS | Status: DC
  Administered 2016-10-15 – 2016-10-16 (×2): 40 mg via SUBCUTANEOUS
  Filled 2016-10-15 (×2): qty 0.4

## 2016-10-15 MED ORDER — MOMETASONE FURO-FORMOTEROL FUM 200-5 MCG/ACT IN AERO
2.0000 | INHALATION_SPRAY | Freq: Two times a day (BID) | RESPIRATORY_TRACT | Status: DC
  Administered 2016-10-15: 2 via RESPIRATORY_TRACT
  Filled 2016-10-15: qty 8.8

## 2016-10-15 MED ORDER — ALBUTEROL SULFATE (2.5 MG/3ML) 0.083% IN NEBU
2.5000 mg | INHALATION_SOLUTION | RESPIRATORY_TRACT | Status: DC | PRN
  Administered 2016-10-15: 2.5 mg via RESPIRATORY_TRACT
  Filled 2016-10-15: qty 3

## 2016-10-15 MED ORDER — PREDNISONE 20 MG PO TABS: 40.0000 mg | ORAL_TABLET | Freq: Every day | ORAL | Status: DC

## 2016-10-15 MED ORDER — TIOTROPIUM BROMIDE MONOHYDRATE 18 MCG IN CAPS
18.0000 ug | ORAL_CAPSULE | Freq: Every day | RESPIRATORY_TRACT | Status: DC
  Administered 2016-10-15: 18 ug via RESPIRATORY_TRACT
  Filled 2016-10-15: qty 5

## 2016-10-15 MED ORDER — PREDNISONE 10 MG PO TABS: 5.0000 mg | ORAL_TABLET | Freq: Every day | ORAL | Status: DC

## 2016-10-15 MED ORDER — PREDNISONE 20 MG PO TABS: 10.0000 mg | ORAL_TABLET | Freq: Every day | ORAL | Status: DC

## 2016-10-15 MED ORDER — ONDANSETRON HCL 4 MG PO TABS: 4.0000 mg | ORAL_TABLET | Freq: Four times a day (QID) | ORAL | Status: DC | PRN

## 2016-10-15 MED ORDER — ACETAMINOPHEN 650 MG RE SUPP: 650.0000 mg | Freq: Four times a day (QID) | RECTAL | Status: DC | PRN

## 2016-10-15 MED ORDER — GABAPENTIN 300 MG PO CAPS
900.0000 mg | ORAL_CAPSULE | Freq: Three times a day (TID) | ORAL | Status: DC
  Administered 2016-10-15 (×3): 900 mg via ORAL
  Filled 2016-10-15 (×3): qty 3

## 2016-10-15 MED ORDER — IPRATROPIUM-ALBUTEROL 0.5-2.5 (3) MG/3ML IN SOLN
3.0000 mL | Freq: Once | RESPIRATORY_TRACT | Status: AC
  Administered 2016-10-15: 3 mL via RESPIRATORY_TRACT
  Filled 2016-10-15: qty 3

## 2016-10-15 MED ORDER — ALBUTEROL SULFATE (2.5 MG/3ML) 0.083% IN NEBU
10.0000 mg | INHALATION_SOLUTION | Freq: Once | RESPIRATORY_TRACT | Status: AC
  Administered 2016-10-15: 10 mg via RESPIRATORY_TRACT
  Filled 2016-10-15: qty 12

## 2016-10-15 MED ORDER — METHOCARBAMOL 500 MG PO TABS
500.0000 mg | ORAL_TABLET | Freq: Four times a day (QID) | ORAL | Status: DC | PRN
  Administered 2016-10-15 – 2016-10-16 (×2): 500 mg via ORAL
  Filled 2016-10-15 (×2): qty 1

## 2016-10-15 MED ORDER — NICOTINE 21 MG/24HR TD PT24
21.0000 mg | MEDICATED_PATCH | Freq: Every day | TRANSDERMAL | Status: DC
  Administered 2016-10-15 – 2016-10-16 (×2): 21 mg via TRANSDERMAL
  Filled 2016-10-15 (×2): qty 1

## 2016-10-15 MED ORDER — BOOST / RESOURCE BREEZE PO LIQD
1.0000 | Freq: Three times a day (TID) | ORAL | Status: DC
  Administered 2016-10-15: 1 via ORAL

## 2016-10-15 NOTE — Progress Notes (Signed)
[facility] [place] [facility]   PATIENT NAME: Kevin BurrowJoseph Holden    MR#:  [id number]  DATE OF BIRTH:  12-09-1950  SUBJECTIVE:  CHIEF COMPLAINT:   Chief Complaint  Patient presents with  . Shortness of Breath  The patient is 66 year old American male with medical history significant for ongoing tobacco abuse, COPD, hypertension, who presented to the hospital with complaints of shortness of breath. He admits of cough and green phlegm production. He also admits of lower extremity swelling, wheezing. Chest x-ray revealed hyperinflation, COPD, no infiltrates or CHF were noted. The patient was initiated on steroids, inhalation therapy, oxygen, Lasix and improved clinically   Review of Systems  Constitutional: Negative for chills, fever and weight loss.  HENT: Negative for congestion.   Eyes: Negative for blurred vision and double vision.  Respiratory: Positive for cough, sputum production, shortness of breath and wheezing.   Cardiovascular: Positive for leg swelling. Negative for chest pain, palpitations, orthopnea and PND.  Gastrointestinal: Negative for abdominal pain, blood in stool, constipation, diarrhea, nausea and vomiting.  Genitourinary: Negative for dysuria, frequency, hematuria and urgency.  Musculoskeletal: Negative for falls.  Neurological: Negative for dizziness, tremors, focal weakness and headaches.  Endo/Heme/Allergies: Does not bruise/bleed easily.  Psychiatric/Behavioral: Negative for depression. The patient does not have insomnia.     VITAL SIGNS: Blood pressure 121/90, pulse 97, temperature 98 F (36.7 C), temperature source Oral, resp. rate 12, height 6' (1.829 m), weight 54.4 kg (120 lb), SpO2 94 %.  PHYSICAL EXAMINATION:   GENERAL:  66 y.o.-year-old patient lying in the bein mild-to-moderate respiratory distress Eyes: Pupils equal, round, reactive to light and accommodation. No scleral icterus. Extraocular muscles intact.  HEENT:  Head atraumatic, normocephalic. Oropharynx and nasopharynx clear.  NECK:  Supple, no jugular venous distention. No thyroid enlargement, no tenderness.  LUNmildly diminishedbreath sounds bilaterally, bilateral scattered wheezing, rales,rhonchi , , but no crepitations . Using accessory muscles of respiration.  CARDIOVASCULAR: S1, S2 normal. No murmurs, rubs, or gallops.  ABDOMEN: Soft, nontender, nondistended. Bowel sounds present. No organomegaly or mass.  EXTREMITIES: . Trace lower extremity and pedal edema, No cyanosis, or clubbing.  NEUROLOGIC: Cranial nerves II through XII are intact. Muscle strength 5/5 in all extremities. Sensation intact. Gait not checked.  PSYCHIATRIC: The patient is alert and oriented x 3.  SKIN: No obvious rash, lesion, or ulcer.   ORDERS/RESULTS REVIEWED:   CBC  Recent Labs Lab 10/15/16 0433  WBC 6.4  HGB 15.5  HCT 45.9  PLT 194  MCV 83.1  MCH 28.0  MCHC 33.7  RDW 15.3*  LYMPHSABS 1.4  MONOABS 0.6  EOSABS 0.0  BASOSABS 0.0   ------------------------------------------------------------------------------------------------------------------  Chemistries   Recent Labs Lab 10/15/16 0433  NA 138  K 4.1  CL 102  CO2 26  GLUCOSE 143*  BUN 16  CREATININE 1.11  CALCIUM 8.6*  AST 26  ALT 14*  ALKPHOS 75  BILITOT 0.5   ------------------------------------------------------------------------------------------------------------------ estimated creatinine clearance is 51.1 mL/min (by C-G formula based on SCr of 1.11 mg/dL). ------------------------------------------------------------------------------------------------------------------  Recent Labs  10/15/16 0433  TSH 1.609    Cardiac Enzymes  Recent Labs Lab 10/15/16 0433  TROPONINI <0.03   ------------------------------------------------------------------------------------------------------------------ Invalid input(s):  POCBNP ---------------------------------------------------------------------------------------------------------------  RADIOLOGY: Dg Chest 2 View  Result Date: 10/15/2016 CLINICAL DATA:  Shortness of breath EXAM: CHEST  2 VIEW COMPARISON:  Chest radiograph 10/04/2016 FINDINGS: The lungs are hyperexpanded. There are prominent pulmonary markings, unchanged. No consolidation or pulmonary edema. No pneumothorax or pleural  effusion. IMPRESSION: Hyperinflation, consistent with COPD.  No focal consolidation. Electronically Signed   By: Deatra Robinson M.D.   On: 10/15/2016 05:05    EKG:  Orders placed or performed during the hospital encounter of 10/15/16  . ED EKG  . ED EKG  . EKG 12-Lead  . EKG 12-Lead    ASSESSMENT AND PLAN:  Active Problems:   Acute on chronic respiratory failure with hypoxemia (HCC)  #1. Acute on chronic respiratory failure with hypoxia due to COPD exacerbation, continue oxygen therapy, weaning off oxygen as tolerated   #2. COPD exacerbation due to acute bronchitis, initiate patient on levofloxacin, Pulmicort, DuoNeb's, prednisone, Spiriva, follow clinically  #3. Acute bronchitis, initiate levofloxacin, get sputum cultures if possible  #4. Lower extremity swelling, likely due to right-sided heart failure, echocardiogram is pending, suspect significant pulmonary hypertension, improved with Lasix  #5. Tobacco abuse. Counseling, discussed with patient for 4 minutes, nicotine replacement therapy will be initiated, he is agreeable    Management plans discussed with the patient, family and they are in agreement.   DRUG ALLERGIES:  Allergies  Allergen Reactions  . Aspirin Other (See Comments)    Unknown  . Celecoxib Other (See Comments)    Reaction: Unknown  . Nsaids Other (See Comments)    Reaction: Unknown    CODE STATUS:     Code Status Orders        Start     Ordered   10/15/16 0823  Full code  Continuous     10/15/16 0822    Code Status History     Date Active Date Inactive Code Status Order ID Comments User Context   10/04/2016  4:43 AM 10/05/2016  2:59 PM Full Code [id number]  Arnaldo Natal, MD Inpatient   05/27/2016  9:28 AM 05/27/2016  2:35 PM Full Code [id number]  Annice Needy, MD Inpatient   11/10/2011  9:36 AM 11/11/2011  4:58 PM Full Code [id number]  Christiane Ha, MD Inpatient      TOTAL TIME TAKING CARE OF THIS PATIENT:40 minutes.    Katharina Caper M.D on 10/15/2016 at 2:37 PM  Between 7am to 6pm - Pager - [phone]  After 6pm go to [email or web address] - password [email or web address]  [facility]  Office  [phone]  CC: Primary care physician; Leanna Sato, MD

## 2016-10-15 NOTE — ED Provider Notes (Signed)
[facility] Emergency Department Provider Note  ____________________________________________   First MD Initiated Contact with Patient 10/15/16 [phone]     (approximate)  I have reviewed the triage vital signs and the nursing notes.   HISTORY  Chief Complaint Shortness of Breath    HPI Kevin Holden is a 66 y.o. male with a history of COPD but who does not use oxygen at home who presents by EMS with severe respiratory distress.He reports being discharged from the hospital a few days ago.  He states he was fine for 2 days but starting about 24 hours ago he developed worsening shortness of breath that gradually got worse and is now severe.  When EMS arrived he had an SPO2 of 80% on room air.  He received one albuterol nebulizer, 1 DuoNeb, and Solu-Medrol 125 mg IV en route.  His oxygen saturation is in the low 90s on 4 L by nasal cannula but his work of breathing has improved.  He also describes some chest tightness associated with the shortness of breath.  He denies fever/chills, nausea, vomiting, abdominal pain, dysuria.  He states his symptoms are severe and his regular inhalers are not making him feel better.   Past Medical History:  Diagnosis Date  . Asthma   . COPD (chronic obstructive pulmonary disease) (HCC)   . HTN (hypertension)   . Pneumonia     Patient Active Problem List   Diagnosis Date Noted  . Acute on chronic respiratory failure with hypoxemia (HCC) 10/15/2016  . Acute on chronic respiratory failure with hypoxia (HCC) 10/04/2016  . PAD (peripheral artery disease) (HCC) 06/24/2016  . Ulcer of right foot with necrosis of muscle (HCC) 05/11/2016  . Atherosclerosis of native arteries of the extremities with ulceration (HCC) 05/11/2016  . CAP (community acquired pneumonia) 11/10/2011  . Hypoxia 11/10/2011  . Lung nodule 11/10/2011  . Current every day smoker 11/10/2011  . COPD (chronic obstructive pulmonary disease) (HCC) 11/10/2011  . Benign  hypertension 11/10/2011    Past Surgical History:  Procedure Laterality Date  . ABDOMINAL SURGERY    . BACK SURGERY    . PERIPHERAL VASCULAR CATHETERIZATION Right 05/27/2016   Procedure: Lower Extremity Angiography;  Surgeon: Annice Needy, MD;  Location: [facility] INVASIVE CV LAB;  Service: Cardiovascular;  Laterality: Right;  . PERIPHERAL VASCULAR CATHETERIZATION N/A 05/27/2016   Procedure: Abdominal Aortogram w/Lower Extremity;  Surgeon: Annice Needy, MD;  Location: [facility] INVASIVE CV LAB;  Service: Cardiovascular;  Laterality: N/A;  . PERIPHERAL VASCULAR CATHETERIZATION  05/27/2016   Procedure: Lower Extremity Intervention;  Surgeon: Annice Needy, MD;  Location: [facility] INVASIVE CV LAB;  Service: Cardiovascular;;  . VASCULAR SURGERY     2016 balloon in vein in right leg    Prior to Admission medications   Medication Sig Start Date End Date Taking? Authorizing Provider  albuterol (PROVENTIL HFA;VENTOLIN HFA) 108 (90 Base) MCG/ACT inhaler Inhale 1-2 puffs into the lungs every 6 (six) hours as needed for wheezing or shortness of breath. 10/05/16 11/05/16 Yes Mody, Patricia Pesa, MD  cholecalciferol (VITAMIN D) 1000 units tablet Take 1,000 Units by mouth daily.   Yes [provider]  Ferrous Gluconate (IRON 27 PO) Take 1 tablet by mouth daily.   Yes [provider]  furosemide (LASIX) 20 MG tablet Take 20 mg by mouth daily as needed for edema.  10/12/16  Yes [provider]  gabapentin (NEURONTIN) 300 MG capsule Take 900 mg by mouth 3 (three) times daily.  Yes [provider]  loratadine (CLARITIN) 10 MG tablet Take 10 mg by mouth daily.    Yes [provider]  methocarbamol (ROBAXIN) 500 MG tablet Take 500 mg by mouth every 6 (six) hours as needed for muscle spasms.   Yes [provider]  mometasone-formoterol (DULERA) 200-5 MCG/ACT AERO Inhale 2 puffs into the lungs 2 (two) times daily.   Yes [provider]  omeprazole (PRILOSEC) 20 MG capsule Take  20 mg by mouth daily.   Yes [provider]  tiotropium (SPIRIVA) 18 MCG inhalation capsule Place 18 mcg into inhaler and inhale daily.   Yes [provider]    Allergies Aspirin; Celecoxib; and Nsaids  No family history on file.  Social History Social History  Substance Use Topics  . Smoking status: Current Every Day Smoker    Packs/day: 1.00    Years: 40.00    Types: Cigarettes  . Smokeless tobacco: Never Used  . Alcohol use No    Review of Systems Constitutional: No fever/chills Eyes: No visual changes. ENT: No sore throat. Cardiovascular: Some chest tightness and pressure Respiratory: Gradually worsening shortness of breath over the last 24 hours Gastrointestinal: No abdominal pain.  No nausea, no vomiting.  No diarrhea.  No constipation. Genitourinary: Negative for dysuria. Musculoskeletal: Negative for neck pain.  Negative for back pain. Integumentary: Negative for rash. Neurological: Negative for headaches, focal weakness or numbness.   ____________________________________________   PHYSICAL EXAM:  VITAL SIGNS: ED Triage Vitals  Enc Vitals Group     BP 10/15/16 0427 (!) 147/94     Pulse Rate 10/15/16 0427 (!) 114     Resp 10/15/16 0427 (!) 24     Temp 10/15/16 0427 97.8 F (36.6 C)     Temp Source 10/15/16 0427 Oral     SpO2 10/15/16 0427 93 %     Weight 10/15/16 0428 130 lb (59 kg)     Height 10/15/16 0428 6' (1.829 m)     Head Circumference --      Peak Flow --      Pain Score 10/15/16 0426 7     Pain Loc --      Pain Edu? --      Excl. in GC? --     Constitutional: Alert and oriented and surprisingly cheerful.  Appears older than chronological age, no current respiratory distress.  Thin body habitus Eyes: Conjunctivae are normal.  Head: Atraumatic. Cardiovascular: Mild tachycardia, regular rhythm. Good peripheral circulation. Grossly normal heart sounds. Respiratory: Normal respiratory effort.  No retractions.  Diminished  breath sounds throughout with expiratory wheezing Gastrointestinal: Soft and nontender. No distention.  Musculoskeletal: No lower extremity tenderness nor edema. No gross deformities of extremities. Neurologic:  Normal speech and language. No gross focal neurologic deficits are appreciated.  Skin:  Skin is warm, dry and intact. No rash noted. Psychiatric: Mood and affect are normal. Speech and behavior are normal.  ____________________________________________   LABS (all labs ordered are listed, but only abnormal results are displayed)  Labs Reviewed  CBC WITH DIFFERENTIAL/PLATELET - Abnormal; Notable for the following:       Result Value   RDW 15.3 (*)    All other components within normal limits  COMPREHENSIVE METABOLIC PANEL - Abnormal; Notable for the following:    Glucose, Bld 143 (*)    Calcium 8.6 (*)    ALT 14 (*)    All other components within normal limits  TROPONIN I  BRAIN NATRIURETIC PEPTIDE  ____________________________________________  EKG  ED ECG REPORT I, [place] Welcome, the attending physician, personally viewed and interpreted this ECG.  Date: 10/15/2016 EKG Time: 4:26 AM Rate: 116 Rhythm: Sinus tachycardia QRS Axis: normal Intervals: normal ST/T Wave abnormalities: Non-specific ST segment / T-wave changes, but no evidence of acute ischemia. Conduction Disturbances: none Narrative Interpretation: unremarkable  ____________________________________________  RADIOLOGY   Dg Chest 2 View  Result Date: 10/15/2016 CLINICAL DATA:  Shortness of breath EXAM: CHEST  2 VIEW COMPARISON:  Chest radiograph 10/04/2016 FINDINGS: The lungs are hyperexpanded. There are prominent pulmonary markings, unchanged. No consolidation or pulmonary edema. No pneumothorax or pleural effusion. IMPRESSION: Hyperinflation, consistent with COPD.  No focal consolidation. Electronically Signed   By: Deatra Robinson M.D.   On: 10/15/2016 05:05     ____________________________________________   PROCEDURES  Critical Care performed: Yes, see critical care procedure note(s)   Procedure(s) performed:   .Critical Care Performed by: Loleta Rose Authorized by: Loleta Rose   Critical care provider statement:    Critical care time (minutes):  30   Critical care time was exclusive of:  Separately billable procedures and treating other patients   Critical care was necessary to treat or prevent imminent or life-threatening deterioration of the following conditions:  Respiratory failure   Critical care was time spent personally by me on the following activities:  Development of treatment plan with patient or surrogate, discussions with consultants, evaluation of patient's response to treatment, examination of patient, obtaining history from patient or surrogate, ordering and performing treatments and interventions, ordering and review of laboratory studies, ordering and review of radiographic studies, pulse oximetry, re-evaluation of patient's condition and review of old charts      ____________________________________________   INITIAL IMPRESSION / ASSESSMENT AND PLAN / ED COURSE  Pertinent labs & imaging results that were available during my care of the patient were reviewed by me and considered in my medical decision making (see chart for details).  I will proceed with standard COPD treatment and workup and reassess.  I am giving the patient another 2 DuoNeb's.  Chest x-ray unremarkable.   Clinical Course as of Oct 16 754  Fri Oct 15, 2016  0608 After 3 breathing treatments and Solu-Medrol 125 mg IV, the patient is breathing more comfortably but he has 91% on 4 L of oxygen by nasal cannula with a good waveform.  I turned off the oxygen and he immediately dropped down into the mid 80s and only slowly came back up after I turned the oxygen back on.  I will start more albuterol, magnesium sulfate 2 g IV, and admit for further  management of his acute on chronic respiratory failure with hypoxemia and COPD exacerbation.  [CF]    Clinical Course User Index [CF] Loleta Rose, MD    ____________________________________________  FINAL CLINICAL IMPRESSION(S) / ED DIAGNOSES  Final diagnoses:  Acute on chronic respiratory failure with hypoxemia (HCC)  COPD exacerbation (HCC)     MEDICATIONS GIVEN DURING THIS VISIT:  Medications  ipratropium-albuterol (DUONEB) 0.5-2.5 (3) MG/3ML nebulizer solution 3 mL (3 mLs Nebulization Given 10/15/16 0508)  ipratropium-albuterol (DUONEB) 0.5-2.5 (3) MG/3ML nebulizer solution 3 mL (3 mLs Nebulization Given 10/15/16 0508)  albuterol (PROVENTIL) (2.5 MG/3ML) 0.083% nebulizer solution 10 mg (10 mg Nebulization Given 10/15/16 0612)  magnesium sulfate IVPB 2 g 50 mL (0 g Intravenous Stopped 10/15/16 0706)     NEW OUTPATIENT MEDICATIONS STARTED DURING THIS VISIT:  New Prescriptions   No medications on file    Modified  Medications   No medications on file    Discontinued Medications   AZITHROMYCIN (ZITHROMAX) 250 MG TABLET    Take 1 tablet daily for 3 days   BUDESONIDE-FORMOTEROL (SYMBICORT) 160-4.5 MCG/ACT INHALER    Inhale 2 puffs into the lungs daily.     Note:  This document was prepared using Dragon voice recognition software and may include unintentional dictation errors.    Loleta Rose, MD 10/15/16 [phone]

## 2016-10-15 NOTE — Progress Notes (Addendum)
Initial Nutrition Assessment  DOCUMENTATION CODES:   Severe malnutrition in context of chronic illness  INTERVENTION:   Boost Breeze po TID, each supplement provides 250 kcal and 9 grams of protein  MVI  NUTRITION DIAGNOSIS:   Malnutrition (severe) related to catabolic illness, COPD as evidenced by severe depletion of muscle mass, severe depletion of body fat.  GOAL:   Patient will meet greater than or equal to 90% of their needs  MONITOR:   PO intake, Supplement acceptance, Labs, Weight trends  REASON FOR ASSESSMENT:   Other (Comment) (low BMI)    ASSESSMENT:   66 year old male with h/o HTN and COPD admitted for acute on chronic respiratory failure with hypoxemia.   RD visited pt's room today. Pt with severe muscle and fat wasting over entire body. Pt currently eating 100% meals. RD will order Boost Breeze; per previous RD note, pt does not like Ensure. Per chart, pt is weight stable.   Medications reviewed and include: Vit D, colace, lovenox, ferrous gluconate, protonix, prednisone  Labs reviewed: Ca 8.6(L)  Nutrition-Focused physical exam completed. Findings are severe fat depletion, severe muscle depletion, and no edema.   Diet Order:  Diet Heart Room service appropriate? Yes; Fluid consistency: Thin  Skin:  Reviewed, no issues  Last BM:  5/17  Height:   Ht Readings from Last 1 Encounters:  10/15/16 6' (1.829 m)    Weight:   Wt Readings from Last 1 Encounters:  10/15/16 120 lb (54.4 kg)    Ideal Body Weight:  80.9 kg  BMI:  Body mass index is 16.27 kg/m.  Estimated Nutritional Needs:   Kcal:  1900-2200kcal/day   Protein:  98-109g/day   Fluid:  >1.9L/day   EDUCATION NEEDS:   Education needs no appropriate at this time  Betsey Holidayasey Alanny Rivers MS, RD, LDN Pager #- [phone]

## 2016-10-15 NOTE — Progress Notes (Signed)
Patient is A&O x4. Up with SBA and cane. On 4L O2, sats 94%. Uses urinal bedside. C/O muscle spasms, gave oral meds, with good relief. Non-productive cough. Lungs have exp. Wheezes.

## 2016-10-15 NOTE — ED Notes (Signed)
Pt in XR at this time

## 2016-10-15 NOTE — H&P (Signed)
Kevin Holden is an 66 y.o. male.   Chief Complaint: Shortness of breath HPI: The patient with past medical history significant for COPD and hypertension presents to the emergency department complaining of shortness of breath. The patient was recently discharged from this facility after treatment of COPD exacerbation. He was completed a prednisone taper as well as azithromycin but reports that for the last few days he has awakened at night feeling feverish. He he admits to a cough but he reports that he is unable to bring anything up. The patient also admits to having swelling in his lower extremities. With this exacerbation he describes a feeling of tightness rising from his thighs into his abdomen and then into his chest where as on previous occasions he feels as if the swelling were occurring "from the top down". Upon discharge he was prescribed low-dose Lasix presumably for venous insufficiency but he states that this only helps sometimes. In the emergency department the patient was found to have oxygen saturations in the low 80s. He was placed on 4 L of oxygen via nasal cannula which improved his oxygen saturations prior to the emergency department staff calling the hospitalist service for admission.  Past Medical History:  Diagnosis Date  . Asthma   . COPD (chronic obstructive pulmonary disease) ([place])   . HTN (hypertension)   . Pneumonia     Past Surgical History:  Procedure Laterality Date  . ABDOMINAL SURGERY    . BACK SURGERY    . PERIPHERAL VASCULAR CATHETERIZATION Right 05/27/2016   Procedure: Lower Extremity Angiography;  Surgeon: Algernon Huxley, MD;  Location: [facility];  Service: Cardiovascular;  Laterality: Right;  . PERIPHERAL VASCULAR CATHETERIZATION N/A 05/27/2016   Procedure: Abdominal Aortogram w/Lower Extremity;  Surgeon: Algernon Huxley, MD;  Location: [facility];  Service: Cardiovascular;  Laterality: N/A;  . PERIPHERAL VASCULAR CATHETERIZATION  05/27/2016   Procedure: Lower Extremity Intervention;  Surgeon: Algernon Huxley, MD;  Location: [facility];  Service: Cardiovascular;;  . VASCULAR SURGERY     2016 balloon in vein in right leg    No family history on file. None  Social History:  reports that he has been smoking Cigarettes.  He has a 40.00 pack-year smoking history. He has never used smokeless tobacco. He reports that he does not drink alcohol or use drugs.  Allergies:  Allergies  Allergen Reactions  . Aspirin Other (See Comments)    Unknown  . Celecoxib Other (See Comments)    Reaction: Unknown  . Nsaids Other (See Comments)    Reaction: Unknown    Prior to Admission medications   Medication Sig Start Date End Date Taking? Authorizing Provider  albuterol (PROVENTIL HFA;VENTOLIN HFA) 108 (90 Base) MCG/ACT inhaler Inhale 1-2 puffs into the lungs every 6 (six) hours as needed for wheezing or shortness of breath. 10/05/16 11/05/16 Yes Mody, Ulice Bold, MD  cholecalciferol (VITAMIN D) 1000 units tablet Take 1,000 Units by mouth daily.   Yes [provider]  Ferrous Gluconate (IRON 27 PO) Take 1 tablet by mouth daily.   Yes [provider]  furosemide (LASIX) 20 MG tablet Take 20 mg by mouth daily as needed for edema.  10/12/16  Yes [provider]  gabapentin (NEURONTIN) 300 MG capsule Take 900 mg by mouth 3 (three) times daily.    Yes [provider]  loratadine (CLARITIN) 10 MG tablet Take 10 mg by mouth daily.    Yes [provider]  methocarbamol (ROBAXIN)  500 MG tablet Take 500 mg by mouth every 6 (six) hours as needed for muscle spasms.   Yes [provider]  mometasone-formoterol (DULERA) 200-5 MCG/ACT AERO Inhale 2 puffs into the lungs 2 (two) times daily.   Yes [provider]  omeprazole (PRILOSEC) 20 MG capsule Take 20 mg by mouth daily.   Yes [provider]  tiotropium (SPIRIVA) 18 MCG inhalation capsule Place 18 mcg into inhaler and inhale daily.   Yes  [provider]     Results for orders placed or performed during the hospital encounter of 10/15/16 (from the past 48 hour(s))  CBC with Differential/Platelet     Status: Abnormal   Collection Time: 10/15/16  4:33 AM  Result Value Ref Range   WBC 6.4 3.8 - 10.6 K/uL   RBC 5.52 4.40 - 5.90 MIL/uL   Hemoglobin 15.5 13.0 - 18.0 g/dL   HCT 45.9 40.0 - 52.0 %   MCV 83.1 80.0 - 100.0 fL   MCH 28.0 26.0 - 34.0 pg   MCHC 33.7 32.0 - 36.0 g/dL   RDW 15.3 (H) 11.5 - 14.5 %   Platelets 194 150 - 440 K/uL   Neutrophils Relative % 67 %   Neutro Abs 4.4 1.4 - 6.5 K/uL   Lymphocytes Relative 21 %   Lymphs Abs 1.4 1.0 - 3.6 K/uL   Monocytes Relative 10 %   Monocytes Absolute 0.6 0.2 - 1.0 K/uL   Eosinophils Relative 1 %   Eosinophils Absolute 0.0 0 - 0.7 K/uL   Basophils Relative 1 %   Basophils Absolute 0.0 0 - 0.1 K/uL  Troponin I     Status: None   Collection Time: 10/15/16  4:33 AM  Result Value Ref Range   Troponin I <0.03 <0.03 ng/mL  Brain natriuretic peptide     Status: None   Collection Time: 10/15/16  4:33 AM  Result Value Ref Range   B Natriuretic Peptide 15.0 0.0 - 100.0 pg/mL  Comprehensive metabolic panel     Status: Abnormal   Collection Time: 10/15/16  4:33 AM  Result Value Ref Range   Sodium 138 135 - 145 mmol/L   Potassium 4.1 3.5 - 5.1 mmol/L   Chloride 102 101 - 111 mmol/L   CO2 26 22 - 32 mmol/L   Glucose, Bld 143 (H) 65 - 99 mg/dL   BUN 16 6 - 20 mg/dL   Creatinine, Ser 1.11 0.61 - 1.24 mg/dL   Calcium 8.6 (L) 8.9 - 10.3 mg/dL   Total Protein 7.9 6.5 - 8.1 g/dL   Albumin 3.7 3.5 - 5.0 g/dL   AST 26 15 - 41 U/L   ALT 14 (L) 17 - 63 U/L   Alkaline Phosphatase 75 38 - 126 U/L   Total Bilirubin 0.5 0.3 - 1.2 mg/dL   GFR calc non Af Amer >60 >60 mL/min   GFR calc Af Amer >60 >60 mL/min    Comment: (NOTE) The eGFR has been calculated using the CKD EPI equation. This calculation has not been validated in all clinical situations. eGFR's persistently  <60 mL/min signify possible Chronic Kidney Disease.    Anion gap 10 5 - 15   Dg Chest 2 View  Result Date: 10/15/2016 CLINICAL DATA:  Shortness of breath EXAM: CHEST  2 VIEW COMPARISON:  Chest radiograph 10/04/2016 FINDINGS: The lungs are hyperexpanded. There are prominent pulmonary markings, unchanged. No consolidation or pulmonary edema. No pneumothorax or pleural effusion. IMPRESSION: Hyperinflation, consistent with COPD.  No  focal consolidation. Electronically Signed   By: Ulyses Jarred M.D.   On: 10/15/2016 05:05    Review of Systems  Constitutional: Positive for fever (subjective). Negative for chills.  HENT: Negative for sore throat and tinnitus.   Eyes: Negative for blurred vision and redness.  Respiratory: Negative for cough and shortness of breath.   Cardiovascular: Negative for chest pain, palpitations, orthopnea and PND.  Gastrointestinal: Negative for abdominal pain, diarrhea, nausea and vomiting.  Genitourinary: Negative for dysuria, frequency and urgency.  Musculoskeletal: Negative for joint pain and myalgias.  Skin: Negative for rash.       No lesions  Neurological: Negative for speech change, focal weakness and weakness.  Endo/Heme/Allergies: Does not bruise/bleed easily.       No temperature intolerance  Psychiatric/Behavioral: Negative for depression and suicidal ideas.    Blood pressure 136/81, pulse (!) 103, temperature 97.8 F (36.6 C), temperature source Oral, resp. rate 12, height 6' (1.829 m), weight 59 kg (130 lb), SpO2 94 %. Physical Exam  Constitutional: He is oriented to person, place, and time. He appears well-developed and well-nourished. No distress.  HENT:  Head: Normocephalic and atraumatic.  Mouth/Throat: Oropharynx is clear and moist.  Eyes: Conjunctivae and EOM are normal. Pupils are equal, round, and reactive to light. No scleral icterus.  Neck: Normal range of motion. Neck supple. No JVD present. No tracheal deviation present. No thyromegaly  present.  Cardiovascular: Normal rate and regular rhythm.  Exam reveals no gallop and no friction rub.   No murmur heard. Respiratory: Effort normal. No respiratory distress. He has wheezes. He has no rales.  GI: Soft. Bowel sounds are normal. He exhibits no distension. There is no tenderness.  Genitourinary:  Genitourinary Comments: Deferred  Musculoskeletal: Normal range of motion. He exhibits no edema.  Lymphadenopathy:    He has no cervical adenopathy.  Neurological: He is alert and oriented to person, place, and time. No cranial nerve deficit.  Skin: Skin is warm and dry. No rash noted. No erythema.  Psychiatric: He has a normal mood and affect. His behavior is normal. Judgment and thought content normal.     Assessment/Plan This is a 66 year old male admitted for acute on chronic respiratory failure with hypoxemia. 1. Acute on chronic respiratory failure: Hypoxemia present. The patient carries the diagnosis of COPD and has wheezing on physical exam today. He has received multiple breathing treatments in the emergency department as well as Solu-Medrol which is made him feel better. However, his report of swelling and description of symptoms are concerning for cardiac etiology as well. The patient denies chest pain. Lower extremities not swollen at this time although his abdomen feels somewhat full. I have ordered an echocardiogram as well as cardiology consult. He may have some diastolic dysfunction. Repeat steroid taper. He is afebrile at this time. Continue inhaled corticosteroid and Spiriva 2. Essential hypertension: Controlled when respiratory rate is also controlled. Continue to monitor. 3. Venous insufficiency: Characterize edema better following echocardiogram. Continue Lasix 4. GERD: Continue PPI per home regimen 5. DVT prophylaxis: Lovenox 6. GI prophylaxis: As above The patient is a full code. Time spent on admission orders and patient care approximately 45 minutes  Harrie Foreman, MD 10/15/2016, 7:06 AM

## 2016-10-15 NOTE — ED Notes (Signed)
PT to ED via [facility] d/t increased SOB since yesterday. PT states recent hospitalization for same. bilat wheezes present, EMS to scene pt 80% room air. 1 albuterol, 1 duoneb, 125 mg solumedrol in route. Pt also c/o bilat pain in the back that radiates to rib cages.

## 2016-10-16 ENCOUNTER — Inpatient Hospital Stay: Payer: Medicare Other

## 2016-10-16 ENCOUNTER — Encounter: Payer: Self-pay | Admitting: Adult Health

## 2016-10-16 DIAGNOSIS — J441 Chronic obstructive pulmonary disease with (acute) exacerbation: Principal | ICD-10-CM

## 2016-10-16 DIAGNOSIS — J9621 Acute and chronic respiratory failure with hypoxia: Secondary | ICD-10-CM

## 2016-10-16 DIAGNOSIS — E43 Unspecified severe protein-calorie malnutrition: Secondary | ICD-10-CM | POA: Insufficient documentation

## 2016-10-16 LAB — COMPREHENSIVE METABOLIC PANEL
ALT: 16 U/L — ABNORMAL LOW (ref 17–63)
AST: 32 U/L (ref 15–41)
Albumin: 4 g/dL (ref 3.5–5.0)
Alkaline Phosphatase: 83 U/L (ref 38–126)
Anion gap: 10 (ref 5–15)
BUN: 24 mg/dL — ABNORMAL HIGH (ref 6–20)
CO2: 33 mmol/L — ABNORMAL HIGH (ref 22–32)
Calcium: 9.1 mg/dL (ref 8.9–10.3)
Chloride: 98 mmol/L — ABNORMAL LOW (ref 101–111)
Creatinine, Ser: 1.33 mg/dL — ABNORMAL HIGH (ref 0.61–1.24)
GFR calc Af Amer: >60 mL/min (ref >60)
GFR calc non Af Amer: 55 mL/min — ABNORMAL LOW (ref >60)
Glucose, Bld: 140 mg/dL — ABNORMAL HIGH (ref 65–99)
Potassium: 5.2 mmol/L — ABNORMAL HIGH (ref 3.5–5.1)
Sodium: 141 mmol/L (ref 135–145)
Total Bilirubin: 0.5 mg/dL (ref 0.3–1.2)
Total Protein: 8.7 g/dL — ABNORMAL HIGH (ref 6.5–8.1)

## 2016-10-16 LAB — GLUCOSE, CAPILLARY
Glucose-Capillary: 107 mg/dL — ABNORMAL HIGH (ref 65–99)
Glucose-Capillary: 113 mg/dL — ABNORMAL HIGH (ref 65–99)
Glucose-Capillary: 114 mg/dL — ABNORMAL HIGH (ref 65–99)
Glucose-Capillary: 124 mg/dL — ABNORMAL HIGH (ref 65–99)
Glucose-Capillary: 136 mg/dL — ABNORMAL HIGH (ref 65–99)
Glucose-Capillary: 137 mg/dL — ABNORMAL HIGH (ref 65–99)

## 2016-10-16 LAB — CBC
HCT: 46.7 % (ref 40.0–52.0)
Hemoglobin: 15.6 g/dL (ref 13.0–18.0)
MCH: 27.6 pg (ref 26.0–34.0)
MCHC: 33.4 g/dL (ref 32.0–36.0)
MCV: 82.8 fL (ref 80.0–100.0)
Platelets: 212 10*3/uL (ref 150–440)
RBC: 5.64 MIL/uL (ref 4.40–5.90)
RDW: 15.3 % — ABNORMAL HIGH (ref 11.5–14.5)
WBC: 17.1 10*3/uL — ABNORMAL HIGH (ref 3.8–10.6)

## 2016-10-16 LAB — BRAIN NATRIURETIC PEPTIDE: B NATRIURETIC PEPTIDE 5: 43 pg/mL (ref 0.0–100.0)

## 2016-10-16 LAB — ECHOCARDIOGRAM COMPLETE
Area-P 1/2: 5 cm2
E decel time: 151 msec
E/e' ratio: 9.78
FS: 42 % (ref 28–44)
Height: 72 in
IVS/LV PW RATIO, ED: 0.95
LA ID, A-P, ES: 25 mm
LA diam end sys: 25 mm
LA diam index: 1.52 cm/m2
LV E/e' medial: 9.78
LV E/e'average: 9.78
LV PW d: 7.89 mm — AB (ref 0.6–1.1)
LV e' LATERAL: 7.83 cm/s
Lateral S' vel: 19.8 cm/s
MV Dec: 151
MV Peak grad: 2 mmHg
MV pk A vel: 88.8 m/s
MV pk E vel: 76.6 m/s
P 1/2 time: 44 ms
TDI e' lateral: 7.83
TDI e' medial: 7.83
Weight: 1920 oz

## 2016-10-16 LAB — MAGNESIUM: MAGNESIUM: 2.5 mg/dL — AB (ref 1.7–2.4)

## 2016-10-16 LAB — PHOSPHORUS: Phosphorus: 4.5 mg/dL (ref 2.5–4.6)

## 2016-10-16 LAB — MRSA PCR SCREENING: MRSA by PCR: NEGATIVE

## 2016-10-16 MED ORDER — VITAL HIGH PROTEIN PO LIQD
1000.0000 mL | ORAL | Status: DC
  Administered 2016-10-16: 1000 mL
  Administered 2016-10-16 – 2016-10-17 (×2)
  Administered 2016-10-17: 1000 mL

## 2016-10-16 MED ORDER — MIDAZOLAM HCL 2 MG/2ML IJ SOLN
2.0000 mg | Freq: Once | INTRAMUSCULAR | Status: AC
  Administered 2016-10-16: 2 mg via INTRAVENOUS

## 2016-10-16 MED ORDER — MAGNESIUM SULFATE 2 GM/50ML IV SOLN
2.0000 g | Freq: Once | INTRAVENOUS | Status: AC
  Administered 2016-10-16: 2 g via INTRAVENOUS
  Filled 2016-10-16: qty 50

## 2016-10-16 MED ORDER — LEVOFLOXACIN IN D5W 250 MG/50ML IV SOLN
250.0000 mg | Freq: Every day | INTRAVENOUS | Status: DC
  Administered 2016-10-16 – 2016-10-18 (×3): 250 mg via INTRAVENOUS
  Filled 2016-10-16 (×4): qty 50

## 2016-10-16 MED ORDER — DEXTROSE 5 % IV SOLN
0.0000 ug/min | INTRAVENOUS | Status: DC
  Administered 2016-10-16: 10 ug/min via INTRAVENOUS
  Administered 2016-10-17: 6 ug/min via INTRAVENOUS
  Filled 2016-10-16 (×3): qty 4

## 2016-10-16 MED ORDER — AZITHROMYCIN 500 MG PO TABS: 500.0000 mg | ORAL_TABLET | Freq: Every day | ORAL | Status: DC

## 2016-10-16 MED ORDER — NITROGLYCERIN 2 % TD OINT
0.5000 [in_us] | TOPICAL_OINTMENT | Freq: Once | TRANSDERMAL | Status: DC
  Administered 2016-10-16: 0.5 [in_us] via TOPICAL
  Filled 2016-10-16: qty 30

## 2016-10-16 MED ORDER — FUROSEMIDE 10 MG/ML IJ SOLN
40.0000 mg | Freq: Once | INTRAMUSCULAR | Status: AC
Start: 2016-10-16 — End: 2016-10-16
  Administered 2016-10-16: 40 mg via INTRAVENOUS
  Filled 2016-10-16: qty 4

## 2016-10-16 MED ORDER — FENTANYL BOLUS VIA INFUSION
50.0000 ug | INTRAVENOUS | Status: DC | PRN
  Filled 2016-10-16: qty 50

## 2016-10-16 MED ORDER — GABAPENTIN 250 MG/5ML PO SOLN
900.0000 mg | Freq: Three times a day (TID) | ORAL | Status: DC
  Administered 2016-10-16 – 2016-10-18 (×8): 900 mg
  Filled 2016-10-16 (×11): qty 18

## 2016-10-16 MED ORDER — LORAZEPAM 2 MG/ML IJ SOLN
1.0000 mg | Freq: Once | INTRAMUSCULAR | Status: AC
  Administered 2016-10-16: 1 mg via INTRAVENOUS
  Filled 2016-10-16: qty 1

## 2016-10-16 MED ORDER — ORAL CARE MOUTH RINSE: 15.0000 mL | Freq: Four times a day (QID) | OROMUCOSAL | Status: DC

## 2016-10-16 MED ORDER — LORAZEPAM 2 MG/ML IJ SOLN: 2.0000 mg | Freq: Once | INTRAMUSCULAR | Status: DC

## 2016-10-16 MED ORDER — INSULIN ASPART 100 UNIT/ML ~~LOC~~ SOLN
0.0000 [IU] | SUBCUTANEOUS | Status: DC
  Administered 2016-10-16 (×2): 2 [IU] via SUBCUTANEOUS
  Administered 2016-10-17 (×2): 3 [IU] via SUBCUTANEOUS
  Administered 2016-10-17 (×2): 2 [IU] via SUBCUTANEOUS
  Administered 2016-10-18 (×2): 3 [IU] via SUBCUTANEOUS
  Administered 2016-10-18: 2 [IU] via SUBCUTANEOUS
  Administered 2016-10-18: 3 [IU] via SUBCUTANEOUS
  Administered 2016-10-18: 2 [IU] via SUBCUTANEOUS
  Administered 2016-10-18: 3 [IU] via SUBCUTANEOUS
  Administered 2016-10-19 (×4): 2 [IU] via SUBCUTANEOUS
  Administered 2016-10-19 – 2016-10-20 (×2): 3 [IU] via SUBCUTANEOUS
  Administered 2016-10-20 (×2): 2 [IU] via SUBCUTANEOUS
  Administered 2016-10-20: 3 [IU] via SUBCUTANEOUS
  Administered 2016-10-20: 5 [IU] via SUBCUTANEOUS
  Administered 2016-10-20 (×2): 3 [IU] via SUBCUTANEOUS
  Administered 2016-10-21 (×4): 2 [IU] via SUBCUTANEOUS
  Administered 2016-10-21: 3 [IU] via SUBCUTANEOUS
  Administered 2016-10-22: 2 [IU] via SUBCUTANEOUS
  Administered 2016-10-22: 3 [IU] via SUBCUTANEOUS
  Administered 2016-10-22: 2 [IU] via SUBCUTANEOUS
  Administered 2016-10-23: 3 [IU] via SUBCUTANEOUS
  Administered 2016-10-23: 2 [IU] via SUBCUTANEOUS
  Administered 2016-10-23 (×2): 3 [IU] via SUBCUTANEOUS
  Administered 2016-10-23 (×2): 2 [IU] via SUBCUTANEOUS
  Administered 2016-10-24 (×2): 3 [IU] via SUBCUTANEOUS
  Administered 2016-10-24: 2 [IU] via SUBCUTANEOUS
  Administered 2016-10-24 – 2016-10-25 (×2): 3 [IU] via SUBCUTANEOUS
  Administered 2016-10-25: 2 [IU] via SUBCUTANEOUS
  Administered 2016-10-25: 3 [IU] via SUBCUTANEOUS
  Administered 2016-10-26: 2 [IU] via SUBCUTANEOUS
  Administered 2016-10-26 (×4): 3 [IU] via SUBCUTANEOUS
  Administered 2016-10-26 – 2016-10-27 (×3): 2 [IU] via SUBCUTANEOUS
  Administered 2016-10-27: 3 [IU] via SUBCUTANEOUS
  Administered 2016-10-27 – 2016-10-29 (×8): 2 [IU] via SUBCUTANEOUS
  Administered 2016-10-30: 3 [IU] via SUBCUTANEOUS
  Administered 2016-10-30 – 2016-10-31 (×6): 2 [IU] via SUBCUTANEOUS
  Administered 2016-10-31: 3 [IU] via SUBCUTANEOUS
  Administered 2016-11-01: 2 [IU] via SUBCUTANEOUS
  Filled 2016-10-16: qty 3
  Filled 2016-10-16: qty 2
  Filled 2016-10-16: qty 3
  Filled 2016-10-16: qty 2
  Filled 2016-10-16 (×2): qty 3
  Filled 2016-10-16 (×2): qty 2
  Filled 2016-10-16: qty 3
  Filled 2016-10-16 (×3): qty 2
  Filled 2016-10-16: qty 3
  Filled 2016-10-16 (×5): qty 2
  Filled 2016-10-16: qty 3
  Filled 2016-10-16: qty 2
  Filled 2016-10-16: qty 3
  Filled 2016-10-16: qty 2
  Filled 2016-10-16 (×2): qty 3
  Filled 2016-10-16 (×4): qty 2
  Filled 2016-10-16: qty 3
  Filled 2016-10-16: qty 2
  Filled 2016-10-16: qty 3
  Filled 2016-10-16 (×2): qty 2
  Filled 2016-10-16: qty 3
  Filled 2016-10-16 (×2): qty 2
  Filled 2016-10-16: qty 3
  Filled 2016-10-16: qty 2
  Filled 2016-10-16: qty 3
  Filled 2016-10-16 (×3): qty 2
  Filled 2016-10-16: qty 3
  Filled 2016-10-16 (×2): qty 2
  Filled 2016-10-16: qty 3
  Filled 2016-10-16 (×2): qty 2
  Filled 2016-10-16 (×5): qty 3
  Filled 2016-10-16: qty 2
  Filled 2016-10-16: qty 3
  Filled 2016-10-16 (×3): qty 2
  Filled 2016-10-16: qty 3
  Filled 2016-10-16: qty 5
  Filled 2016-10-16 (×4): qty 2
  Filled 2016-10-16: qty 3
  Filled 2016-10-16: qty 2
  Filled 2016-10-16: qty 1
  Filled 2016-10-16 (×3): qty 2
  Filled 2016-10-16: qty 3

## 2016-10-16 MED ORDER — FENTANYL CITRATE (PF) 100 MCG/2ML IJ SOLN
100.0000 ug | Freq: Once | INTRAMUSCULAR | Status: AC
  Administered 2016-10-16: 100 ug via INTRAVENOUS

## 2016-10-16 MED ORDER — PRO-STAT SUGAR FREE PO LIQD
30.0000 mL | Freq: Two times a day (BID) | ORAL | Status: DC
  Administered 2016-10-16 – 2016-10-17 (×2): 30 mL

## 2016-10-16 MED ORDER — CHLORHEXIDINE GLUCONATE 0.12% ORAL RINSE (MEDLINE KIT)
15.0000 mL | Freq: Two times a day (BID) | OROMUCOSAL | Status: DC
  Administered 2016-10-16 – 2016-11-08 (×48): 15 mL via OROMUCOSAL

## 2016-10-16 MED ORDER — MIDAZOLAM HCL 2 MG/2ML IJ SOLN
INTRAMUSCULAR | Status: AC
  Filled 2016-10-16: qty 2

## 2016-10-16 MED ORDER — VECURONIUM BROMIDE 10 MG IV SOLR
INTRAVENOUS | Status: AC
  Filled 2016-10-16: qty 10

## 2016-10-16 MED ORDER — FENTANYL CITRATE (PF) 100 MCG/2ML IJ SOLN
INTRAMUSCULAR | Status: AC
  Filled 2016-10-16: qty 2

## 2016-10-16 MED ORDER — DIAZEPAM 5 MG/ML IJ SOLN: 5.0000 mg | Freq: Once | INTRAMUSCULAR | Status: DC

## 2016-10-16 MED ORDER — METHYLPREDNISOLONE SODIUM SUCC 125 MG IJ SOLR
60.0000 mg | Freq: Two times a day (BID) | INTRAMUSCULAR | Status: DC
  Administered 2016-10-16 – 2016-10-17 (×2): 60 mg via INTRAVENOUS
  Filled 2016-10-16 (×2): qty 2

## 2016-10-16 MED ORDER — MIDAZOLAM HCL 2 MG/2ML IJ SOLN: 2.0000 mg | INTRAMUSCULAR | Status: DC | PRN

## 2016-10-16 MED ORDER — CARVEDILOL 3.125 MG PO TABS
3.1250 mg | ORAL_TABLET | Freq: Two times a day (BID) | ORAL | Status: DC
  Administered 2016-10-16: 3.125 mg via ORAL
  Filled 2016-10-16: qty 1

## 2016-10-16 MED ORDER — LABETALOL HCL 5 MG/ML IV SOLN
10.0000 mg | INTRAVENOUS | Status: DC | PRN
  Administered 2016-10-16: 10 mg via INTRAVENOUS
  Filled 2016-10-16 (×2): qty 4

## 2016-10-16 MED ORDER — MIDAZOLAM HCL 2 MG/2ML IJ SOLN
2.0000 mg | INTRAMUSCULAR | Status: DC | PRN
  Administered 2016-10-16: 2 mg via INTRAVENOUS
  Filled 2016-10-16 (×2): qty 2

## 2016-10-16 MED ORDER — LORAZEPAM 2 MG/ML IJ SOLN
0.5000 mg | Freq: Once | INTRAMUSCULAR | Status: AC
  Administered 2016-10-16: 0.5 mg via INTRAVENOUS
  Filled 2016-10-16: qty 1

## 2016-10-16 MED ORDER — ORAL CARE MOUTH RINSE
15.0000 mL | OROMUCOSAL | Status: DC
  Administered 2016-10-16 – 2016-11-09 (×221): 15 mL via OROMUCOSAL

## 2016-10-16 MED ORDER — CHLORHEXIDINE GLUCONATE 0.12% ORAL RINSE (MEDLINE KIT): 15.0000 mL | Freq: Two times a day (BID) | OROMUCOSAL | Status: DC

## 2016-10-16 MED ORDER — FENTANYL 2500MCG IN NS 250ML (10MCG/ML) PREMIX INFUSION
25.0000 ug/h | INTRAVENOUS | Status: DC
  Administered 2016-10-16: 100 ug/h via INTRAVENOUS
  Administered 2016-10-17: 150 ug/h via INTRAVENOUS
  Administered 2016-10-17: 200 ug/h via INTRAVENOUS
  Filled 2016-10-16 (×4): qty 250

## 2016-10-16 MED ORDER — SENNOSIDES 8.8 MG/5ML PO SYRP
5.0000 mL | ORAL_SOLUTION | Freq: Two times a day (BID) | ORAL | Status: DC | PRN
  Administered 2016-10-20 – 2016-10-21 (×2): 5 mL
  Filled 2016-10-16 (×2): qty 5

## 2016-10-16 MED ORDER — FAMOTIDINE IN NACL 20-0.9 MG/50ML-% IV SOLN
20.0000 mg | INTRAVENOUS | Status: DC
  Administered 2016-10-16 – 2016-10-17 (×2): 20 mg via INTRAVENOUS
  Filled 2016-10-16 (×2): qty 50

## 2016-10-16 MED ORDER — MORPHINE SULFATE (PF) 2 MG/ML IV SOLN
2.0000 mg | INTRAVENOUS | Status: DC | PRN
  Administered 2016-10-16 – 2016-10-17 (×4): 2 mg via INTRAVENOUS
  Filled 2016-10-16 (×5): qty 1

## 2016-10-16 MED ORDER — IPRATROPIUM-ALBUTEROL 0.5-2.5 (3) MG/3ML IN SOLN
3.0000 mL | RESPIRATORY_TRACT | Status: DC | PRN
  Administered 2016-10-16 – 2016-10-18 (×10): 3 mL via RESPIRATORY_TRACT
  Filled 2016-10-16 (×10): qty 3

## 2016-10-16 MED ORDER — NITROGLYCERIN 2 % TD OINT
0.5000 [in_us] | TOPICAL_OINTMENT | Freq: Once | TRANSDERMAL | Status: AC
  Administered 2016-10-16: 0.5 [in_us] via TOPICAL
  Filled 2016-10-16: qty 0.5

## 2016-10-16 MED ORDER — LABETALOL HCL 5 MG/ML IV SOLN: 10.0000 mg | INTRAVENOUS | Status: DC | PRN

## 2016-10-16 MED ORDER — FENTANYL CITRATE (PF) 100 MCG/2ML IJ SOLN: 50.0000 ug | Freq: Once | INTRAMUSCULAR | Status: AC

## 2016-10-16 MED ORDER — VECURONIUM BROMIDE 10 MG IV SOLR
10.0000 mg | Freq: Once | INTRAVENOUS | Status: AC
  Administered 2016-10-16: 10 mg via INTRAVENOUS

## 2016-10-16 MED ORDER — LEVOFLOXACIN 500 MG PO TABS: 250.0000 mg | ORAL_TABLET | Freq: Every day | ORAL | Status: DC

## 2016-10-16 MED ORDER — METHYLPREDNISOLONE SODIUM SUCC 125 MG IJ SOLR
125.0000 mg | Freq: Once | INTRAMUSCULAR | Status: AC
  Administered 2016-10-16: 125 mg via INTRAVENOUS
  Filled 2016-10-16: qty 2

## 2016-10-16 NOTE — Progress Notes (Signed)
Patient WOB increased while on bipap, patient responsive, but lethargic. Dr. Belia HemanKasa came to assess patient and patient was intubated. Procedure went well. Patient also had emergent CVC placed for limited venous access and vasoactive medications. Patient's family updated by Dr. Belia HemanKasa. Foley and OG placed per protocol. Currently patient stable, will continue to monitor.  [name]

## 2016-10-16 NOTE — Progress Notes (Signed)
Patient still complaining of SOB and difficulty breathing. MD notified. Orders pending.

## 2016-10-16 NOTE — Procedures (Signed)
Central Venous Catheter Placement: Indication: Patient receiving vesicant or irritant drug.; Patient receiving intravenous therapy for longer than 5 days.; Patient has limited or no vascular access.   Consent:emergent  Risks and benefits explained in detail including risk of infection, bleeding, respiratory failure and death..   Hand washing performed prior to starting the procedure.   Procedure: An active timeout was performed and correct patient, name, & ID confirmed.  After explaining risk and benefits, patient was positioned correctly for central venous access. Patient was prepped using strict sterile technique including chlorohexadine preps, sterile drape, sterile gown and sterile gloves.  The area was prepped, draped and anesthetized in the usual sterile manner. Patient comfort was obtained.  A triple lumen catheter was placed in LEFT Internal Jugular Vein There was good blood return, catheter caps were placed on lumens, catheter flushed easily, the line was secured and a sterile dressing and BIO-PATCH applied.   Ultrasound was used to visualize vasculature and guidance of needle.   Number of Attempts: 1 Complications:none Estimated Blood Loss: none Chest Radiograph indicated and ordered.  Operator: Meena Barrantes.   Kevin Holden [name], M.D.  [facility]  Medical Director ICU-[facility] [place] Medical Director [facility] Cardio-Pulmonary Department     

## 2016-10-16 NOTE — Progress Notes (Signed)
eLink Physician-Brief Progress Note Patient Name: Kevin BurrowJoseph Mclamb DOB: 07-24-1950 MRN: [id number]   Date of Service  10/16/2016  HPI/Events of Note  Asked to review KUB for NGT placement. NGT tip in proximal stomach. Can't see side port well, however, suspect that it is near GE junction.  eICU Interventions  Advance NGT 5 cm and repeat KUB film for placement verification.      Intervention Category Intermediate Interventions: Diagnostic test evaluation  Lenell AntuSommer,Navina Wohlers [place] 10/16/2016, 3:38 PM

## 2016-10-16 NOTE — Progress Notes (Signed)
Patient denies any improvement after giving lasix and placing nitro patch. MD notified. Will come to see patient.

## 2016-10-16 NOTE — Progress Notes (Signed)
Patient asked for "something to calm me down". Patient has received a total of 4 mg of morphine since 0630 without any relief. Dr. Belia HemanKasa notified and gave verbal order for 5 mg of IV valium x1. Order placed. [name]

## 2016-10-16 NOTE — Progress Notes (Signed)
Pt arrived to unit for bipap due to COPD exacerbation, pt AAO x 4 denies pain. Further assessment via flowsheet

## 2016-10-16 NOTE — Procedures (Signed)
Endotracheal Intubation: Patient required placement of an artificial airway secondary to resp failure.   Consent: Emergent.   Hand washing performed prior to starting the procedure.   Medications administered for sedation prior to procedure: Midazolam 4 mg IV,  Vecuronium 10 mg IV, Fentanyl 100 mcg IV.   Procedure: A time out procedure was called and correct patient, name, & ID confirmed. Needed supplies and equipment were assembled and checked to include ETT, 10 ml syringe, Glidescope, Mac and Miller blades, suction, oxygen and bag mask valve, end tidal CO2 monitor. Patient was positioned to align the mouth and pharynx to facilitate visualization of the glottis.  Heart rate, SpO2 and blood pressure was continuously monitored during the procedure. Pre-oxygenation was conducted prior to intubation and endotracheal tube was placed through the vocal cords into the trachea.  During intubation an assistant applied gentle pressure to the cricoid cartilage.   The artificial airway was placed under direct visualization via glidescope route using a 8.0 ETT on the first attempt.    ETT was secured at 23 cm mark.    Placement was confirmed by auscuitation of lungs with good breath sounds bilaterally and no stomach sounds.  Condensation was noted on endotracheal tube.  Pulse ox %.  CO2 detector in place with appropriate color change.   Complications: None .   Operator: Gertrude Bucks.   Chest radiograph ordered and pending.   Comments: OGT placed via glidescope.  Corrin Parker, M.D.  [facility]  Medical Director [place] Director [facility] Cardio-Pulmonary Department

## 2016-10-16 NOTE — Consult Note (Signed)
PULMONARY / CRITICAL CARE MEDICINE   Name: Kevin BurrowJoseph Holden MRN: [id number] DOB: 1950/06/03    ADMISSION DATE:  10/15/2016   CONSULTATION DATE:  10/16/2016  REFERRING MD:  Dr. Sheryle Hailiamond  REASON: Acute respiratory failure  CHIEF COMPLAINT:  Worsening dyspnea  HISTORY OF PRESENT ILLNESS:  66 y/o [name] who presented on 5/18 with worsening dyspnea after completing a course of prednisone and azithromycin and lower extremity swelling. Dyspnea associated with a non-productive cough,  fever/chills. He was admitted with acute COPD exacerbation. He was transferred to the ICU for worsening dyspnea and placed on continuous. He received a dose of lasix and a Duoneb. Reports persistent dyspnea. Denies chest pain, palpitations, nausea and vomiting. He continues to smoke.  Patient was recently discharged on 10/05/16 after hospitalization with similar symptoms.  PAST MEDICAL HISTORY :  He  has a past medical history of Asthma; COPD (chronic obstructive pulmonary disease) (HCC); HTN (hypertension); and Pneumonia.  PAST SURGICAL HISTORY: He  has a past surgical history that includes Abdominal surgery; Vascular surgery; Cardiac catheterization (Right, 05/27/2016); Cardiac catheterization (N/A, 05/27/2016); Cardiac catheterization (05/27/2016); and Back surgery.  Allergies  Allergen Reactions  . Aspirin Other (See Comments)    Unknown  . Celecoxib Other (See Comments)    Reaction: Unknown  . Nsaids Other (See Comments)    Reaction: Unknown    No current facility-administered medications on file prior to encounter.    Current Outpatient Prescriptions on File Prior to Encounter  Medication Sig  . albuterol (PROVENTIL HFA;VENTOLIN HFA) 108 (90 Base) MCG/ACT inhaler Inhale 1-2 puffs into the lungs every 6 (six) hours as needed for wheezing or shortness of breath.  . cholecalciferol (VITAMIN D) 1000 units tablet Take 1,000 Units by mouth daily.  . Ferrous Gluconate (IRON 27 PO) Take 1 tablet by mouth daily.   Marland Kitchen. gabapentin (NEURONTIN) 300 MG capsule Take 900 mg by mouth 3 (three) times daily.   Marland Kitchen. loratadine (CLARITIN) 10 MG tablet Take 10 mg by mouth daily.   . methocarbamol (ROBAXIN) 500 MG tablet Take 500 mg by mouth every 6 (six) hours as needed for muscle spasms.  Marland Kitchen. omeprazole (PRILOSEC) 20 MG capsule Take 20 mg by mouth daily.    FAMILY HISTORY:  His has no family status information on file.    SOCIAL HISTORY: He  reports that he has been smoking Cigarettes.  He has a 40.00 pack-year smoking history. He has never used smokeless tobacco. He reports that he does not drink alcohol or use drugs.  REVIEW OF SYSTEMS:   Limited as patient is on BiPAP Constitutional: Negative for fever and chills.  HENT: Negative for congestion and rhinorrhea.  Eyes: Negative for redness and visual disturbance.  Respiratory: Positive for shortness of breath, non-productive cough and wheezing.  Cardiovascular: Negative for chest pain and palpitations.  Gastrointestinal: Negative  for nausea , vomiting and abdominal pain and loose stools Genitourinary: Negative for dysuria and urgency.  Endocrine: Denies polyuria, polyphagia and heat intolerance Musculoskeletal: reports generalized weakness  Skin: Negative for pallor and wound.  Neurological: Negative for dizziness and headaches   SUBJECTIVE:   VITAL SIGNS: BP (!) 169/80 (BP Location: Left Arm)   Pulse 100   Temp 97.7 F (36.5 C) (Axillary)   Resp 12   Ht 6' (1.829 m)   Wt 120 lb 5.9 oz (54.6 kg)   SpO2 93%   BMI 16.33 kg/m   HEMODYNAMICS:    VENTILATOR SETTINGS: FiO2 (%):  [30 %-36 %] 30 %  INTAKE /  OUTPUT: I/O last 3 completed shifts: In: 720 [P.O.:720] Out: 400 [Urine:400]  PHYSICAL EXAMINATION: General:  cachetic Neuro: AAO X3, no focal deficits HEENT: PERRLA, no JVD Cardiovascular: tachycardic, regular, S1/S2, no MRG, +2 pulses bilaterally, +1 edema Lungs: Increased WOB, +inspiratory and expiratory wheezes, no rhonchi or  crackles Abdomen:  Non-distended, normal bowel sounds Musculoskeletal: no joint swelling Skin: warm and dry  LABS:  BMET  Recent Labs Lab 10/15/16 0433 10/16/16 0422  NA 138 141  K 4.1 5.2*  CL 102 98*  CO2 26 33*  BUN 16 24*  CREATININE 1.11 1.33*  GLUCOSE 143* 140*    Electrolytes  Recent Labs Lab 10/15/16 0433 10/16/16 0422  CALCIUM 8.6* 9.1    CBC  Recent Labs Lab 10/15/16 0433 10/16/16 0503  WBC 6.4 17.1*  HGB 15.5 15.6  HCT 45.9 46.7  PLT 194 212    Coag's No results for input(s): APTT, INR in the last 168 hours.  Sepsis Markers No results for input(s): LATICACIDVEN, PROCALCITON, O2SATVEN in the last 168 hours.  ABG  Recent Labs Lab 10/16/16 0442  PHART 7.28*  PCO2ART 67*  PO2ART 76*    Liver Enzymes  Recent Labs Lab 10/15/16 0433 10/16/16 0422  AST 26 32  ALT 14* 16*  ALKPHOS 75 83  BILITOT 0.5 0.5  ALBUMIN 3.7 4.0    Cardiac Enzymes  Recent Labs Lab 10/15/16 0433  TROPONINI <0.03    Glucose  Recent Labs Lab 10/16/16 0458  GLUCAP 124*    Imaging Dg Chest Port 1 View  Result Date: 10/16/2016 CLINICAL DATA:  Increased shortness of breath.  Cough. EXAM: PORTABLE CHEST 1 VIEW COMPARISON:  Radiographs yesterday. FINDINGS: The cardiomediastinal contours are normal. Stable hyperinflation. Pulmonary vasculature is normal. No consolidation, pleural effusion, or pneumothorax. No acute osseous abnormalities are seen. IMPRESSION: Stable hyperinflation. No acute abnormality or change from prior exam. Electronically Signed   By: Rubye Oaks M.D.   On: 10/16/2016 02:29    STUDIES:  Echo results pending  CULTURES:  none  ANTIBIOTICS: Levaquin>  SIGNIFICANT EVENTS: 05/08>discharged 5/18>re-admitted with Acute respiratory failure  LINES/TUBES: PIVs  DISCUSSION: 66 Y/O [name] presenting with recurrent respiratory failure, acute COPD exacerbation and Acute renal failure.  ASSESSMENT Acute COPD  exacerbation Acute hypercarbic respiratory failure Leukocytosis Uncontrolled hypertension H/O T2DM H/O CHF  PLAN Continuous BiPAP as tolerated Solu-medrol Nebulized steroids and bronchodilators ABG at 10 am Start coreg for HTN and CHF Hold oral diuretics Trend creatinine Glucose monitor ing with SSI coverage Monitor and replace electrolytes   FAMILY  - Updates:   - Inter-disciplinary family meet or Palliative Care meeting due by:  day 7  Magdalene S. [facility] ANP-BC Pulmonary and Critical Care Medicine [facility] Pager [phone] or [phone] 10/16/2016, 6:20 AM

## 2016-10-16 NOTE — Progress Notes (Signed)
Per Dr. Arsenio LoaderSommer with [facility], OG okay to use. Tube feedings started. Patient remains on levophed at 7 mcg. Will titrate as tolerated. Patient had jerking "seizure like" activity that lasted approximately 5-10 seconds when xray came to complete abd xray. Patient was agitated and not tolerating ventilator during this, versed given. Dr. Arsenio LoaderSommer notified of this as well. Per order monitor patient for any further activity like this, but more than likely d/t agitation. No other episodes, patient followed commands during evening assessment. [name]

## 2016-10-16 NOTE — Progress Notes (Signed)
MD order to transfer patient to CCU

## 2016-10-16 NOTE — Progress Notes (Signed)
Patient complaining of increased SOB, respiratory called breathing treatment. Patient breathing unrelieved by treatment. MD notified duonebs ordered

## 2016-10-16 NOTE — Progress Notes (Signed)
eLink Physician-Brief Progress Note Patient Name: Tilda BurrowJoseph Rzasa DOB: 18-Feb-1951 MRN: [id number]   Date of Service  10/16/2016  HPI/Events of Note  [age] M admitted early yesterday with AoCRF.  Stable enough for floor but transferred to SDU following c/o of continued SOB not responding to lasix.  On camera check patient is on BiPAP.  He is HD stable being treated for COPD exacerbation nebs, steroids, ABX  eICU Interventions  Plan of care per primary team PCCM extender to see patient tonight [facility]     Intervention Category Evaluation Type: New Patient Evaluation  DETERDING,ELIZABETH 10/16/2016, 4:52 AM

## 2016-10-16 NOTE — Progress Notes (Signed)
ipap incr to 12  epap incr to 5

## 2016-10-16 NOTE — Progress Notes (Signed)
[facility] [place] [facility]   PATIENT NAME: Kevin BurrowJoseph Holden    MR#:  [id number]  DATE OF BIRTH:  1951-01-06  SUBJECTIVE:  CHIEF COMPLAINT:   Chief Complaint  Patient presents with  . Shortness of Breath  The patient is 66 year old American male with medical history significant for ongoing tobacco abuse, COPD, hypertension, who presented to the hospital with complaints of shortness of breath. He admits of cough and green phlegm production. He also admits of lower extremity swelling, wheezing. Chest x-ray revealed hyperinflation, COPD, no infiltrates or CHF were noted. The patient was initiated on steroids, inhalation therapy, oxygen, Lasix .   Transfer to ICU last night because of persistent respiratory distress associated with hypercapnic respiratory failure, patient is started on BiPAP. He says he is feeling slightly better.  Review of Systems  Constitutional: Negative for chills, fever and weight loss.  HENT: Negative for congestion.   Eyes: Negative for blurred vision and double vision.  Respiratory: Positive for cough, sputum production, shortness of breath and wheezing.   Cardiovascular: Positive for leg swelling. Negative for chest pain, palpitations, orthopnea and PND.  Gastrointestinal: Negative for abdominal pain, blood in stool, constipation, diarrhea, nausea and vomiting.  Genitourinary: Negative for dysuria, frequency, hematuria and urgency.  Musculoskeletal: Negative for falls.  Neurological: Negative for dizziness, tremors, focal weakness and headaches.  Endo/Heme/Allergies: Does not bruise/bleed easily.  Psychiatric/Behavioral: Negative for depression. The patient does not have insomnia.     VITAL SIGNS: Blood pressure (!) 148/100, pulse (!) 104, temperature 97.6 F (36.4 C), temperature source Axillary, resp. rate (!) 21, height 6' (1.829 m), weight 54.6 kg (120 lb 5.9 oz), SpO2 94 %.  PHYSICAL EXAMINATION:   GENERAL:  66  y.o.-year-old patient lying in the bein mild-to-moderate respiratory distress seen in ICU, has BiPAP facemask. Eyes: Pupils equal, round, reactive to light and accommodation. No scleral icterus. Extraocular muscles intact.  HEENT: Head atraumatic, normocephalic. Oropharynx and nasopharynx clear.  NECK:  Supple, no jugular venous distention. No thyroid enlargement, no tenderness.  Lungs: Patient has bilateral expiratory wheeze in all lung fields, no crackles. Not using acetaminophen of respiration. Patient has BiPAP machine.   CARDIOVASCULAR: S1, S2 normal. No murmurs, rubs, or gallops.  ABDOMEN: Soft, nontender, nondistended. Bowel sounds present. No organomegaly or mass.  EXTREMITIES: . Trace lower extremity and pedal edema, No cyanosis, or clubbing.  NEUROLOGIC: Cranial nerves II through XII are intact. Muscle strength 5/5 in all extremities. Sensation intact. Gait not checked.  PSYCHIATRIC: The patient is alert and oriented x 3.  SKIN: No obvious rash, lesion, or ulcer.   ORDERS/RESULTS REVIEWED:   CBC  Recent Labs Lab 10/15/16 0433 10/16/16 0503  WBC 6.4 17.1*  HGB 15.5 15.6  HCT 45.9 46.7  PLT 194 212  MCV 83.1 82.8  MCH 28.0 27.6  MCHC 33.7 33.4  RDW 15.3* 15.3*  LYMPHSABS 1.4  --   MONOABS 0.6  --   EOSABS 0.0  --   BASOSABS 0.0  --    ------------------------------------------------------------------------------------------------------------------  Chemistries   Recent Labs Lab 10/15/16 0433 10/16/16 0422  NA 138 141  K 4.1 5.2*  CL 102 98*  CO2 26 33*  GLUCOSE 143* 140*  BUN 16 24*  CREATININE 1.11 1.33*  CALCIUM 8.6* 9.1  AST 26 32  ALT 14* 16*  ALKPHOS 75 83  BILITOT 0.5 0.5   ------------------------------------------------------------------------------------------------------------------ estimated creatinine clearance is 42.8 mL/min (A) (by C-G formula based on SCr of 1.33 mg/dL  (  H)). ------------------------------------------------------------------------------------------------------------------  Recent Labs  10/15/16 0433  TSH 1.609    Cardiac Enzymes  Recent Labs Lab 10/15/16 0433  TROPONINI <0.03   ------------------------------------------------------------------------------------------------------------------ Invalid input(s): POCBNP ---------------------------------------------------------------------------------------------------------------  RADIOLOGY: Dg Chest 2 View  Result Date: 10/15/2016 CLINICAL DATA:  Shortness of breath EXAM: CHEST  2 VIEW COMPARISON:  Chest radiograph 10/04/2016 FINDINGS: The lungs are hyperexpanded. There are prominent pulmonary markings, unchanged. No consolidation or pulmonary edema. No pneumothorax or pleural effusion. IMPRESSION: Hyperinflation, consistent with COPD.  No focal consolidation. Electronically Signed   By: Deatra Robinson M.D.   On: 10/15/2016 05:05   Dg Chest Port 1 View  Result Date: 10/16/2016 CLINICAL DATA:  Increased shortness of breath.  Cough. EXAM: PORTABLE CHEST 1 VIEW COMPARISON:  Radiographs yesterday. FINDINGS: The cardiomediastinal contours are normal. Stable hyperinflation. Pulmonary vasculature is normal. No consolidation, pleural effusion, or pneumothorax. No acute osseous abnormalities are seen. IMPRESSION: Stable hyperinflation. No acute abnormality or change from prior exam. Electronically Signed   By: Rubye Oaks M.D.   On: 10/16/2016 02:29    EKG:  Orders placed or performed during the hospital encounter of 10/15/16  . ED EKG  . ED EKG  . EKG 12-Lead  . EKG 12-Lead    ASSESSMENT AND PLAN:  Active Problems:   Acute on chronic respiratory failure with hypoxemia (HCC)   Protein-calorie malnutrition, severe  #1. Acute on chronic respiratory failure with hypoxia due to COPD exacerbation,Has acute hypercapnic respiratory failure with respiratory acidosis: Transferred to ICU  because of this, started on BiPAP since last night, still has lots of wheezing. Continue steroids, nebulizers, appreciate insulin intensivist following the patient.   #2. COPD exacerbation due to acute bronchitis, initiate patient on levofloxacin, Pulmicort, DuoNeb's, prednisone, Spiriva, follow clinically   #3. Acute bronchitis, initiate levofloxacin, get sputum cultures if possible  #4. Lower extremity swelling, likely due to right-sided heart failure, ; continue Lasix.   #5. Tobacco abuse. Counseling, discussed with patient for 4 minutes, nicotine replacement therapy will be initiated, he is agreeable    Management plans discussed with the patient, family and they are in agreement.   DRUG ALLERGIES:  Allergies  Allergen Reactions  . Aspirin Other (See Comments)    Unknown  . Celecoxib Other (See Comments)    Reaction: Unknown  . Nsaids Other (See Comments)    Reaction: Unknown    CODE STATUS:     Code Status Orders        Start     Ordered   10/15/16 0823  Full code  Continuous     10/15/16 0822    Code Status History    Date Active Date Inactive Code Status Order ID Comments User Context   10/04/2016  4:43 AM 10/05/2016  2:59 PM Full Code [id number]  Arnaldo Natal, MD Inpatient   05/27/2016  9:28 AM 05/27/2016  2:35 PM Full Code [id number]  Annice Needy, MD Inpatient   11/10/2011  9:36 AM 11/11/2011  4:58 PM Full Code [id number]  Christiane Ha, MD Inpatient      TOTAL TIME TAKING CARE OF THIS PATIENT:40 minutes.    Katha Hamming M.D on 10/16/2016 at 9:14 AM  Between 7am to 6pm - Pager - [phone]  After 6pm go to [email or web address] - password [email or web address] [facility]  [place]  [facility]  Office  [phone]  CC: Primary care physician; Leanna Sato, MD

## 2016-10-16 NOTE — Progress Notes (Signed)
Patient continues of complaining of SOB, MD notified orders pending.

## 2016-10-17 ENCOUNTER — Inpatient Hospital Stay: Payer: Medicare Other

## 2016-10-17 LAB — PHOSPHORUS
Phosphorus: 5.4 mg/dL — ABNORMAL HIGH (ref 2.5–4.6)
Phosphorus: 6.2 mg/dL — ABNORMAL HIGH (ref 2.5–4.6)

## 2016-10-17 LAB — BASIC METABOLIC PANEL
ANION GAP: 10 (ref 5–15)
BUN: 59 mg/dL — ABNORMAL HIGH (ref 6–20)
CALCIUM: 8 mg/dL — AB (ref 8.9–10.3)
CO2: 28 mmol/L (ref 22–32)
Chloride: 99 mmol/L — ABNORMAL LOW (ref 101–111)
Creatinine, Ser: 2.28 mg/dL — ABNORMAL HIGH (ref 0.61–1.24)
GFR, EST AFRICAN AMERICAN: 33 mL/min — AB (ref >60)
GFR, EST NON AFRICAN AMERICAN: 28 mL/min — AB (ref >60)
GLUCOSE: 137 mg/dL — AB (ref 65–99)
POTASSIUM: 5.4 mmol/L — AB (ref 3.5–5.1)
Sodium: 137 mmol/L (ref 135–145)

## 2016-10-17 LAB — GLUCOSE, CAPILLARY
Glucose-Capillary: 114 mg/dL — ABNORMAL HIGH (ref 65–99)
Glucose-Capillary: 130 mg/dL — ABNORMAL HIGH (ref 65–99)
Glucose-Capillary: 135 mg/dL — ABNORMAL HIGH (ref 65–99)
Glucose-Capillary: 155 mg/dL — ABNORMAL HIGH (ref 65–99)
Glucose-Capillary: 158 mg/dL — ABNORMAL HIGH (ref 65–99)
Glucose-Capillary: 172 mg/dL — ABNORMAL HIGH (ref 65–99)

## 2016-10-17 LAB — URINALYSIS, COMPLETE (UACMP) WITH MICROSCOPIC
Bacteria, UA: NONE SEEN
Bilirubin Urine: NEGATIVE
Glucose, UA: NEGATIVE mg/dL
Ketones, ur: NEGATIVE mg/dL
Nitrite: NEGATIVE
PH: 5 (ref 5.0–8.0)
Protein, ur: 100 mg/dL — AB
SPECIFIC GRAVITY, URINE: 1.016 (ref 1.005–1.030)

## 2016-10-17 LAB — BLOOD GAS, ARTERIAL
Acid-Base Excess: 1.1 mmol/L (ref 0.0–2.0)
Bicarbonate: 29.3 mmol/L — ABNORMAL HIGH (ref 20.0–28.0)
FIO2: 0.4
MECHVT: 450 mL
O2 Saturation: 96.9 %
PEEP: 5 cmH2O
Patient temperature: 37
RATE: 18 {breaths}/min
pCO2 arterial: 61 mmHg — ABNORMAL HIGH (ref 32.0–48.0)
pH, Arterial: 7.29 — ABNORMAL LOW (ref 7.350–7.450)
pO2, Arterial: 99 mmHg (ref 83.0–108.0)

## 2016-10-17 LAB — PROTEIN / CREATININE RATIO, URINE
CREATININE, URINE: 127 mg/dL
Protein Creatinine Ratio: 0.68 mg/mg{Cre} — ABNORMAL HIGH (ref 0.00–0.15)
TOTAL PROTEIN, URINE: 86 mg/dL

## 2016-10-17 LAB — MAGNESIUM
MAGNESIUM: 2.6 mg/dL — AB (ref 1.7–2.4)
Magnesium: 2.4 mg/dL (ref 1.7–2.4)

## 2016-10-17 MED ORDER — ADULT MULTIVITAMIN LIQUID CH
15.0000 mL | Freq: Every day | ORAL | Status: DC
  Administered 2016-10-17 – 2016-11-09 (×22): 15 mL
  Filled 2016-10-17 (×24): qty 15

## 2016-10-17 MED ORDER — VITAL 1.5 CAL PO LIQD
1000.0000 mL | ORAL | Status: DC
  Administered 2016-10-17 (×2): 1000 mL

## 2016-10-17 MED ORDER — DOCUSATE SODIUM 50 MG/5ML PO LIQD
100.0000 mg | Freq: Two times a day (BID) | ORAL | Status: DC
  Administered 2016-10-17 – 2016-11-08 (×37): 100 mg
  Filled 2016-10-17 (×39): qty 10

## 2016-10-17 MED ORDER — MIDAZOLAM HCL 2 MG/2ML IJ SOLN
2.0000 mg | INTRAMUSCULAR | Status: DC | PRN
  Administered 2016-10-17 – 2016-10-18 (×2): 2 mg via INTRAVENOUS
  Administered 2016-10-18: 4 mg via INTRAVENOUS

## 2016-10-17 MED ORDER — PATIROMER SORBITEX CALCIUM 8.4 G PO PACK
8.4000 g | PACK | Freq: Every day | ORAL | Status: DC
  Administered 2016-10-17 – 2016-10-18 (×2): 8.4 g via ORAL
  Filled 2016-10-17: qty 4
  Filled 2016-10-17: qty 1

## 2016-10-17 MED ORDER — ENOXAPARIN SODIUM 30 MG/0.3ML ~~LOC~~ SOLN
30.0000 mg | SUBCUTANEOUS | Status: DC
  Administered 2016-10-17 – 2016-10-18 (×2): 30 mg via SUBCUTANEOUS
  Filled 2016-10-17 (×2): qty 0.3

## 2016-10-17 MED ORDER — MIDAZOLAM HCL 2 MG/2ML IJ SOLN
2.0000 mg | INTRAMUSCULAR | Status: DC | PRN
  Filled 2016-10-17: qty 2
  Filled 2016-10-17: qty 4

## 2016-10-17 MED ORDER — SODIUM CHLORIDE 0.9 % IV BOLUS (SEPSIS)
1000.0000 mL | Freq: Once | INTRAVENOUS | Status: AC
  Administered 2016-10-17: 1000 mL via INTRAVENOUS

## 2016-10-17 MED ORDER — METHYLPREDNISOLONE SODIUM SUCC 40 MG IJ SOLR
40.0000 mg | Freq: Two times a day (BID) | INTRAMUSCULAR | Status: DC
  Administered 2016-10-17 – 2016-10-21 (×8): 40 mg via INTRAVENOUS
  Filled 2016-10-17 (×8): qty 1

## 2016-10-17 MED ORDER — FENTANYL BOLUS VIA INFUSION
50.0000 ug | INTRAVENOUS | Status: DC | PRN
  Administered 2016-10-17 – 2016-10-18 (×3): 50 ug via INTRAVENOUS
  Administered 2016-10-18 – 2016-10-19 (×2): 100 ug via INTRAVENOUS
  Filled 2016-10-17: qty 100

## 2016-10-17 MED ORDER — BUDESONIDE 0.5 MG/2ML IN SUSP
0.5000 mg | Freq: Two times a day (BID) | RESPIRATORY_TRACT | Status: DC
  Administered 2016-10-17 – 2016-10-18 (×2): 0.5 mg via RESPIRATORY_TRACT
  Filled 2016-10-17 (×2): qty 2

## 2016-10-17 NOTE — Progress Notes (Signed)
Pharmacist-Provider Communication  Enoxaparin changed to 30mg  Q24hr based on patients renal function.   Delsa BernKelly m Azra Abrell, PharmD 9:52 AM 10/17/2016

## 2016-10-17 NOTE — Progress Notes (Signed)
Attempted to wean to 150 mc/min fentanyl, pt became agitated within a few minutes, O2 sats dropped to 80s, pt with jerking movement of hands, appeared to be trying to reach his ETT, sedation titrated back to 200 mcgs.

## 2016-10-17 NOTE — Progress Notes (Signed)
[facility] - [place] at [facility]   PATIENT NAME: Kevin Holden    MR#:  [id number]  DATE OF BIRTH:  [birth year]  SUBJECTIVE: Patient is intubated the afternoon. On full vent support, also started on Levophed because of low blood pressure. Continue to spike fever up to 101 Fahrenheit.   CHIEF COMPLAINT:   Chief Complaint  Patient presents with  . Shortness of Breath  Review of Systems  Unable to perform ROS: Intubated  Constitutional: Negative for chills, fever and weight loss.  HENT: Negative for congestion.   Eyes: Negative for blurred vision and double vision.  Respiratory: Positive for cough, sputum production, shortness of breath and wheezing.   Cardiovascular: Positive for leg swelling. Negative for chest pain, palpitations, orthopnea and PND.  Gastrointestinal: Negative for abdominal pain, blood in stool, constipation, diarrhea, nausea and vomiting.  Genitourinary: Negative for dysuria, frequency, hematuria and urgency.  Musculoskeletal: Negative for falls.  Neurological: Negative for dizziness, tremors, focal weakness and headaches.  Endo/Heme/Allergies: Does not bruise/bleed easily.  Psychiatric/Behavioral: Negative for depression. The patient does not have insomnia.     VITAL SIGNS: Blood pressure 91/72, pulse 97, temperature 99.9 F (37.7 C), temperature source Oral, resp. rate (!) 22, height 6' (1.829 m), weight 52.2 kg (115 lb 1.3 oz), SpO2 98 %.  PHYSICAL EXAMINATION:   GENERAL:  66 y.o.-year-old patient currently intubated, sedated.  Eyes: Pupils equal, round, reactive to light  HEENT: Head atraumatic, normocephalic. Orally intubated. NECK: , no jugular venous distention. No thyroid enlargement, no tenderness.  Lungs: Patient has bilateral expiratory wheeze in all lung fields, no crackles.   CARDIOVASCULAR: S1, S2 normal. No murmurs, rubs, or gallops.  ABDOMEN: Soft, , nondistended. Bowel sounds present. No organomegaly or mass.    EXTREMITIES: . Trace lower extremity and pedal edema, No cyanosis, or clubbing.  NEUROLOGIC: intubated , sedated.    PSYCHIATRIC: Intubated, sedated.  SKIN: No obvious rash, lesion, or ulcer.   ORDERS/RESULTS REVIEWED:   CBC  Recent Labs Lab 10/15/16 0433 10/16/16 0503  WBC 6.4 17.1*  HGB 15.5 15.6  HCT 45.9 46.7  PLT 194 212  MCV 83.1 82.8  MCH 28.0 27.6  MCHC 33.7 33.4  RDW 15.3* 15.3*  LYMPHSABS 1.4  --   MONOABS 0.6  --   EOSABS 0.0  --   BASOSABS 0.0  --    ------------------------------------------------------------------------------------------------------------------  Chemistries   Recent Labs Lab 10/15/16 0433 10/16/16 0422 10/16/16 1801 10/17/16 0450  NA 138 141  --  137  K 4.1 5.2*  --  5.4*  CL 102 98*  --  99*  CO2 26 33*  --  28  GLUCOSE 143* 140*  --  137*  BUN 16 24*  --  59*  CREATININE 1.11 1.33*  --  2.28*  CALCIUM 8.6* 9.1  --  8.0*  MG  --   --  2.5* 2.4  AST 26 32  --   --   ALT 14* 16*  --   --   ALKPHOS 75 83  --   --   BILITOT 0.5 0.5  --   --    ------------------------------------------------------------------------------------------------------------------ estimated creatinine clearance is 23.8 mL/min (A) (by C-G formula based on SCr of 2.28 mg/dL (H)). ------------------------------------------------------------------------------------------------------------------  Recent Labs  10/15/16 0433  TSH 1.609    Cardiac Enzymes  Recent Labs Lab 10/15/16 0433  TROPONINI <0.03   ------------------------------------------------------------------------------------------------------------------ Invalid input(s): POCBNP ---------------------------------------------------------------------------------------------------------------  RADIOLOGY: Dg Abd 1 View  Result Date: 10/16/2016 CLINICAL  DATA:  Intubation; OG tube placement; hx/o asthma, COPD, HTN, and pneumonia; smoker. EXAM: ABDOMEN - 1 VIEW COMPARISON:  CT, 10/05/2014  FINDINGS: Nasal/orogastric tube passes below the diaphragm. Tip projects in the proximal stomach. Normal bowel gas pattern. No evidence of obstruction. Right common iliac artery stent is noted. IMPRESSION: 1. Nasal/orogastric tube tip projects in the proximal stomach. Electronically Signed   By: Amie Portland M.D.   On: 10/16/2016 14:34   Dg Chest Port 1 View  Result Date: 10/16/2016 CLINICAL DATA:  Respiratory difficulty EXAM: PORTABLE CHEST 1 VIEW COMPARISON:  150 hours FINDINGS: Endotracheal tube placed. Tip is 5.8 cm from the carina. Left jugular central venous catheter place. Tip is in the mid SVC. NG tube tip is beyond the gastroesophageal junction. Lungs are hyperaerated. Subcentimeter nodular density in the left mid lung zone is stable. It may represent a nipple shadow. No pneumothorax. No pleural effusion. Normal heart size. IMPRESSION: Endotracheal tube as described Left jugular central venous catheter tip in the SVC and no pneumothorax Left mid lung nodular density is stable. It may represent a nipple shadow. Attention on follow-up is recommended. Electronically Signed   By: Jolaine Click M.D.   On: 10/16/2016 14:36   Dg Chest Port 1 View  Result Date: 10/16/2016 CLINICAL DATA:  Increased shortness of breath.  Cough. EXAM: PORTABLE CHEST 1 VIEW COMPARISON:  Radiographs yesterday. FINDINGS: The cardiomediastinal contours are normal. Stable hyperinflation. Pulmonary vasculature is normal. No consolidation, pleural effusion, or pneumothorax. No acute osseous abnormalities are seen. IMPRESSION: Stable hyperinflation. No acute abnormality or change from prior exam. Electronically Signed   By: Rubye Oaks M.D.   On: 10/16/2016 02:29   Dg Abd Portable 1v  Result Date: 10/16/2016 CLINICAL DATA:  NG tube placement EXAM: PORTABLE ABDOMEN - 1 VIEW COMPARISON:  10/16/2016 at 1417 hours FINDINGS: Enteric tube in the gastric body. Nonobstructive bowel gas pattern. Vascular stent in the right iliac. Lung  bases are clear. IMPRESSION: Enteric tube in the gastric body. Electronically Signed   By: Charline Bills M.D.   On: 10/16/2016 16:49    EKG:  Orders placed or performed during the hospital encounter of 10/15/16  . ED EKG  . ED EKG  . EKG 12-Lead  . EKG 12-Lead    ASSESSMENT AND PLAN:  Active Problems:   Acute on chronic respiratory failure with hypoxemia (HCC)   Protein-calorie malnutrition, severe  #1. Acute on chronic respiratory failure with hypoxia due to COPD exacerbation, Acute respiratory failure status post intubation, continue full vent support, spiked fever yesterday, and vancomycin.  #2. Hypotension; likely due to sedation: On Levophed.   #2. COPD exacerbation due to acute bronchitis, continue  patient on levofloxacin, Pulmicort, DuoNeb's, prednisone, Spiriva, follow clinically   #3. Acute kidney injury secondary to hypotension ; has hyperkalemia and worsening renal function: Hold Lasix, monitor urine output, monitor kidney function closely.   #4. Lower extremity swelling, likely due to right-sided heart failure, ; hold  Lasix secondary to worsening renal function.  #5. Tobacco abuse. Counseled  on admission Condition;critical  Management plans discussed with the patient, family and they are in agreement.   DRUG ALLERGIES:  Allergies  Allergen Reactions  . Aspirin Other (See Comments)    Unknown  . Celecoxib Other (See Comments)    Reaction: Unknown  . Nsaids Other (See Comments)    Reaction: Unknown    CODE STATUS:     Code Status Orders        Start  Ordered   10/15/16 0823  Full code  Continuous     10/15/16 0822    Code Status History    Date Active Date Inactive Code Status Order ID Comments User Context   10/04/2016  4:43 AM 10/05/2016  2:59 PM Full Code [id number]  Arnaldo Nataliamond, Michael S, MD Inpatient   05/27/2016  9:28 AM 05/27/2016  2:35 PM Full Code [id number]  Annice Needyew, Jason S, MD Inpatient   11/10/2011  9:36 AM 11/11/2011  4:58 PM Full Code  [id number]  Christiane HaSullivan, Corinna L, MD Inpatient      TOTAL TIME TAKING CARE OF THIS PATIENT:40 minutes.    Katha HammingKONIDENA,Xoe Hoe M.D on 10/17/2016 at 8:15 AM  Between 7am to 6pm - Pager - [phone]  After 6pm go to [email or web address] - password [email or web address] [facility]  [facility]  Office  [phone]  CC: Primary care physician; Leanna SatoMiles, Linda M, MD

## 2016-10-17 NOTE — Consult Note (Signed)
PULMONARY / CRITICAL CARE MEDICINE   Name: Melissa Pulido MRN: [id number] DOB: 1950/12/13    ADMISSION DATE:  10/15/2016   CONSULTATION DATE:  10/16/2016  REFERRING MD:  Dr. Sheryle Hail  REASON: Acute respiratory failure   CHIEF COMPLAINT:  Worsening dyspnea   Progressive resp failure and failed biPAP therapy   HISTORY OF PRESENT ILLNESS: Now intubated,sedated on full vent support Remains critically ill Severe copd exacerbation  REVIEW OF SYSTEMS:   Unable to provide due to critical illness   VITAL SIGNS: BP 99/72 (BP Location: Left Arm)   Pulse 97   Temp 99 F (37.2 C) (Oral)   Resp (!) 22   Ht 6' (1.829 m)   Wt 115 lb 1.3 oz (52.2 kg)   SpO2 98%   BMI 15.61 kg/m   HEMODYNAMICS:    VENTILATOR SETTINGS: Vent Mode: PRVC FiO2 (%):  [30 %-40 %] 30 % Set Rate:  [18 bmp-22 bmp] 22 bmp Vt Set:  [450 mL] 450 mL PEEP:  [5 cmH20] 5 cmH20 Plateau Pressure:  [22 cmH20-32 cmH20] 30 cmH20  INTAKE / OUTPUT: I/O last 3 completed shifts: In: 1239 [I.V.:650.4; NG/GT:488.7; IV Piggyback:100] Out: 1190 [Urine:1190]  PHYSICAL EXAMINATION: General:  Cachetic, intubated, GCS<8T Neuro: AAO X3, no focal deficits HEENT: PERRLA, no JVD Cardiovascular: tachycardic, regular, S1/S2, no MRG, +2 pulses bilaterally, +1 edema Lungs: Increased WOB, +inspiratory and expiratory wheezes, no rhonchi or crackles Abdomen:  Non-distended, normal bowel sounds   LABS:  BMET  Recent Labs Lab 10/15/16 0433 10/16/16 0422 10/17/16 0450  NA 138 141 137  K 4.1 5.2* 5.4*  CL 102 98* 99*  CO2 26 33* 28  BUN 16 24* 59*  CREATININE 1.11 1.33* 2.28*  GLUCOSE 143* 140* 137*    Electrolytes  Recent Labs Lab 10/15/16 0433 10/16/16 0422 10/16/16 1801 10/17/16 0450  CALCIUM 8.6* 9.1  --  8.0*  MG  --   --  2.5* 2.4  PHOS  --   --  4.5 5.4*    CBC  Recent Labs Lab 10/15/16 0433 10/16/16 0503  WBC 6.4 17.1*  HGB 15.5 15.6  HCT 45.9 46.7  PLT 194 212    ABG  Recent Labs Lab  10/16/16 1000 10/16/16 1400 10/17/16 0410  PHART 7.35 7.34* 7.29*  PCO2ART 56* 57* 61*  PO2ART 87 66* 99    Imaging Dg Abd 1 View  Result Date: 10/16/2016 CLINICAL DATA:  Intubation; OG tube placement; hx/o asthma, COPD, HTN, and pneumonia; smoker. EXAM: ABDOMEN - 1 VIEW COMPARISON:  CT, 10/05/2014 FINDINGS: Nasal/orogastric tube passes below the diaphragm. Tip projects in the proximal stomach. Normal bowel gas pattern. No evidence of obstruction. Right common iliac artery stent is noted. IMPRESSION: 1. Nasal/orogastric tube tip projects in the proximal stomach. Electronically Signed   By: Amie Portland M.D.   On: 10/16/2016 14:34   Dg Chest Port 1 View  Result Date: 10/16/2016 CLINICAL DATA:  Respiratory difficulty EXAM: PORTABLE CHEST 1 VIEW COMPARISON:  150 hours FINDINGS: Endotracheal tube placed. Tip is 5.8 cm from the carina. Left jugular central venous catheter place. Tip is in the mid SVC. NG tube tip is beyond the gastroesophageal junction. Lungs are hyperaerated. Subcentimeter nodular density in the left mid lung zone is stable. It may represent a nipple shadow. No pneumothorax. No pleural effusion. Normal heart size. IMPRESSION: Endotracheal tube as described Left jugular central venous catheter tip in the SVC and no pneumothorax Left mid lung nodular density is stable. It may represent a nipple  shadow. Attention on follow-up is recommended. Electronically Signed   By: Jolaine ClickArthur  Hoss M.D.   On: 10/16/2016 14:36   Dg Abd Portable 1v  Result Date: 10/16/2016 CLINICAL DATA:  NG tube placement EXAM: PORTABLE ABDOMEN - 1 VIEW COMPARISON:  10/16/2016 at 1417 hours FINDINGS: Enteric tube in the gastric body. Nonobstructive bowel gas pattern. Vascular stent in the right iliac. Lung bases are clear. IMPRESSION: Enteric tube in the gastric body. Electronically Signed   By: Charline BillsSriyesh  Krishnan M.D.   On: 10/16/2016 16:49    STUDIES:  5/18 Echo:The estimated ejection   fraction was in the range  of 55% to 60%. Wall motion was normal;   there were no regional wall motion abnormalities. Doppler   parameters are consistent with abnormal left ventricular   relaxation (grade 1 diastolic dysfunction).   CULTURES:  none  ANTIBIOTICS: Levaquin>  SIGNIFICANT EVENTS: 05/08>discharged 5/18>re-admitted with Acute respiratory failure 5/19> intubated,Left IJ CVL placed LINES/TUBES: PIVs  DISCUSSION: 66 Y/O [name] presenting with recurrent respiratory failure, acute COPD exacerbation and Acute renal failure with severe resp failure and intubated and placed on MV support after failing biPAP therapy  ASSESSMENT Acute COPD exacerbation pneumonia Acute hypercarbic respiratory failure Leukocytosis Uncontrolled hypertension H/O T2DM H/O CHF  1.Respiratory Failure -continue Full MV support -continue Bronchodilator Therapy -Wean Fio2 and PEEP as tolerated  2.COPD exacerbation with pneumonia -continue BD therapy Continue IV abx -continue IV steroids  DVT/GI prx   Critical Care Time devoted to patient care services described in this note is 35 minutes.   Overall, patient is critically ill, prognosis is guarded.    Lucie LeatherKurian David Alyannah Sanks, M.D.  [facility]  Medical Director [facility] Medical Director [facility] Card[facility]

## 2016-10-17 NOTE — Progress Notes (Addendum)
Transfer patient to ICU per Dr Belia Hemankasa  Also bolus pt and call neph consult d/t elevated creat and low UOP

## 2016-10-17 NOTE — Progress Notes (Signed)
Pt seems to be fighting vent and etco2 has risen form 28 to 51. RN giving sedation. NP aware.

## 2016-10-17 NOTE — Consult Note (Signed)
[facility] CONSULT NOTE    Date: 10/17/2016                  Patient Name:  Kevin Holden  MRN: [id number]  DOB: [birth year]  Age / Sex: 66 y.o., male         PCP: Marguerita Merles, MD                 Service Requesting Consult: Pulmonary/critical care                 Reason for Consult: Acute renal failure            History of Present Illness: Patient is a 66 y.o. male with a PMHx of Asthma, COPD, hypertension, prior history of pneumonia, who was admitted to [facility] on 10/15/2016 for evaluation of increasing shortness of breath.  Patient was recently admitted to [facility] for COPD exacerbation. He was on a prednisone taper and azithromycin as well. Unfortunately he had worsening shortness of breath. He was brought in for another COPD exacerbation.  He was subsequently transitioned to BiPAP.  However he was still doing poorly and yesterday he was subsequently intubated.  Since that time the patient has had periods of significant hypotension. Blood pressure was as low as 71/49 yesterday. Blood pressure currently is 119/78.  His baseline creatinine is 0.9 on 10/04/16. Currently his creatinine has risen to 2.28.   Medications: Outpatient medications: Prescriptions Prior to Admission  Medication Sig Dispense Refill Last Dose  . albuterol (PROVENTIL HFA;VENTOLIN HFA) 108 (90 Base) MCG/ACT inhaler Inhale 1-2 puffs into the lungs every 6 (six) hours as needed for wheezing or shortness of breath. 6.7 g 0 prn at prn  . cholecalciferol (VITAMIN D) 1000 units tablet Take 1,000 Units by mouth daily.   10/14/2016 at Unknown time  . Ferrous Gluconate (IRON 27 PO) Take 1 tablet by mouth daily.   10/14/2016 at Unknown time  . furosemide (LASIX) 20 MG tablet Take 20 mg by mouth daily as needed for edema.    prn at prn  . gabapentin (NEURONTIN) 300 MG capsule Take 900 mg by mouth 3 (three) times daily.    10/14/2016 at Unknown time  . loratadine (CLARITIN) 10 MG  tablet Take 10 mg by mouth daily.    10/14/2016 at Unknown time  . methocarbamol (ROBAXIN) 500 MG tablet Take 500 mg by mouth every 6 (six) hours as needed for muscle spasms.   prn at prn  . mometasone-formoterol (DULERA) 200-5 MCG/ACT AERO Inhale 2 puffs into the lungs 2 (two) times daily.   10/14/2016 at Unknown time  . omeprazole (PRILOSEC) 20 MG capsule Take 20 mg by mouth daily.   10/14/2016 at Unknown time  . tiotropium (SPIRIVA) 18 MCG inhalation capsule Place 18 mcg into inhaler and inhale daily.   10/14/2016 at Unknown time    Current medications: Current Facility-Administered Medications  Medication Dose Route Frequency Provider Last Rate Last Dose  . acetaminophen (TYLENOL) tablet 650 mg  650 mg Oral Q6H PRN Harrie Foreman, MD   650 mg at 10/17/16 9169   Or  . acetaminophen (TYLENOL) suppository 650 mg  650 mg Rectal Q6H PRN Harrie Foreman, MD      . budesonide (PULMICORT) nebulizer solution 0.5 mg  0.5 mg Nebulization BID Flora Lipps, MD      . chlorhexidine gluconate (MEDLINE KIT) (PERIDEX) 0.12 % solution 15 mL  15 mL Mouth Rinse BID Flora Lipps, MD  15 mL at 10/17/16 0802  . cholecalciferol (VITAMIN D) tablet 1,000 Units  1,000 Units Oral Daily Harrie Foreman, MD   1,000 Units at 10/15/16 1114  . docusate sodium (COLACE) capsule 100 mg  100 mg Oral BID Harrie Foreman, MD   100 mg at 10/16/16 2210  . enoxaparin (LOVENOX) injection 30 mg  30 mg Subcutaneous Q24H Loree Fee, [place]      . famotidine (PEPCID) IVPB 20 mg premix  20 mg Intravenous Q24H Flora Lipps, MD   Stopped at 10/16/16 1608  . feeding supplement (BOOST / RESOURCE BREEZE) liquid 1 Container  1 Container Oral TID BM Theodoro Grist, MD   1 Container at 10/15/16 1538  . feeding supplement (PRO-STAT SUGAR FREE 64) liquid 30 mL  30 mL Per Tube BID Flora Lipps, MD   30 mL at 10/17/16 0937  . feeding supplement (VITAL HIGH PROTEIN) liquid 1,000 mL  1,000 mL Per Tube Q24H Flora Lipps, MD      .  fentaNYL (SUBLIMAZE) bolus via infusion 50-100 mcg  50-100 mcg Intravenous Q1H PRN Dorene Sorrow S, NP   50 mcg at 10/17/16 1053  . fentaNYL 2583mg in NS 2565m(10105mml) infusion-PREMIX  25-400 mcg/hr Intravenous Continuous KasFlora LippsD 20 mL/hr at 10/17/16 1045 200 mcg/hr at 10/17/16 1045  . ferrous gluconate (FERGON) tablet 324 mg  324 mg Oral Daily DiaHarrie ForemanD   324 mg at 10/15/16 1114  . gabapentin (NEURONTIN) 250 MG/5ML solution 900 mg  900 mg Per Tube TID KonEpifanio LeschesD   900 mg at 10/17/16 0937  . insulin aspart (novoLOG) injection 0-15 Units  0-15 Units Subcutaneous Q4H TukMikael SprayP   3 Units at 10/17/16 0413  . ipratropium-albuterol (DUONEB) 0.5-2.5 (3) MG/3ML nebulizer solution 3 mL  3 mL Nebulization Q2H PRN WilLance CoonD   3 mL at 10/17/16 0259  . labetalol (NORMODYNE,TRANDATE) injection 10 mg  10 mg Intravenous Q2H PRN WilLance CoonD   10 mg at 10/16/16 0852  . Levofloxacin (LEVAQUIN) IVPB 250 mg  250 mg Intravenous q1800 KonEpifanio LeschesD 50 mL/hr at 10/16/16 1700 250 mg at 10/16/16 1700  . loratadine (CLARITIN) tablet 10 mg  10 mg Oral Daily DiaHarrie ForemanD   10 mg at 10/15/16 1114  . MEDLINE mouth rinse  15 mL Mouth Rinse 10 times per day KasFlora LippsD   15 mL at 10/17/16 0937  . methocarbamol (ROBAXIN) tablet 500 mg  500 mg Oral Q6H PRN DiaHarrie ForemanD   500 mg at 10/16/16 0047  . methylPREDNISolone sodium succinate (SOLU-MEDROL) 40 mg/mL injection 40 mg  40 mg Intravenous Q12H KasFlora LippsD      . midazolam (VERSED) injection 2-4 mg  2-4 mg Intravenous Q15 min PRN TukDorene Sorrow NP      . midazolam (VERSED) injection 2-4 mg  2-4 mg Intravenous Q2H PRN TukMikael SprayP   2 mg at 10/17/16 0044  . morphine 2 MG/ML injection 2-4 mg  2-4 mg Intravenous Q3H PRN TukDorene Sorrow NP   2 mg at 10/16/16 1253  . multivitamin with minerals tablet 1 tablet  1 tablet Oral Daily VaiTheodoro GristD   1  tablet at 10/15/16 1538  . nicotine (NICODERM CQ - dosed in mg/24 hours) patch 21 mg  21 mg Transdermal Daily VaiTheodoro GristD   21 mg at 10/16/16 1127  . norepinephrine (LEVOPHED) 4 mg in dextrose 5 %  250 mL (0.016 mg/mL) infusion  0-40 mcg/min Intravenous Titrated Flora Lipps, MD 7.5 mL/hr at 10/17/16 1120 2 mcg/min at 10/17/16 1120  . ondansetron (ZOFRAN) tablet 4 mg  4 mg Oral Q6H PRN Harrie Foreman, MD       Or  . ondansetron [facility]) injection 4 mg  4 mg Intravenous Q6H PRN Harrie Foreman, MD      . pantoprazole (PROTONIX) EC tablet 40 mg  40 mg Oral Daily Harrie Foreman, MD   40 mg at 10/15/16 1115  . sennosides (SENOKOT) 8.8 MG/5ML syrup 5 mL  5 mL Per Tube BID PRN Flora Lipps, MD      . tiotropium (SPIRIVA) inhalation capsule 18 mcg  18 mcg Inhalation Daily Harrie Foreman, MD   18 mcg at 10/15/16 1115      Allergies: Allergies  Allergen Reactions  . Aspirin Other (See Comments)    Unknown  . Celecoxib Other (See Comments)    Reaction: Unknown  . Nsaids Other (See Comments)    Reaction: Unknown      Past Medical History: Past Medical History:  Diagnosis Date  . Asthma   . COPD (chronic obstructive pulmonary disease) ([place])   . HTN (hypertension)   . Pneumonia      Past Surgical History: Past Surgical History:  Procedure Laterality Date  . ABDOMINAL SURGERY    . BACK SURGERY    . PERIPHERAL VASCULAR CATHETERIZATION Right 05/27/2016   Procedure: Lower Extremity Angiography;  Surgeon: Algernon Huxley, MD;  Location: [place] [facility];  Service: Cardiovascular;  Laterality: Right;  . PERIPHERAL VASCULAR CATHETERIZATION N/A 05/27/2016   Procedure: Abdominal Aortogram w/Lower Extremity;  Surgeon: Algernon Huxley, MD;  Location: [place] [facility];  Service: Cardiovascular;  Laterality: N/A;  . PERIPHERAL VASCULAR CATHETERIZATION  05/27/2016   Procedure: Lower Extremity Intervention;  Surgeon: Algernon Huxley, MD;  Location: [place] [facility];  Service:  Cardiovascular;;  . VASCULAR SURGERY     2016 balloon in vein in right leg     Family History: History reviewed. No pertinent family history.   Social History: Social History   Social History  . Marital status: Single    Spouse name: N/A  . Number of children: N/A  . Years of education: N/A   Occupational History  . Not on file.   Social History Main Topics  . Smoking status: Current Every Day Smoker    Packs/day: 1.00    Years: 40.00    Types: Cigarettes  . Smokeless tobacco: Never Used  . Alcohol use No  . Drug use: No  . Sexual activity: No   Other Topics Concern  . Not on file   Social History Narrative  . No narrative on file     Review of Systems: Patient cannot provide as he is currently intubated  Vital Signs: Blood pressure 119/78, pulse 88, temperature 99 F (37.2 C), temperature source Oral, resp. rate (!) 21, height 6' (1.829 m), weight 52.2 kg (115 lb 1.3 oz), SpO2 99 %.  Weight trends: Filed Weights   10/15/16 0831 10/16/16 0500 10/17/16 0447  Weight: 54.4 kg (120 lb) 54.6 kg (120 lb 5.9 oz) 52.2 kg (115 lb 1.3 oz)    Physical Exam: General: Critically ill appearing  Head: Normocephalic, atraumatic.  Eyes: Anicteric, EOMI  Nose: Mucous membranes moist, not inflammed, nonerythematous.  Throat: Endotracheal tube noted to be in place.   Neck: Supple, trachea midline.  Lungs:  Scattered rhonchi  and mild bilateral wheezing, vent assisted   Heart: RRR. S1 and S2 normal without gallop, murmur, or rubs.  Abdomen:  BS normoactive. Soft, Nondistended, non-tender.  No masses or organomegaly.  Extremities: No pretibial edema.  Neurologic: Patient is on the ventilator but is arousable and will follow simple commands   Skin: No visible rashes, scars.    Lab results: Basic Metabolic Panel:  Recent Labs Lab 10/15/16 0433 10/16/16 0422 10/16/16 1801 10/17/16 0450  NA 138 141  --  137  K 4.1 5.2*  --  5.4*  CL 102 98*  --  99*  CO2 26 33*   --  28  GLUCOSE 143* 140*  --  137*  BUN 16 24*  --  59*  CREATININE 1.11 1.33*  --  2.28*  CALCIUM 8.6* 9.1  --  8.0*  MG  --   --  2.5* 2.4  PHOS  --   --  4.5 5.4*    Liver Function Tests:  Recent Labs Lab 10/15/16 0433 10/16/16 0422  AST 26 32  ALT 14* 16*  ALKPHOS 75 83  BILITOT 0.5 0.5  PROT 7.9 8.7*  ALBUMIN 3.7 4.0   No results for input(s): LIPASE, AMYLASE in the last 168 hours. No results for input(s): AMMONIA in the last 168 hours.  CBC:  Recent Labs Lab 10/15/16 0433 10/16/16 0503  WBC 6.4 17.1*  NEUTROABS 4.4  --   HGB 15.5 15.6  HCT 45.9 46.7  MCV 83.1 82.8  PLT 194 212    Cardiac Enzymes:  Recent Labs Lab 10/15/16 0433  TROPONINI <0.03    BNP: Invalid input(s): POCBNP  CBG:  Recent Labs Lab 10/16/16 1530 10/16/16 1959 10/17/16 0000 10/17/16 0348 10/17/16 0732  GLUCAP 137* 136* 113* 155* 114*    Microbiology: Results for orders placed or performed during the hospital encounter of 10/15/16  MRSA PCR Screening     Status: None   Collection Time: 10/16/16  4:42 AM  Result Value Ref Range Status   MRSA by PCR NEGATIVE NEGATIVE Final    Comment:        The GeneXpert MRSA Assay (FDA approved for NASAL specimens only), is one component of a comprehensive MRSA colonization surveillance program. It is not intended to diagnose MRSA infection nor to guide or monitor treatment for MRSA infections.     Coagulation Studies: No results for input(s): LABPROT, INR in the last 72 hours.  Urinalysis: No results for input(s): COLORURINE, LABSPEC, PHURINE, GLUCOSEU, HGBUR, BILIRUBINUR, KETONESUR, PROTEINUR, UROBILINOGEN, NITRITE, LEUKOCYTESUR in the last 72 hours.  Invalid input(s): APPERANCEUR    Imaging: Dg Abd 1 View  Result Date: 10/16/2016 CLINICAL DATA:  Intubation; OG tube placement; hx/o asthma, COPD, HTN, and pneumonia; smoker. EXAM: ABDOMEN - 1 VIEW COMPARISON:  CT, 10/05/2014 FINDINGS: Nasal/orogastric tube passes below  the diaphragm. Tip projects in the proximal stomach. Normal bowel gas pattern. No evidence of obstruction. Right common iliac artery stent is noted. IMPRESSION: 1. Nasal/orogastric tube tip projects in the proximal stomach. Electronically Signed   By: Lajean Manes M.D.   On: 10/16/2016 14:34   Dg Chest Port 1 View  Result Date: 10/16/2016 CLINICAL DATA:  Respiratory difficulty EXAM: PORTABLE CHEST 1 VIEW COMPARISON:  150 hours FINDINGS: Endotracheal tube placed. Tip is 5.8 cm from the carina. Left jugular central venous catheter place. Tip is in the mid SVC. NG tube tip is beyond the gastroesophageal junction. Lungs are hyperaerated. Subcentimeter nodular density in the left mid lung zone  is stable. It may represent a nipple shadow. No pneumothorax. No pleural effusion. Normal heart size. IMPRESSION: Endotracheal tube as described Left jugular central venous catheter tip in the SVC and no pneumothorax Left mid lung nodular density is stable. It may represent a nipple shadow. Attention on follow-up is recommended. Electronically Signed   By: Marybelle Killings M.D.   On: 10/16/2016 14:36   Dg Chest Port 1 View  Result Date: 10/16/2016 CLINICAL DATA:  Increased shortness of breath.  Cough. EXAM: PORTABLE CHEST 1 VIEW COMPARISON:  Radiographs yesterday. FINDINGS: The cardiomediastinal contours are normal. Stable hyperinflation. Pulmonary vasculature is normal. No consolidation, pleural effusion, or pneumothorax. No acute osseous abnormalities are seen. IMPRESSION: Stable hyperinflation. No acute abnormality or change from prior exam. Electronically Signed   By: Jeb Levering M.D.   On: 10/16/2016 02:29   Dg Abd Portable 1v  Result Date: 10/16/2016 CLINICAL DATA:  NG tube placement EXAM: PORTABLE ABDOMEN - 1 VIEW COMPARISON:  10/16/2016 at 1417 hours FINDINGS: Enteric tube in the gastric body. Nonobstructive bowel gas pattern. Vascular stent in the right iliac. Lung bases are clear. IMPRESSION: Enteric tube in  the gastric body. Electronically Signed   By: Julian Hy M.D.   On: 10/16/2016 16:49      Assessment & Plan: Pt is a 66 y.o. male with a PMHx of Asthma, COPD, hypertension, prior history of pneumonia, who was admitted to [facility] on 10/15/2016 for evaluation of increasing shortness of breath.   1. Acute renal failure secondary to hypotension.  Baseline creatinine is 0.9. Currently creatinine is 2.2. We will proceed with renal ultrasound to make sure there is no underlying obstruction. Continue pressors to maintain a map of 65 or greater. Avoid nephrotoxins as possible. No indication for dialysis at the moment.  2. Hyperkalemia. Serum potassium slowly high at 5.4. We will administer patiormer for the next day or two.   3. Hypotension. Contributing to acute renal failure. Continue norepinephrine to maintain a map of 65 or greater.  4. Acute respiratory failure. Patient has underlying COPD. He may prove difficult to wean from the ventilator. Continue ventilatory support as per pulmonary/critical care at the moment.

## 2016-10-17 NOTE — Progress Notes (Signed)
Sedation titrated back to 100%; pt became agitated and tachycardic during visit from family and nephrologist.  Vent alarming

## 2016-10-17 NOTE — Progress Notes (Signed)
Nutrition Follow-up  DOCUMENTATION CODES:   Severe malnutrition in context of chronic illness  INTERVENTION:  1. Decrease Vital 1.5 to 7545mL/hr --> provides 1620 calories, 73gm protein, 821cc free water 2. MVI liquid  NUTRITION DIAGNOSIS:   Malnutrition related to catabolic illness as evidenced by severe depletion of muscle mass, severe depletion of body fat. -ongoing  GOAL:   Patient will meet greater than or equal to 90% of their needs -meeting via TFs  MONITOR:   PO intake, Supplement acceptance, Labs, Weight trends  REASON FOR ASSESSMENT:   Other (Comment) (low BMI)    ASSESSMENT:   66 year old male with h/o HTN and COPD admitted for acute on chronic respiratory failure with hypoxemia.  Patient is currently intubated on ventilator support MV: 10 L/min Temp (24hrs), Avg:98.6 F (37 C), Min:98.1 F (36.7 C), Max:99.5 F (37.5 C) Propofol: none Discussed in Rounds Labs and medications reviewed: Fentanyl gtt Colace, Vitamin D, Fergon, Solumedrol Tolerating TFs  Diet Order:     Skin:  Reviewed, no issues  Last BM:  5/17  Height:   Ht Readings from Last 1 Encounters:  10/16/16 6' (1.829 m)    Weight:   Wt Readings from Last 1 Encounters:  10/18/16 119 lb 14.9 oz (54.4 kg)    Ideal Body Weight:  80.9 kg  BMI:  Body mass index is 16.27 kg/m.  Estimated Nutritional Needs:   Kcal:  1842 calories  Protein:  62-78 gm  Fluid:  Per MD/NP/PA  EDUCATION NEEDS:   Education needs no appropriate at this time  Kevin AnoWilliam M. Donnae Michels, MS, RD LDN Inpatient Clinical Dietitian Pager [phone]

## 2016-10-18 ENCOUNTER — Inpatient Hospital Stay: Payer: Medicare Other

## 2016-10-18 DIAGNOSIS — Z9911 Dependence on respirator [ventilator] status: Secondary | ICD-10-CM

## 2016-10-18 LAB — BASIC METABOLIC PANEL
Anion gap: 5 (ref 5–15)
BUN: 81 mg/dL — AB (ref 6–20)
CHLORIDE: 104 mmol/L (ref 101–111)
CO2: 29 mmol/L (ref 22–32)
CREATININE: 2.1 mg/dL — AB (ref 0.61–1.24)
Calcium: 8.2 mg/dL — ABNORMAL LOW (ref 8.9–10.3)
GFR calc Af Amer: 36 mL/min — ABNORMAL LOW (ref >60)
GFR calc non Af Amer: 31 mL/min — ABNORMAL LOW (ref >60)
GLUCOSE: 141 mg/dL — AB (ref 65–99)
Potassium: 4.9 mmol/L (ref 3.5–5.1)
Sodium: 138 mmol/L (ref 135–145)

## 2016-10-18 LAB — CBC
HCT: 39 % — ABNORMAL LOW (ref 40.0–52.0)
HEMOGLOBIN: 12.8 g/dL — AB (ref 13.0–18.0)
MCH: 27.9 pg (ref 26.0–34.0)
MCHC: 32.9 g/dL (ref 32.0–36.0)
MCV: 84.8 fL (ref 80.0–100.0)
Platelets: 151 10*3/uL (ref 150–440)
RBC: 4.6 MIL/uL (ref 4.40–5.90)
RDW: 15.3 % — ABNORMAL HIGH (ref 11.5–14.5)
WBC: 9.1 10*3/uL (ref 3.8–10.6)

## 2016-10-18 LAB — PHOSPHORUS: Phosphorus: 4.5 mg/dL (ref 2.5–4.6)

## 2016-10-18 LAB — GLUCOSE, CAPILLARY
Glucose-Capillary: 167 mg/dL — ABNORMAL HIGH (ref 65–99)
Glucose-Capillary: 125 mg/dL — ABNORMAL HIGH (ref 65–99)
Glucose-Capillary: 162 mg/dL — ABNORMAL HIGH (ref 65–99)
Glucose-Capillary: 131 mg/dL — ABNORMAL HIGH (ref 65–99)
Glucose-Capillary: 178 mg/dL — ABNORMAL HIGH (ref 65–99)

## 2016-10-18 LAB — MAGNESIUM: Magnesium: 2.5 mg/dL — ABNORMAL HIGH (ref 1.7–2.4)

## 2016-10-18 MED ORDER — BUDESONIDE 0.25 MG/2ML IN SUSP
0.2500 mg | Freq: Four times a day (QID) | RESPIRATORY_TRACT | Status: DC
  Administered 2016-10-18 – 2016-11-09 (×87): 0.25 mg via RESPIRATORY_TRACT
  Filled 2016-10-18 (×86): qty 2

## 2016-10-18 MED ORDER — METOPROLOL TARTRATE 5 MG/5ML IV SOLN
2.5000 mg | INTRAVENOUS | Status: DC | PRN
  Administered 2016-10-26: 5 mg via INTRAVENOUS
  Administered 2016-10-27 (×2): 2.5 mg via INTRAVENOUS
  Filled 2016-10-18 (×3): qty 5

## 2016-10-18 MED ORDER — HYDRALAZINE HCL 20 MG/ML IJ SOLN: 10.0000 mg | INTRAMUSCULAR | Status: DC | PRN

## 2016-10-18 MED ORDER — FENTANYL 2500MCG IN NS 250ML (10MCG/ML) PREMIX INFUSION
0.0000 ug/h | INTRAVENOUS | Status: DC
  Administered 2016-10-18: 200 ug/h via INTRAVENOUS
  Administered 2016-10-19: 125 ug/h via INTRAVENOUS
  Administered 2016-10-19: 250 ug/h via INTRAVENOUS
  Administered 2016-10-20: 50 ug/h via INTRAVENOUS
  Administered 2016-10-21: 175 ug/h via INTRAVENOUS
  Administered 2016-10-22: 150 ug/h via INTRAVENOUS
  Administered 2016-10-22: 175 ug/h via INTRAVENOUS
  Administered 2016-10-23 – 2016-10-25 (×5): 150 ug/h via INTRAVENOUS
  Administered 2016-10-26: 125 ug/h via INTRAVENOUS
  Administered 2016-10-27 – 2016-10-28 (×2): 100 ug/h via INTRAVENOUS
  Filled 2016-10-18 (×15): qty 250

## 2016-10-18 MED ORDER — MIDAZOLAM HCL 2 MG/2ML IJ SOLN
2.0000 mg | INTRAMUSCULAR | Status: DC | PRN
  Administered 2016-10-18 – 2016-10-28 (×20): 2 mg via INTRAVENOUS
  Filled 2016-10-18 (×20): qty 2

## 2016-10-18 MED ORDER — ACETAMINOPHEN 325 MG PO TABS
650.0000 mg | ORAL_TABLET | Freq: Four times a day (QID) | ORAL | Status: DC | PRN
  Administered 2016-11-01 – 2016-11-07 (×4): 650 mg
  Filled 2016-10-18 (×4): qty 2

## 2016-10-18 MED ORDER — ALBUTEROL SULFATE (2.5 MG/3ML) 0.083% IN NEBU: 2.5000 mg | INHALATION_SOLUTION | RESPIRATORY_TRACT | Status: DC | PRN

## 2016-10-18 MED ORDER — IPRATROPIUM-ALBUTEROL 0.5-2.5 (3) MG/3ML IN SOLN
3.0000 mL | Freq: Four times a day (QID) | RESPIRATORY_TRACT | Status: DC
  Administered 2016-10-18 – 2016-11-09 (×88): 3 mL via RESPIRATORY_TRACT
  Filled 2016-10-18 (×88): qty 3

## 2016-10-18 MED ORDER — FAMOTIDINE 20 MG PO TABS
20.0000 mg | ORAL_TABLET | Freq: Every day | ORAL | Status: DC
  Administered 2016-10-18 – 2016-11-02 (×14): 20 mg
  Filled 2016-10-18 (×14): qty 1

## 2016-10-18 MED ORDER — VITAL 1.5 CAL PO LIQD
1000.0000 mL | ORAL | Status: DC
  Administered 2016-10-18 – 2016-10-19 (×3): 1000 mL

## 2016-10-18 NOTE — Progress Notes (Signed)
[facility]  ROUNDING NOTE   Subjective:   UOP 1220 Wbc 9.1 (17.1)  Creatinine 2.1 (2.28)  Off norepinephrine  Objective:  Vital signs in last 24 hours:  Temp:  [98.1 F (36.7 C)-99.5 F (37.5 C)] 98.7 F (37.1 C) (05/21 0800) Pulse Rate:  [88-112] 94 (05/21 0800) Resp:  [14-22] 22 (05/21 0800) BP: (85-148)/(62-102) 104/78 (05/21 0800) SpO2:  [90 %-100 %] 94 % (05/21 0804) FiO2 (%):  [30 %-40 %] 40 % (05/21 0804) Weight:  [54.4 kg (119 lb 14.9 oz)] 54.4 kg (119 lb 14.9 oz) (05/21 0429)  Weight change: 2.2 kg (4 lb 13.6 oz) Filed Weights   10/16/16 0500 10/17/16 0447 10/18/16 0429  Weight: 54.6 kg (120 lb 5.9 oz) 52.2 kg (115 lb 1.3 oz) 54.4 kg (119 lb 14.9 oz)    Intake/Output: I/O last 3 completed shifts: In: 4068.1 [I.V.:1306.4; NG/GT:1661.7; IV Piggyback:1100] Out: 5701 [Urine:1405]   Intake/Output this shift:  Total I/O In: 185 [I.V.:55; NG/GT:130] Out: -   Physical Exam: General: Critically ill  Head: +ETT  Eyes: Anicteric, eyes closed  Neck: Supple, trachea midline  Lungs:  Clear to auscultation, PRVC FiO2 40%  Heart: Regular rate and rhythm  Abdomen:  Soft, nontender, scaphoid  Extremities: no peripheral edema.  Neurologic: Intubated, sedated  Skin: No lesions  GU: Foley with yellow urine    Basic Metabolic Panel:  Recent Labs Lab 10/15/16 0433 10/16/16 0422 10/16/16 1801 10/17/16 0450 10/17/16 1718 10/18/16 0659  NA 138 141  --  137  --  138  K 4.1 5.2*  --  5.4*  --  4.9  CL 102 98*  --  99*  --  104  CO2 26 33*  --  28  --  29  GLUCOSE 143* 140*  --  137*  --  141*  BUN 16 24*  --  59*  --  81*  CREATININE 1.11 1.33*  --  2.28*  --  2.10*  CALCIUM 8.6* 9.1  --  8.0*  --  8.2*  MG  --   --  2.5* 2.4 2.6* 2.5*  PHOS  --   --  4.5 5.4* 6.2* 4.5    Liver Function Tests:  Recent Labs Lab 10/15/16 0433 10/16/16 0422  AST 26 32  ALT 14* 16*  ALKPHOS 75 83  BILITOT 0.5 0.5  PROT 7.9 8.7*  ALBUMIN 3.7 4.0   No  results for input(s): LIPASE, AMYLASE in the last 168 hours. No results for input(s): AMMONIA in the last 168 hours.  CBC:  Recent Labs Lab 10/15/16 0433 10/16/16 0503 10/18/16 0659  WBC 6.4 17.1* 9.1  NEUTROABS 4.4  --   --   HGB 15.5 15.6 12.8*  HCT 45.9 46.7 39.0*  MCV 83.1 82.8 84.8  PLT 194 212 151    Cardiac Enzymes:  Recent Labs Lab 10/15/16 0433  TROPONINI <0.03    BNP: Invalid input(s): POCBNP  CBG:  Recent Labs Lab 10/17/16 1543 10/17/16 1959 10/17/16 2354 10/18/16 0419 10/18/16 0710  GLUCAP 158* 130* 172* 167* 125*    Microbiology: Results for orders placed or performed during the hospital encounter of 10/15/16  MRSA PCR Screening     Status: None   Collection Time: 10/16/16  4:42 AM  Result Value Ref Range Status   MRSA by PCR NEGATIVE NEGATIVE Final    Comment:        The GeneXpert MRSA Assay (FDA approved for NASAL specimens only), is one component of a  comprehensive MRSA colonization surveillance program. It is not intended to diagnose MRSA infection nor to guide or monitor treatment for MRSA infections.     Coagulation Studies: No results for input(s): LABPROT, INR in the last 72 hours.  Urinalysis:  Recent Labs  10/17/16 1241  COLORURINE RED*  LABSPEC 1.016  PHURINE 5.0  GLUCOSEU NEGATIVE  HGBUR MODERATE*  BILIRUBINUR NEGATIVE  KETONESUR NEGATIVE  PROTEINUR 100*  NITRITE NEGATIVE  LEUKOCYTESUR SMALL*      Imaging: Dg Abd 1 View  Result Date: 10/16/2016 CLINICAL DATA:  Intubation; OG tube placement; hx/o asthma, COPD, HTN, and pneumonia; smoker. EXAM: ABDOMEN - 1 VIEW COMPARISON:  CT, 10/05/2014 FINDINGS: Nasal/orogastric tube passes below the diaphragm. Tip projects in the proximal stomach. Normal bowel gas pattern. No evidence of obstruction. Right common iliac artery stent is noted. IMPRESSION: 1. Nasal/orogastric tube tip projects in the proximal stomach. Electronically Signed   By: Lajean Manes M.D.   On:  10/16/2016 14:34   US Renal  Result Date: 10/17/2016 CLINICAL DATA:  Acute renal failure EXAM: RENAL / URINARY TRACT ULTRASOUND COMPLETE COMPARISON:  None. FINDINGS: Right Kidney: Length: 8.1 cm. Echogenicity within normal limits. No mass or hydronephrosis visualized. Left Kidney: Length: 8.6 cm. Echogenicity within normal limits. No mass or hydronephrosis visualized. Bladder: Decompressed with a Foley catheter. IMPRESSION: No cause for acute renal failure identified. Both kidneys are relatively small, measuring less than 9 cm. Electronically Signed   By: Dorise Bullion III M.D   On: 10/17/2016 14:12   Dg Chest Port 1 View  Result Date: 10/18/2016 CLINICAL DATA:  Acute respiratory failure. EXAM: PORTABLE CHEST 1 VIEW COMPARISON:  Radiographs of Oct 16, 2016. FINDINGS: The heart size and mediastinal contours are within normal limits. Both lungs are clear. Endotracheal and nasogastric tubes are unchanged in position. Left internal jugular catheter is unchanged. No pneumothorax or pleural effusion is noted. The visualized skeletal structures are unremarkable. IMPRESSION: Stable support apparatus. No acute cardiopulmonary abnormality seen. Electronically Signed   By: Marijo Conception, M.D.   On: 10/18/2016 07:57   Dg Chest Port 1 View  Result Date: 10/16/2016 CLINICAL DATA:  Respiratory difficulty EXAM: PORTABLE CHEST 1 VIEW COMPARISON:  150 hours FINDINGS: Endotracheal tube placed. Tip is 5.8 cm from the carina. Left jugular central venous catheter place. Tip is in the mid SVC. NG tube tip is beyond the gastroesophageal junction. Lungs are hyperaerated. Subcentimeter nodular density in the left mid lung zone is stable. It may represent a nipple shadow. No pneumothorax. No pleural effusion. Normal heart size. IMPRESSION: Endotracheal tube as described Left jugular central venous catheter tip in the SVC and no pneumothorax Left mid lung nodular density is stable. It may represent a nipple shadow. Attention on  follow-up is recommended. Electronically Signed   By: Marybelle Killings M.D.   On: 10/16/2016 14:36   Dg Abd Portable 1v  Result Date: 10/16/2016 CLINICAL DATA:  NG tube placement EXAM: PORTABLE ABDOMEN - 1 VIEW COMPARISON:  10/16/2016 at 1417 hours FINDINGS: Enteric tube in the gastric body. Nonobstructive bowel gas pattern. Vascular stent in the right iliac. Lung bases are clear. IMPRESSION: Enteric tube in the gastric body. Electronically Signed   By: Julian Hy M.D.   On: 10/16/2016 16:49     Medications:   . famotidine (PEPCID) IV Stopped (10/17/16 1304)  . feeding supplement (VITAL 1.5 CAL) 1,000 mL (10/18/16 0800)  . fentaNYL infusion INTRAVENOUS 200 mcg/hr (10/18/16 0939)  . levofloxacin (LEVAQUIN) IV Stopped (10/17/16 1808)  .  norepinephrine (LEVOPHED) Adult infusion 2 mcg/min (10/18/16 0800)   . budesonide (PULMICORT) nebulizer solution  0.5 mg Nebulization BID  . chlorhexidine gluconate (MEDLINE KIT)  15 mL Mouth Rinse BID  . cholecalciferol  1,000 Units Oral Daily  . docusate  100 mg Per Tube BID  . enoxaparin (LOVENOX) injection  30 mg Subcutaneous Q24H  . ferrous gluconate  324 mg Oral Daily  . gabapentin  900 mg Per Tube TID  . insulin aspart  0-15 Units Subcutaneous Q4H  . loratadine  10 mg Oral Daily  . mouth rinse  15 mL Mouth Rinse 10 times per day  . methylPREDNISolone (SOLU-MEDROL) injection  40 mg Intravenous Q12H  . multivitamin  15 mL Per Tube Daily  . nicotine  21 mg Transdermal Daily  . pantoprazole  40 mg Oral Daily  . tiotropium  18 mcg Inhalation Daily   acetaminophen **OR** acetaminophen, fentaNYL, ipratropium-albuterol, labetalol, methocarbamol, midazolam, midazolam, morphine injection, ondansetron **OR** ondansetron (ZOFRAN) IV, sennosides  Assessment/ Plan:  Kevin Holden is a 66 y.o. black male Asthma/COPD, hypertension, prior history of pneumonia, who was admitted to [facility] on 10/15/2016 for evaluation of increasing shortness of breath. COPD  exacerbation (HCC) [J44.1] Acute on chronic respiratory failure with hypoxemia (HCC) [J96.21]  1. Acute renal failure with hyperkalemia secondary to hypotension.  Baseline creatinine is 0.9. Potassium now at goal. Ultrasound showing small kidneys Nonoliguric urine output.  Creatinine improving.  Discontinue patiromer.    2. Acute respiratory failure: acute exacerbation of COPD/asthma. Intubated. Difficult to wean  3. Hypotension: blood pressure improved.    LOS: 3 Johnpaul Gillentine 5/21/20189:49 AM

## 2016-10-18 NOTE — Consult Note (Signed)
PULMONARY / CRITICAL CARE MEDICINE   Name: Kevin Holden MRN: [id number] DOB: [birth year]    ADMISSION DATE:  10/15/2016    PT PROFILE:   66 y.o. male smoker intubated after failing BiPAP with diagnosis of AECOPD  MAJOR EVENTS/TEST RESULTS: 05/18 Admitted by Hospitalist service with acute on chronic respiratory failure deemed due to AECOPD 05/18 Echo:The estimated ejection   fraction was in the range of 55% to 60%. Wall motion was normal. Doppler parameters are consistent with abnormal left ventricular relaxation (grade 1 diastolic dysfunction).  05/19 Transferred to ICU/SDU for BiPAP. Failed BiPAP and intubated 05/20 Renal US: No cause for acute renal failure identified. Both kidneys are relatively small, measuring less than 9 cm   INDWELLING DEVICES:: ETT 05/19 >>  L IJ CVL 05/19 >>   MICRO DATA: MRSA PCR 05/19 >> NEG Urine 05/20 >>    ANTIMICROBIALS:  Levofloxacin 05/19 >>    SUBJ: RASS -3, -4 on fentanyl infusion. Not F/C  VITAL SIGNS: BP 98/66   Pulse 83   Temp 98.7 F (37.1 C) (Oral)   Resp 14   Ht 6' (1.829 m)   Wt 119 lb 14.9 oz (54.4 kg)   SpO2 92%   BMI 16.27 kg/m   HEMODYNAMICS:    VENTILATOR SETTINGS: Vent Mode: PRVC FiO2 (%):  [30 %-40 %] 40 % Set Rate:  [14 bmp-22 bmp] 14 bmp Vt Set:  [450 mL-600 mL] 600 mL PEEP:  [5 cmH20] 5 cmH20 Plateau Pressure:  [14 cmH20-16 cmH20] 14 cmH20  INTAKE / OUTPUT: I/O last 3 completed shifts: In: 4068.1 [I.V.:1306.4; NG/GT:1661.7; IV Piggyback:1100] Out: 1405 [Urine:1405]  PHYSICAL EXAMINATION:  Gen: Thin, intubated, RASS -4, not F/C HEENT: NCAT, sclerae white Neck: NO LAN, no JVD noted Lungs: distant BS with prolonged exp phase, no wheezes Cardiovascular: normal rate, regular rhythm, no M noted Abdomen: Soft, NT, +BS Ext: no C/C/E Neuro: PERRL, EOMI, MAEs Skin: No lesions noted   LABS:  BMET  Recent Labs Lab 10/16/16 0422 10/17/16 0450 10/18/16 0659  NA 141 137 138  K 5.2* 5.4* 4.9  CL  98* 99* 104  CO2 33* 28 29  BUN 24* 59* 81*  CREATININE 1.33* 2.28* 2.10*  GLUCOSE 140* 137* 141*    Electrolytes  Recent Labs Lab 10/16/16 0422  10/17/16 0450 10/17/16 1718 10/18/16 0659  CALCIUM 9.1  --  8.0*  --  8.2*  MG  --   < > 2.4 2.6* 2.5*  PHOS  --   < > 5.4* 6.2* 4.5  < > = values in this interval not displayed.  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 P:   Cont full vent support - settings reviewed and/or adjusted Cont vent bundle Daily SBT if/when meets criteria  CARDIOVASCULAR A:  Hypertension, controlled P:  Continue current treatment  RENAL A:   AKI, nonoliguric - Cr improved some  CKD P:   Monitor BMET intermittently Monitor I/Os Correct electrolytes as indicated Discussed with Dr Wynelle Link  GASTROINTESTINAL A:   No issues P:  SUP: enteral famotidine Cont TF protocol  HEMATOLOGIC A:   MIld anemia without acute blood loss P:  DVT px: enoxaparin Monitor CBC intermittently Transfuse per usual guidelines   INFECTIOUS A:   Acute bronchitis P:   Monitor temp, WBC count Micro and abx as above   ENDOCRINE A:   DM 2 Steroid induced hyperglycemia P:   Cont SSI protocol  NEUROLOGIC A:   ICU/vent associated discomfort Acute encephalopathy P:   RASS goal: -1, -2 Cont PAD protocol  No family @ bedside  CCM time: 40 mins The above time includes time spent in consultation with patient and/or family members and reviewing care plan on multidisciplinary rounds  Billy Fischeravid Kleigh Hoelzer, MD PCCM service Mobile  [phone] Pager [phone] 10/18/2016 1:13 PM

## 2016-10-18 NOTE — Progress Notes (Signed)
[facility] [place] [facility]   PATIENT NAME: Kevin Holden    MR#:  [id number]  DATE OF BIRTH:  06/24/1950  SUBJECTIVE:  On 40 % O2 with full ventilatory support. Off pressors.   CHIEF COMPLAINT:   Chief Complaint  Patient presents with  . Shortness of Breath  Review of Systems  Unable to perform ROS: Intubated  Constitutional: Negative for chills, fever and weight loss.  HENT: Negative for congestion.   Eyes: Negative for blurred vision and double vision.  Respiratory: Positive for cough, sputum production, shortness of breath and wheezing.   Cardiovascular: Positive for leg swelling. Negative for chest pain, palpitations, orthopnea and PND.  Gastrointestinal: Negative for abdominal pain, blood in stool, constipation, diarrhea, nausea and vomiting.  Genitourinary: Negative for dysuria, frequency, hematuria and urgency.  Musculoskeletal: Negative for falls.  Neurological: Negative for dizziness, tremors, focal weakness and headaches.  Endo/Heme/Allergies: Does not bruise/bleed easily.  Psychiatric/Behavioral: Negative for depression. The patient does not have insomnia.     VITAL SIGNS: Blood pressure 98/66, pulse 83, temperature 98.7 F (37.1 C), temperature source Oral, resp. rate 14, height 6' (1.829 m), weight 54.4 kg (119 lb 14.9 oz), SpO2 92 %.  PHYSICAL EXAMINATION:   GENERAL:  66 y.o.-year-old patient currently intubated, sedated.  Eyes: Pupils equal, round, reactive to light  HEENT: Head atraumatic, normocephalic. no jugular venous distention. No thyroid enlargement, no tenderness. ETT Lungs: Patient has bilateral expiratory wheeze in all lung fields, no crackles.  CARDIOVASCULAR: S1, S2 normal. No murmurs, rubs, or gallops.  ABDOMEN: Soft, , nondistended. Bowel sounds present. No organomegaly or mass.  EXTREMITIES: . Trace lower extremity and pedal edema, No cyanosis, or clubbing.  NEUROLOGIC: intubated , sedated.  SKIN: No  obvious rash, lesion, or ulcer.   RIJ TL  ORDERS/RESULTS REVIEWED:   CBC  Recent Labs Lab 10/15/16 0433 10/16/16 0503 10/18/16 0659  WBC 6.4 17.1* 9.1  HGB 15.5 15.6 12.8*  HCT 45.9 46.7 39.0*  PLT 194 212 151  MCV 83.1 82.8 84.8  MCH 28.0 27.6 27.9  MCHC 33.7 33.4 32.9  RDW 15.3* 15.3* 15.3*  LYMPHSABS 1.4  --   --   MONOABS 0.6  --   --   EOSABS 0.0  --   --   BASOSABS 0.0  --   --    ------------------------------------------------------------------------------------------------------------------  Chemistries   Recent Labs Lab 10/15/16 0433 10/16/16 0422 10/16/16 1801 10/17/16 0450 10/17/16 1718 10/18/16 0659  NA 138 141  --  137  --  138  K 4.1 5.2*  --  5.4*  --  4.9  CL 102 98*  --  99*  --  104  CO2 26 33*  --  28  --  29  GLUCOSE 143* 140*  --  137*  --  141*  BUN 16 24*  --  59*  --  81*  CREATININE 1.11 1.33*  --  2.28*  --  2.10*  CALCIUM 8.6* 9.1  --  8.0*  --  8.2*  MG  --   --  2.5* 2.4 2.6* 2.5*  AST 26 32  --   --   --   --   ALT 14* 16*  --   --   --   --   ALKPHOS 75 83  --   --   --   --   BILITOT 0.5 0.5  --   --   --   --    ------------------------------------------------------------------------------------------------------------------  estimated creatinine clearance is 27 mL/min (A) (by C-G formula based on SCr of 2.1 mg/dL (H)). ------------------------------------------------------------------------------------------------------------------ No results for input(s): TSH, T4TOTAL, T3FREE, THYROIDAB in the last 72 hours.  Invalid input(s): FREET3  Cardiac Enzymes  Recent Labs Lab 10/15/16 0433  TROPONINI <0.03   ------------------------------------------------------------------------------------------------------------------ Invalid input(s): POCBNP ---------------------------------------------------------------------------------------------------------------  RADIOLOGY: Dg Abd 1 View  Result Date: 10/16/2016 CLINICAL DATA:   Intubation; OG tube placement; hx/o asthma, COPD, HTN, and pneumonia; smoker. EXAM: ABDOMEN - 1 VIEW COMPARISON:  CT, 10/05/2014 FINDINGS: Nasal/orogastric tube passes below the diaphragm. Tip projects in the proximal stomach. Normal bowel gas pattern. No evidence of obstruction. Right common iliac artery stent is noted. IMPRESSION: 1. Nasal/orogastric tube tip projects in the proximal stomach. Electronically Signed   By: Amie Portlandavid  Ormond M.D.   On: 10/16/2016 14:34   [place] Renal  Result Date: 10/17/2016 CLINICAL DATA:  Acute renal failure EXAM: RENAL / URINARY TRACT ULTRASOUND COMPLETE COMPARISON:  None. FINDINGS: Right Kidney: Length: 8.1 cm. Echogenicity within normal limits. No mass or hydronephrosis visualized. Left Kidney: Length: 8.6 cm. Echogenicity within normal limits. No mass or hydronephrosis visualized. Bladder: Decompressed with a Foley catheter. IMPRESSION: No cause for acute renal failure identified. Both kidneys are relatively small, measuring less than 9 cm. Electronically Signed   By: Gerome Samavid  Williams III M.D   On: 10/17/2016 14:12   Dg Chest Port 1 View  Result Date: 10/18/2016 CLINICAL DATA:  Acute respiratory failure. EXAM: PORTABLE CHEST 1 VIEW COMPARISON:  Radiographs of Oct 16, 2016. FINDINGS: The heart size and mediastinal contours are within normal limits. Both lungs are clear. Endotracheal and nasogastric tubes are unchanged in position. Left internal jugular catheter is unchanged. No pneumothorax or pleural effusion is noted. The visualized skeletal structures are unremarkable. IMPRESSION: Stable support apparatus. No acute cardiopulmonary abnormality seen. Electronically Signed   By: Lupita RaiderJames  Green Jr, M.D.   On: 10/18/2016 07:57   Dg Chest Port 1 View  Result Date: 10/16/2016 CLINICAL DATA:  Respiratory difficulty EXAM: PORTABLE CHEST 1 VIEW COMPARISON:  150 hours FINDINGS: Endotracheal tube placed. Tip is 5.8 cm from the carina. Left jugular central venous catheter place. Tip is  in the mid SVC. NG tube tip is beyond the gastroesophageal junction. Lungs are hyperaerated. Subcentimeter nodular density in the left mid lung zone is stable. It may represent a nipple shadow. No pneumothorax. No pleural effusion. Normal heart size. IMPRESSION: Endotracheal tube as described Left jugular central venous catheter tip in the SVC and no pneumothorax Left mid lung nodular density is stable. It may represent a nipple shadow. Attention on follow-up is recommended. Electronically Signed   By: Jolaine ClickArthur  Hoss M.D.   On: 10/16/2016 14:36   Dg Abd Portable 1v  Result Date: 10/16/2016 CLINICAL DATA:  NG tube placement EXAM: PORTABLE ABDOMEN - 1 VIEW COMPARISON:  10/16/2016 at 1417 hours FINDINGS: Enteric tube in the gastric body. Nonobstructive bowel gas pattern. Vascular stent in the right iliac. Lung bases are clear. IMPRESSION: Enteric tube in the gastric body. Electronically Signed   By: Charline BillsSriyesh  Krishnan M.D.   On: 10/16/2016 16:49    EKG:  Orders placed or performed during the hospital encounter of 10/15/16  . ED EKG  . ED EKG  . EKG 12-Lead  . EKG 12-Lead    ASSESSMENT AND PLAN:  Active Problems:   Acute on chronic respiratory failure with hypoxemia (HCC)   Protein-calorie malnutrition, severe  # Acute on chronic respiratory failure with hypoxia due to COPD exacerbation and  pneumonia Needing full ventilatory support Nebs IV antibiotics IV steroids  # Septic shock secondary to pneumonia Off pressors today and improving   # COPD exacerbation due to acute bronchitis, continue  patient on levofloxacin, Pulmicort, DuoNeb's, prednisone, Spiriva, follow clinically   # Acute kidney injury due to ATN from hypotension Improving slowly  # Tobacco abuse. Counseled  on admission   DRUG ALLERGIES:  Allergies  Allergen Reactions  . Aspirin Other (See Comments)    Unknown  . Celecoxib Other (See Comments)    Reaction: Unknown  . Nsaids Other (See Comments)    Reaction: Unknown     CODE STATUS:     Code Status Orders        Start     Ordered   10/15/16 0823  Full code  Continuous     10/15/16 0822    Code Status History    Date Active Date Inactive Code Status Order ID Comments User Context   10/04/2016  4:43 AM 10/05/2016  2:59 PM Full Code [id number]  Arnaldo Natal, MD Inpatient   05/27/2016  9:28 AM 05/27/2016  2:35 PM Full Code [id number]  Annice Needy, MD Inpatient   11/10/2011  9:36 AM 11/11/2011  4:58 PM Full Code [id number]  Christiane Ha, MD Inpatient      TOTAL TIME TAKING CARE OF THIS PATIENT:40 minutes.   Milagros Loll R M.D on 10/18/2016 at 12:04 PM  Between 7am to 6pm - Pager - [phone]  After 6pm go to [email or web address] - password [email or web address] [facility]  [facility]  Office  [phone]  CC: Primary care physician; Leanna Sato, MD

## 2016-10-18 NOTE — Progress Notes (Signed)
Patient's fentanyl was reduced 50% to 100 mc/min early AM, approx 0930 writer entered room to administer mouth care and 10 AM meds.  After mouth care patient suddenly opened his eyes very wide and sat up, began reaching for tubes, was trying to talk, and appeared very frightened and agitated, could not be calmed verbally. HR accelerated to 135.  100 mc fent bolus and 4 mg versed administered, sedation increased back to 100%, 200 mcg/min.  Patient is resting quietly at this time, HR < 100

## 2016-10-18 NOTE — Care Management (Signed)
RNCM spoke with patient's daughter Alvy BealLakisha [phone]. Patient lives alone however per [place] he has 3 children that share decisions when patient cannot make decisions. They have planned for patient to return to one of their homes at discharge. Alvy BealLakisha aware that an address will be needed if home health in medically necessary- she agrees.  Interested in SNF for rehab however she is not sure if patient will agree. Patient was driving and walking without assistance prior to this admission. PCP [facility]. Uses  their pharmacy without difficulty paying for medications. Alvy BealLakisha states that patient is faithful to follow through with appointments however "he continues to smoke".  Two daughters (per [place]) are nurses.  No history of home health services. No home O2. Has nebulizer machine. RNCM to continue to follow.

## 2016-10-19 DIAGNOSIS — R451 Restlessness and agitation: Secondary | ICD-10-CM

## 2016-10-19 DIAGNOSIS — J9622 Acute and chronic respiratory failure with hypercapnia: Secondary | ICD-10-CM

## 2016-10-19 DIAGNOSIS — G9340 Encephalopathy, unspecified: Secondary | ICD-10-CM

## 2016-10-19 LAB — BLOOD GAS, ARTERIAL
ACID-BASE EXCESS: 3.3 mmol/L — AB (ref 0.0–2.0)
Acid-Base Excess: 3.6 mmol/L — ABNORMAL HIGH (ref 0.0–2.0)
BICARBONATE: 30.9 mmol/L — AB (ref 20.0–28.0)
BICARBONATE: 30.8 mmol/L — AB (ref 20.0–28.0)
Delivery systems: POSITIVE
Expiratory PAP: 5
FIO2: 0.3
FIO2: 0.3
Inspiratory PAP: 12
O2 SAT: 96.1 %
O2 Saturation: 91.4 %
PATIENT TEMPERATURE: 37
PCO2 ART: 56 mmHg — AB (ref 32.0–48.0)
PEEP: 5 cmH2O
PO2 ART: 87 mmHg (ref 83.0–108.0)
Patient temperature: 37
RATE: 8 resp/min
RATE: 18 resp/min
VT: 500 mL
pCO2 arterial: 57 mmHg — ABNORMAL HIGH (ref 32.0–48.0)
pH, Arterial: 7.35 (ref 7.350–7.450)
pH, Arterial: 7.34 — ABNORMAL LOW (ref 7.350–7.450)
pO2, Arterial: 66 mmHg — ABNORMAL LOW (ref 83.0–108.0)

## 2016-10-19 LAB — BASIC METABOLIC PANEL
ANION GAP: 5 (ref 5–15)
BUN: 50 mg/dL — ABNORMAL HIGH (ref 6–20)
CO2: 33 mmol/L — ABNORMAL HIGH (ref 22–32)
Calcium: 8.8 mg/dL — ABNORMAL LOW (ref 8.9–10.3)
Chloride: 104 mmol/L (ref 101–111)
Creatinine, Ser: 1.26 mg/dL — ABNORMAL HIGH (ref 0.61–1.24)
GFR calc Af Amer: >60 mL/min (ref >60)
GFR, EST NON AFRICAN AMERICAN: 58 mL/min — AB (ref >60)
GLUCOSE: 160 mg/dL — AB (ref 65–99)
POTASSIUM: 4.8 mmol/L (ref 3.5–5.1)
Sodium: 142 mmol/L (ref 135–145)

## 2016-10-19 LAB — URINE CULTURE: Culture: NO GROWTH

## 2016-10-19 LAB — GLUCOSE, CAPILLARY
Glucose-Capillary: 112 mg/dL — ABNORMAL HIGH (ref 65–99)
Glucose-Capillary: 133 mg/dL — ABNORMAL HIGH (ref 65–99)
Glucose-Capillary: 138 mg/dL — ABNORMAL HIGH (ref 65–99)
Glucose-Capillary: 146 mg/dL — ABNORMAL HIGH (ref 65–99)
Glucose-Capillary: 149 mg/dL — ABNORMAL HIGH (ref 65–99)
Glucose-Capillary: 168 mg/dL — ABNORMAL HIGH (ref 65–99)
Glucose-Capillary: 194 mg/dL — ABNORMAL HIGH (ref 65–99)

## 2016-10-19 LAB — CBC
HEMATOCRIT: 37.3 % — AB (ref 40.0–52.0)
HEMOGLOBIN: 12.1 g/dL — AB (ref 13.0–18.0)
MCH: 27 pg (ref 26.0–34.0)
MCHC: 32.3 g/dL (ref 32.0–36.0)
MCV: 83.5 fL (ref 80.0–100.0)
Platelets: 164 10*3/uL (ref 150–440)
RBC: 4.46 MIL/uL (ref 4.40–5.90)
RDW: 15 % — ABNORMAL HIGH (ref 11.5–14.5)
WBC: 10.9 10*3/uL — ABNORMAL HIGH (ref 3.8–10.6)

## 2016-10-19 LAB — HEMOGLOBIN A1C
Hgb A1c MFr Bld: 6.1 % — ABNORMAL HIGH (ref 4.8–5.6)
Mean Plasma Glucose: 128 mg/dL

## 2016-10-19 MED ORDER — SODIUM CHLORIDE 0.9 % IV SOLN
0.4000 ug/kg/h | INTRAVENOUS | Status: DC
  Administered 2016-10-19: 0.4 ug/kg/h via INTRAVENOUS
  Filled 2016-10-19 (×2): qty 2

## 2016-10-19 MED ORDER — ENOXAPARIN SODIUM 40 MG/0.4ML ~~LOC~~ SOLN
40.0000 mg | SUBCUTANEOUS | Status: DC
  Administered 2016-10-19 – 2016-11-08 (×21): 40 mg via SUBCUTANEOUS
  Filled 2016-10-19 (×21): qty 0.4

## 2016-10-19 MED ORDER — FENTANYL BOLUS VIA INFUSION
50.0000 ug | INTRAVENOUS | Status: DC | PRN
  Administered 2016-10-19 – 2016-10-25 (×12): 50 ug via INTRAVENOUS
  Filled 2016-10-19: qty 50

## 2016-10-19 MED ORDER — SODIUM CHLORIDE 0.9 % IV SOLN
0.4000 ug/kg/h | INTRAVENOUS | Status: DC
  Administered 2016-10-19 (×2): 1.2 ug/kg/h via INTRAVENOUS
  Administered 2016-10-19: 0.8 ug/kg/h via INTRAVENOUS
  Administered 2016-10-20: 1.2 ug/kg/h via INTRAVENOUS
  Administered 2016-10-20: 1.1 ug/kg/h via INTRAVENOUS
  Administered 2016-10-20: 1.2 ug/kg/h via INTRAVENOUS
  Filled 2016-10-19 (×6): qty 2

## 2016-10-19 MED ORDER — DEXMEDETOMIDINE BOLUS VIA INFUSION
1.0000 ug/kg | Freq: Once | INTRAVENOUS | Status: DC
Start: 2016-10-19 — End: 2016-10-20
  Filled 2016-10-19: qty 56

## 2016-10-19 MED ORDER — DEXTROSE 5 % IV SOLN
1.0000 g | INTRAVENOUS | Status: DC
  Administered 2016-10-19 – 2016-10-21 (×3): 1 g via INTRAVENOUS
  Filled 2016-10-19 (×4): qty 10

## 2016-10-19 MED ORDER — DEXMEDETOMIDINE HCL IN NACL 200 MCG/50ML IV SOLN: 0.4000 ug/kg/h | INTRAVENOUS | Status: DC

## 2016-10-19 MED ORDER — SODIUM CHLORIDE 0.9 % IV SOLN: 0.4000 ug/kg/h | INTRAVENOUS | Status: DC

## 2016-10-19 MED ORDER — GABAPENTIN 250 MG/5ML PO SOLN
400.0000 mg | Freq: Three times a day (TID) | ORAL | Status: DC
  Administered 2016-10-19 – 2016-10-31 (×32): 400 mg
  Filled 2016-10-19 (×44): qty 8

## 2016-10-19 NOTE — Progress Notes (Signed)
[facility]  ROUNDING NOTE   Subjective:   UOP 1630  Creatinine 1.26 (2.1)  PRVC 50%  Objective:  Vital signs in last 24 hours:  Temp:  [97.8 F (36.6 C)-99.3 F (37.4 C)] 99.3 F (37.4 C) (05/22 0811) Pulse Rate:  [78-112] 98 (05/22 0811) Resp:  [7-16] 14 (05/22 0811) BP: (92-180)/(63-114) 109/72 (05/22 0811) SpO2:  [90 %-95 %] 94 % (05/22 0811) FiO2 (%):  [30 %-50 %] 50 % (05/22 0811) Weight:  [55.1 kg (121 lb 7.6 oz)] 55.1 kg (121 lb 7.6 oz) (05/22 0500)  Weight change: 0.7 kg (1 lb 8.7 oz) Filed Weights   10/17/16 0447 10/18/16 0429 10/19/16 0500  Weight: 52.2 kg (115 lb 1.3 oz) 54.4 kg (119 lb 14.9 oz) 55.1 kg (121 lb 7.6 oz)    Intake/Output: I/O last 3 completed shifts: In: 2883.1 [I.V.:784.6; NG/GT:2048.6; IV Piggyback:50] Out: 2380 [Urine:2380]   Intake/Output this shift:  Total I/O In: 225.2 [I.V.:80.4; NG/GT:144.8] Out: 300 [Urine:300]  Physical Exam: General: Critically ill  Head: +ETT  Eyes: Anicteric, eyes closed  Neck: Supple, trachea midline  Lungs:  Clear to auscultation, PRVC FiO2 50%  Heart: Regular rate and rhythm  Abdomen:  Soft, nontender, scaphoid  Extremities: no peripheral edema.  Neurologic: Intubated, sedated  Skin: No lesions  GU: Foley with yellow urine    Basic Metabolic Panel:  Recent Labs Lab 10/15/16 0433 10/16/16 0422 10/16/16 1801 10/17/16 0450 10/17/16 1718 10/18/16 0659 10/19/16 0454  NA 138 141  --  137  --  138 142  K 4.1 5.2*  --  5.4*  --  4.9 4.8  CL 102 98*  --  99*  --  104 104  CO2 26 33*  --  28  --  29 33*  GLUCOSE 143* 140*  --  137*  --  141* 160*  BUN 16 24*  --  59*  --  81* 50*  CREATININE 1.11 1.33*  --  2.28*  --  2.10* 1.26*  CALCIUM 8.6* 9.1  --  8.0*  --  8.2* 8.8*  MG  --   --  2.5* 2.4 2.6* 2.5*  --   PHOS  --   --  4.5 5.4* 6.2* 4.5  --     Liver Function Tests:  Recent Labs Lab 10/15/16 0433 10/16/16 0422  AST 26 32  ALT 14* 16*  ALKPHOS 75 83  BILITOT 0.5  0.5  PROT 7.9 8.7*  ALBUMIN 3.7 4.0   No results for input(s): LIPASE, AMYLASE in the last 168 hours. No results for input(s): AMMONIA in the last 168 hours.  CBC:  Recent Labs Lab 10/15/16 0433 10/16/16 0503 10/18/16 0659 10/19/16 0454  WBC 6.4 17.1* 9.1 10.9*  NEUTROABS 4.4  --   --   --   HGB 15.5 15.6 12.8* 12.1*  HCT 45.9 46.7 39.0* 37.3*  MCV 83.1 82.8 84.8 83.5  PLT 194 212 151 164    Cardiac Enzymes:  Recent Labs Lab 10/15/16 0433  TROPONINI <0.03    BNP: Invalid input(s): POCBNP  CBG:  Recent Labs Lab 10/18/16 1558 10/18/16 2005 10/19/16 0009 10/19/16 0405 10/19/16 0804  GLUCAP 131* 178* 194* 138* 44*    Microbiology: Results for orders placed or performed during the hospital encounter of 10/15/16  MRSA PCR Screening     Status: None   Collection Time: 10/16/16  4:42 AM  Result Value Ref Range Status   MRSA by PCR NEGATIVE NEGATIVE Final  Comment:        The GeneXpert MRSA Assay (FDA approved for NASAL specimens only), is one component of a comprehensive MRSA colonization surveillance program. It is not intended to diagnose MRSA infection nor to guide or monitor treatment for MRSA infections.     Coagulation Studies: No results for input(s): LABPROT, INR in the last 72 hours.  Urinalysis:  Recent Labs  10/17/16 1241  COLORURINE RED*  LABSPEC 1.016  PHURINE 5.0  GLUCOSEU NEGATIVE  HGBUR MODERATE*  BILIRUBINUR NEGATIVE  KETONESUR NEGATIVE  PROTEINUR 100*  NITRITE NEGATIVE  LEUKOCYTESUR SMALL*      Imaging: US Renal  Result Date: 10/17/2016 CLINICAL DATA:  Acute renal failure EXAM: RENAL / URINARY TRACT ULTRASOUND COMPLETE COMPARISON:  None. FINDINGS: Right Kidney: Length: 8.1 cm. Echogenicity within normal limits. No mass or hydronephrosis visualized. Left Kidney: Length: 8.6 cm. Echogenicity within normal limits. No mass or hydronephrosis visualized. Bladder: Decompressed with a Foley catheter. IMPRESSION: No cause  for acute renal failure identified. Both kidneys are relatively small, measuring less than 9 cm. Electronically Signed   By: Dorise Bullion III M.D   On: 10/17/2016 14:12   Dg Chest Port 1 View  Result Date: 10/18/2016 CLINICAL DATA:  Acute respiratory failure. EXAM: PORTABLE CHEST 1 VIEW COMPARISON:  Radiographs of Oct 16, 2016. FINDINGS: The heart size and mediastinal contours are within normal limits. Both lungs are clear. Endotracheal and nasogastric tubes are unchanged in position. Left internal jugular catheter is unchanged. No pneumothorax or pleural effusion is noted. The visualized skeletal structures are unremarkable. IMPRESSION: Stable support apparatus. No acute cardiopulmonary abnormality seen. Electronically Signed   By: Marijo Conception, M.D.   On: 10/18/2016 07:57     Medications:   . feeding supplement (VITAL 1.5 CAL) 1,000 mL (10/19/16 0447)  . fentaNYL infusion INTRAVENOUS 250 mcg/hr (10/19/16 0447)  . levofloxacin (LEVAQUIN) IV 250 mg (10/18/16 1711)   . budesonide (PULMICORT) nebulizer solution  0.25 mg Nebulization Q6H  . chlorhexidine gluconate (MEDLINE KIT)  15 mL Mouth Rinse BID  . cholecalciferol  1,000 Units Oral Daily  . docusate  100 mg Per Tube BID  . enoxaparin (LOVENOX) injection  30 mg Subcutaneous Q24H  . famotidine  20 mg Per Tube Daily  . ferrous gluconate  324 mg Oral Daily  . gabapentin  900 mg Per Tube TID  . insulin aspart  0-15 Units Subcutaneous Q4H  . ipratropium-albuterol  3 mL Nebulization Q6H  . mouth rinse  15 mL Mouth Rinse 10 times per day  . methylPREDNISolone (SOLU-MEDROL) injection  40 mg Intravenous Q12H  . multivitamin  15 mL Per Tube Daily   acetaminophen **OR** [DISCONTINUED] acetaminophen, albuterol, fentaNYL, hydrALAZINE, methocarbamol, metoprolol tartrate, midazolam, [DISCONTINUED] ondansetron **OR** ondansetron (ZOFRAN) IV, sennosides  Assessment/ Plan:  [name] is a 66 y.o. black male Asthma/COPD, hypertension,  prior history of pneumonia, who was admitted to [facility] on 10/15/2016 for evaluation of increasing shortness of breath. COPD exacerbation (HCC) [J44.1] Acute on chronic respiratory failure with hypoxemia (HCC) [J96.21]  1. Acute renal failure with hyperkalemia secondary to hypotension.  Baseline creatinine is 0.9. Potassium now at goal. Ultrasound showing small kidneys.  Nonoliguric urine output.  Creatinine improving.   2. Acute respiratory failure: acute exacerbation of COPD/asthma. Intubated. Difficult to wean.  - solumedrol - mechanical ventilation - Appreciate pulmonary input.   3. Hypotension: blood pressure improved. Off vasopressors   LOS: 4 Kevin Holden 5/22/20189:40 AM

## 2016-10-19 NOTE — Plan of Care (Signed)
Problem: Skin Integrity Impairment Risk: Goal: Risk for impaired skin integrity will decrease Outcome: Progressing Patient has been turned every two hours to prevent skin breakdown.

## 2016-10-19 NOTE — Progress Notes (Signed)
Anticoagulation monitoring:   66 yo male ordered enoxaparin 30mg  SQ Q24hr. In setting of improved renal function, will transition patient to enoxaparin 40mg  SQ Q24hr.   Pharmacy will continue to monitor and adjust per protocol.   MLS  [id number]

## 2016-10-19 NOTE — Progress Notes (Signed)
PULMONARY / CRITICAL CARE MEDICINE   Name: Kevin Holden MRN: [id number] DOB: 12-15-1950    ADMISSION DATE:  10/15/2016    PT PROFILE:   66 y.o. male smoker intubated after failing BiPAP with diagnosis of AECOPD  MAJOR EVENTS/TEST RESULTS: 05/18 Admitted by Hospitalist service with acute on chronic respiratory failure deemed due to AECOPD 05/18 Echo:The estimated ejection   fraction was in the range of 55% to 60%. Wall motion was normal. Doppler parameters are consistent with abnormal left ventricular relaxation (grade 1 diastolic dysfunction).  05/19 Transferred to ICU/SDU for BiPAP. Failed BiPAP and intubated 05/20 Renal US: No cause for acute renal failure identified. Both kidneys are relatively small, measuring less than 9 cm  05/21 very agitated on WUA. No weaning 05/22 Very agitated on WUA. Dexmedetomidine initiated  INDWELLING DEVICES:: ETT 05/19 >>  L IJ CVL 05/19 >>   MICRO DATA: MRSA PCR 05/19 >> NEG Urine 05/20 >> NEG   ANTIMICROBIALS:  Levofloxacin 05/19 >> 05/22 Ceftriaxone 05/22 >>    SUBJ: RASS -3, -4 on fentanyl infusion. Very agitated on WUA. Not F/C  VITAL SIGNS: BP 113/74   Pulse 81   Temp 99.3 F (37.4 C) (Oral)   Resp 11   Ht 6' (1.829 m)   Wt 121 lb 7.6 oz (55.1 kg)   SpO2 96%   BMI 16.47 kg/m   HEMODYNAMICS:    VENTILATOR SETTINGS: Vent Mode: PRVC FiO2 (%):  [30 %-50 %] 50 % Set Rate:  [14 bmp] 14 bmp Vt Set:  [500 mL-600 mL] 500 mL PEEP:  [5 cmH20] 5 cmH20 Plateau Pressure:  [25 cmH20-26 cmH20] 26 cmH20  INTAKE / OUTPUT: I/O last 3 completed shifts: In: 2883.1 [I.V.:784.6; NG/GT:2048.6; IV Piggyback:50] Out: 2380 [Urine:2380]  PHYSICAL EXAMINATION:  Gen: Thin, intubated, RASS -4, not F/C HEENT: NCAT, sclerae white Neck: NO LAN, no JVD noted Lungs: distant BS with prolonged exp phase, no wheezes Cardiovascular: normal rate, regular rhythm, no M noted Abdomen: Soft, NT, +BS Ext: no C/C/E Neuro: PERRL, EOMI, MAEs Skin: No  lesions noted   LABS:  BMET  Recent Labs Lab 10/17/16 0450 10/18/16 0659 10/19/16 0454  NA 137 138 142  K 5.4* 4.9 4.8  CL 99* 104 104  CO2 28 29 33*  BUN 59* 81* 50*  CREATININE 2.28* 2.10* 1.26*  GLUCOSE 137* 141* 160*    Electrolytes  Recent Labs Lab 10/17/16 0450 10/17/16 1718 10/18/16 0659 10/19/16 0454  CALCIUM 8.0*  --  8.2* 8.8*  MG 2.4 2.6* 2.5*  --   PHOS 5.4* 6.2* 4.5  --     CBC  Recent Labs Lab 10/16/16 0503 10/18/16 0659 10/19/16 0454  WBC 17.1* 9.1 10.9*  HGB 15.6 12.8* 12.1*  HCT 46.7 39.0* 37.3*  PLT 212 151 164    ABG  Recent Labs Lab 10/16/16 1000 10/16/16 1400 10/17/16 0410  PHART 7.35 7.34* 7.29*  PCO2ART 56* 57* 61*  PO2ART 87 66* 99    CXR: NNF  ASSESSMENT / PLAN:  PULMONARY A: Acute on chronic hypercarbic respiratory failure 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 P:   Cont vent support - settings reviewed and/or adjusted Cont vent bundle Daily SBT if/when meets criteria Cont nebulized steroids and bronchodilators Cont systemic steroids   CARDIOVASCULAR A:  Hypertension, controlled P:  Continue current treatment  RENAL A:   AKI, resolved CKD P:   Monitor BMET intermittently Monitor I/Os Correct electrolytes as indicated Discussed with Dr Wynelle LinkKolluru  GASTROINTESTINAL A:   No issues  P:   SUP: enteral famotidine Cont TF protocol  HEMATOLOGIC A:   MIld anemia without acute blood loss P:  DVT px: enoxaparin Monitor CBC intermittently Transfuse per usual guidelines   INFECTIOUS A:   Acute bronchitis P:   Monitor temp, WBC count Micro and abx as above   ENDOCRINE A:   DM 2 Steroid induced hyperglycemia P:   Cont SSI protocol  NEUROLOGIC A:   ICU/vent associated discomfort Acute encephalopathy P:   RASS goal: -1, -2 Cont PAD protocol Dex initiated 05/22  Daughter and niece updated @ bedside @ bedside  CCM time: 35 mins The above time includes time spent in consultation with  patient and/or family members and reviewing care plan on multidisciplinary rounds  Billy Fischer, MD PCCM service Mobile [phone] Pager [phone] 10/19/2016 1:20 PM

## 2016-10-19 NOTE — Progress Notes (Signed)
[facility] [place] [facility]   PATIENT NAME: Kevin Holden    MR#:  [id number]  DATE OF BIRTH:  11/11/1950   On 40 % O2 - PRVC. Off pressors. Improving UOP   CHIEF COMPLAINT:   Chief Complaint  Patient presents with  . Shortness of Breath  Review of Systems  Unable to perform ROS: Intubated  Constitutional: Negative for chills, fever and weight loss.  HENT: Negative for congestion.   Eyes: Negative for blurred vision and double vision.  Respiratory: Positive for cough, sputum production, shortness of breath and wheezing.   Cardiovascular: Positive for leg swelling. Negative for chest pain, palpitations, orthopnea and PND.  Gastrointestinal: Negative for abdominal pain, blood in stool, constipation, diarrhea, nausea and vomiting.  Genitourinary: Negative for dysuria, frequency, hematuria and urgency.  Musculoskeletal: Negative for falls.  Neurological: Negative for dizziness, tremors, focal weakness and headaches.  Endo/Heme/Allergies: Does not bruise/bleed easily.  Psychiatric/Behavioral: Negative for depression. The patient does not have insomnia.     VITAL SIGNS: Blood pressure 113/74, pulse 81, temperature 98.5 F (36.9 C), temperature source Oral, resp. rate 11, height 6' (1.829 m), weight 55.1 kg (121 lb 7.6 oz), SpO2 96 %.  PHYSICAL EXAMINATION:   GENERAL:  66 y.o.-year-old patient currently intubated, sedated.  Eyes: Pupils equal, round, reactive to light  HEENT: Head atraumatic, normocephalic. no jugular venous distention. No thyroid enlargement, no tenderness. ETT Lungs: Patient has bilateral expiratory wheeze in all lung fields, no crackles.  CARDIOVASCULAR: S1, S2 normal. No murmurs, rubs, or gallops.  ABDOMEN: Soft, , nondistended. Bowel sounds present. No organomegaly or mass.  EXTREMITIES: . Trace lower extremity and pedal edema, No cyanosis, or clubbing.  NEUROLOGIC: intubated , sedated.  SKIN: No obvious rash, lesion, or  ulcer.   RIJ TL  ORDERS/RESULTS REVIEWED:   CBC  Recent Labs Lab 10/15/16 0433 10/16/16 0503 10/18/16 0659 10/19/16 0454  WBC 6.4 17.1* 9.1 10.9*  HGB 15.5 15.6 12.8* 12.1*  HCT 45.9 46.7 39.0* 37.3*  PLT 194 212 151 164  MCV 83.1 82.8 84.8 83.5  MCH 28.0 27.6 27.9 27.0  MCHC 33.7 33.4 32.9 32.3  RDW 15.3* 15.3* 15.3* 15.0*  LYMPHSABS 1.4  --   --   --   MONOABS 0.6  --   --   --   EOSABS 0.0  --   --   --   BASOSABS 0.0  --   --   --    ------------------------------------------------------------------------------------------------------------------  Chemistries   Recent Labs Lab 10/15/16 0433 10/16/16 0422 10/16/16 1801 10/17/16 0450 10/17/16 1718 10/18/16 0659 10/19/16 0454  NA 138 141  --  137  --  138 142  K 4.1 5.2*  --  5.4*  --  4.9 4.8  CL 102 98*  --  99*  --  104 104  CO2 26 33*  --  28  --  29 33*  GLUCOSE 143* 140*  --  137*  --  141* 160*  BUN 16 24*  --  59*  --  81* 50*  CREATININE 1.11 1.33*  --  2.28*  --  2.10* 1.26*  CALCIUM 8.6* 9.1  --  8.0*  --  8.2* 8.8*  MG  --   --  2.5* 2.4 2.6* 2.5*  --   AST 26 32  --   --   --   --   --   ALT 14* 16*  --   --   --   --   --  ALKPHOS 75 83  --   --   --   --   --   BILITOT 0.5 0.5  --   --   --   --   --    ------------------------------------------------------------------------------------------------------------------ estimated creatinine clearance is 45.6 mL/min (A) (by C-G formula based on SCr of 1.26 mg/dL (H)). ------------------------------------------------------------------------------------------------------------------ No results for input(s): TSH, T4TOTAL, T3FREE, THYROIDAB in the last 72 hours.  Invalid input(s): FREET3  Cardiac Enzymes  Recent Labs Lab 10/15/16 0433  TROPONINI <0.03   ------------------------------------------------------------------------------------------------------------------ Invalid input(s):  POCBNP ---------------------------------------------------------------------------------------------------------------  RADIOLOGY: US Renal  Result Date: 10/17/2016 CLINICAL DATA:  Acute renal failure EXAM: RENAL / URINARY TRACT ULTRASOUND COMPLETE COMPARISON:  None. FINDINGS: Right Kidney: Length: 8.1 cm. Echogenicity within normal limits. No mass or hydronephrosis visualized. Left Kidney: Length: 8.6 cm. Echogenicity within normal limits. No mass or hydronephrosis visualized. Bladder: Decompressed with a Foley catheter. IMPRESSION: No cause for acute renal failure identified. Both kidneys are relatively small, measuring less than 9 cm. Electronically Signed   By: Gerome Sam III M.D   On: 10/17/2016 14:12   Dg Chest Port 1 View  Result Date: 10/18/2016 CLINICAL DATA:  Acute respiratory failure. EXAM: PORTABLE CHEST 1 VIEW COMPARISON:  Radiographs of Oct 16, 2016. FINDINGS: The heart size and mediastinal contours are within normal limits. Both lungs are clear. Endotracheal and nasogastric tubes are unchanged in position. Left internal jugular catheter is unchanged. No pneumothorax or pleural effusion is noted. The visualized skeletal structures are unremarkable. IMPRESSION: Stable support apparatus. No acute cardiopulmonary abnormality seen. Electronically Signed   By: Lupita Raider, M.D.   On: 10/18/2016 07:57    EKG:  Orders placed or performed during the hospital encounter of 10/15/16  . ED EKG  . ED EKG  . EKG 12-Lead  . EKG 12-Lead    ASSESSMENT AND PLAN:  Active Problems:   Acute on chronic respiratory failure with hypoxemia (HCC)   Protein-calorie malnutrition, severe  # Acute on chronic respiratory failure with hypoxia due to COPD exacerbation and pneumonia Still needing full ventilatory support - Sedated Nebs IV antibiotics IV steroids  # Septic shock secondary to pneumonia Off pressors. Resolved   # COPD exacerbation due to acute bronchitis, continue  patient on  levofloxacin, Pulmicort, DuoNeb's, prednisone, Spiriva, follow clinically   # Acute kidney injury due to ATN from hypotension Improving slowly  # Tobacco abuse. Counseled  on admission  DRUG ALLERGIES:  Allergies  Allergen Reactions  . Aspirin Other (See Comments)    Unknown  . Celecoxib Other (See Comments)    Reaction: Unknown  . Nsaids Other (See Comments)    Reaction: Unknown    CODE STATUS:     Code Status Orders        Start     Ordered   10/15/16 0823  Full code  Continuous     10/15/16 0822    Code Status History    Date Active Date Inactive Code Status Order ID Comments User Context   10/04/2016  4:43 AM 10/05/2016  2:59 PM Full Code [id number]  Arnaldo Natal, MD Inpatient   05/27/2016  9:28 AM 05/27/2016  2:35 PM Full Code [id number]  Annice Needy, MD Inpatient   11/10/2011  9:36 AM 11/11/2011  4:58 PM Full Code [id number]  Christiane Ha, MD Inpatient      TOTAL TIME TAKING CARE OF THIS PATIENT25 minutes.   Milagros Loll R M.D on 10/19/2016 at 1:22 PM  Between 7am  to 6pm - Pager - [phone]  After 6pm go to [email or web address] - password [email or web address] [facility]  [place] Hospitalists Office  [phone]  CC: Primary care physician; Leanna Sato, MD

## 2016-10-20 ENCOUNTER — Inpatient Hospital Stay: Payer: Medicare Other

## 2016-10-20 LAB — CBC
HEMATOCRIT: 36.7 % — AB (ref 40.0–52.0)
Hemoglobin: 11.9 g/dL — ABNORMAL LOW (ref 13.0–18.0)
MCH: 27 pg (ref 26.0–34.0)
MCHC: 32.5 g/dL (ref 32.0–36.0)
MCV: 83.1 fL (ref 80.0–100.0)
Platelets: 170 10*3/uL (ref 150–440)
RBC: 4.42 MIL/uL (ref 4.40–5.90)
RDW: 15 % — AB (ref 11.5–14.5)
WBC: 8 10*3/uL (ref 3.8–10.6)

## 2016-10-20 LAB — GLUCOSE, CAPILLARY
Glucose-Capillary: 229 mg/dL — ABNORMAL HIGH (ref 65–99)
Glucose-Capillary: 161 mg/dL — ABNORMAL HIGH (ref 65–99)
Glucose-Capillary: 188 mg/dL — ABNORMAL HIGH (ref 65–99)
Glucose-Capillary: 162 mg/dL — ABNORMAL HIGH (ref 65–99)
Glucose-Capillary: 140 mg/dL — ABNORMAL HIGH (ref 65–99)
Glucose-Capillary: 150 mg/dL — ABNORMAL HIGH (ref 65–99)

## 2016-10-20 LAB — BASIC METABOLIC PANEL
ANION GAP: 5 (ref 5–15)
BUN: 30 mg/dL — AB (ref 6–20)
CO2: 37 mmol/L — ABNORMAL HIGH (ref 22–32)
Calcium: 8.6 mg/dL — ABNORMAL LOW (ref 8.9–10.3)
Chloride: 101 mmol/L (ref 101–111)
Creatinine, Ser: 0.82 mg/dL (ref 0.61–1.24)
Glucose, Bld: 226 mg/dL — ABNORMAL HIGH (ref 65–99)
POTASSIUM: 4.5 mmol/L (ref 3.5–5.1)
SODIUM: 143 mmol/L (ref 135–145)

## 2016-10-20 LAB — MAGNESIUM: Magnesium: 2.1 mg/dL (ref 1.7–2.4)

## 2016-10-20 LAB — PHOSPHORUS: PHOSPHORUS: 2.4 mg/dL — AB (ref 2.5–4.6)

## 2016-10-20 LAB — TRIGLYCERIDES: Triglycerides: 84 mg/dL (ref <150)

## 2016-10-20 MED ORDER — VITAL 1.5 CAL PO LIQD
1000.0000 mL | ORAL | Status: AC
  Administered 2016-10-20 – 2016-10-27 (×8): 1000 mL

## 2016-10-20 MED ORDER — SODIUM CHLORIDE 0.9% FLUSH
10.0000 mL | INTRAVENOUS | Status: DC | PRN
Start: 2016-10-20 — End: 2016-11-02

## 2016-10-20 MED ORDER — PROPOFOL 1000 MG/100ML IV EMUL
5.0000 ug/kg/min | INTRAVENOUS | Status: DC
  Administered 2016-10-20: 35 ug/kg/min via INTRAVENOUS
  Administered 2016-10-20: 25 ug/kg/min via INTRAVENOUS
  Administered 2016-10-20: 5 ug/kg/min via INTRAVENOUS
  Administered 2016-10-21: 25 ug/kg/min via INTRAVENOUS
  Administered 2016-10-21 – 2016-10-26 (×13): 30 ug/kg/min via INTRAVENOUS
  Administered 2016-10-27: 10 ug/kg/min via INTRAVENOUS
  Administered 2016-10-27: 20 ug/kg/min via INTRAVENOUS
  Administered 2016-10-28 – 2016-10-29 (×3): 10 ug/kg/min via INTRAVENOUS
  Filled 2016-10-20 (×31): qty 100

## 2016-10-20 MED ORDER — SODIUM CHLORIDE 0.9% FLUSH
10.0000 mL | Freq: Two times a day (BID) | INTRAVENOUS | Status: DC
  Administered 2016-10-20: 20 mL
  Administered 2016-10-20 – 2016-10-22 (×4): 10 mL
  Administered 2016-10-22: 20 mL
  Administered 2016-10-23: 10 mL
  Administered 2016-10-23: 20 mL
  Administered 2016-10-24: 10 mL
  Administered 2016-10-24: 20 mL
  Administered 2016-10-25 – 2016-11-01 (×15): 10 mL

## 2016-10-20 NOTE — Plan of Care (Signed)
Problem: Bowel/Gastric: Goal: Will not experience complications related to bowel motility Outcome: Not Progressing Bowel sounds active but no bowel movement charted since 10/17/16. Patient given both scheduled colace and prn senna.  Problem: Activity: Goal: Ability to tolerate increased activity will improve Outcome: Progressing Around midday patient able to actively participate in ROM with RN but only for upper extremities. Limited ROM in bilateral knees.  Problem: Respiratory: Goal: Ability to maintain a clear airway and adequate ventilation will improve Outcome: Progressing This morning patient very agitated, with ventilator dyssynchrony and frequent movements in bed, not tracking RN and not able to follow commands. Calmed with increased continuous sedation and prn medication. Patient lungs at this time had intense wheezing and rhonchi. MD changed sedation orders and after patient started on propofol and precedex stopped, wheezes dramatically decreased and patient able to follow commands and nod/shake head appropriately to questions. Tolerated two hours in pressure support ventilation. Patient still had moments of agitation and restlessness throughout shift which required sedation titration and prn medication administration.

## 2016-10-20 NOTE — Progress Notes (Signed)
Magdalene, NP was made aware of patient's new onset of A-flutter. EKG order was put in and morning lab of Magnesium and phosphorus was order. Will continue to monitor patient.

## 2016-10-20 NOTE — Progress Notes (Signed)
Nutrition Follow-up  DOCUMENTATION CODES:   Severe malnutrition in context of chronic illness  INTERVENTION:  Decrease Vital 1.5 to 3340mL/hr - should help with vent weaning  NUTRITION DIAGNOSIS:   Malnutrition related to catabolic illness as evidenced by severe depletion of muscle mass, severe depletion of body fat. -ongoing  GOAL:   Patient will meet greater than or equal to 90% of their needs -meeting with TF's at goal  MONITOR:   PO intake, Supplement acceptance, Labs, Weight trends  REASON FOR ASSESSMENT:   Other (Comment) (low BMI)    ASSESSMENT:   66 year old male with h/o HTN and COPD admitted for acute on chronic respiratory failure with hypoxemia.  Patient is currently intubated on ventilator support MV: 6.5 L/min Temp (24hrs), Avg:97.9 F (36.6 C), Min:97.5 F (36.4 C), Max:98.5 F (36.9 C) Propofol: 2.3 ml/hr --> 61 calories Down 2# since admission. Needs re-estimated Labs and medications reviewed: CBG 188, Phos 2.4  Diet Order:     Skin:  Reviewed, no issues  Last BM:  5/17  Height:   Ht Readings from Last 1 Encounters:  10/16/16 6' (1.829 m)    Weight:   Wt Readings from Last 1 Encounters:  10/20/16 121 lb 14.6 oz (55.3 kg)    Ideal Body Weight:  80.9 kg  BMI:  Body mass index is 16.53 kg/m.  Estimated Nutritional Needs:   Kcal:  1477  Protein:  66-82 gm  Fluid:  Per MD/NP/PA  EDUCATION NEEDS:   Education needs no appropriate at this time  Dionne AnoWilliam M. Dacoda Spallone, MS, RD LDN Inpatient Clinical Dietitian Pager [phone]

## 2016-10-20 NOTE — Progress Notes (Signed)
[facility] - [place] at [facility]   PATIENT NAME: Kevin Holden    MR#:  [id number]  DATE OF BIRTH:  June 14, 1950   On PS. 40% o2. Unable to tolerate sedation being weaned off   CHIEF COMPLAINT:   Chief Complaint  Patient presents with  . Shortness of Breath  Review of Systems  Unable to perform ROS: Intubated    VITAL SIGNS: Blood pressure 126/70, pulse 65, temperature 98 F (36.7 C), temperature source Oral, resp. rate (!) 9, height 6' (1.829 m), weight 55.3 kg (121 lb 14.6 oz), SpO2 95 %.  PHYSICAL EXAMINATION:   GENERAL:  66 y.o.-year-old patient currently intubated, sedated.  Eyes: Pupils equal, round, reactive to light  HEENT: Head atraumatic, normocephalic. no jugular venous distention. No thyroid enlargement, no tenderness. ETT Lungs: Patient has bilateral expiratory wheeze in all lung fields, no crackles.  CARDIOVASCULAR: S1, S2 normal. No murmurs, rubs, or gallops.  ABDOMEN: Soft, , nondistended. Bowel sounds present. No organomegaly or mass.  EXTREMITIES: . Trace lower extremity and pedal edema, No cyanosis, or clubbing.  NEUROLOGIC: intubated , sedated.  SKIN: No obvious rash, lesion, or ulcer.   RIJ TL  ORDERS/RESULTS REVIEWED:   CBC  Recent Labs Lab 10/15/16 0433 10/16/16 0503 10/18/16 0659 10/19/16 0454 10/20/16 0405  WBC 6.4 17.1* 9.1 10.9* 8.0  HGB 15.5 15.6 12.8* 12.1* 11.9*  HCT 45.9 46.7 39.0* 37.3* 36.7*  PLT 194 212 151 164 170  MCV 83.1 82.8 84.8 83.5 83.1  MCH 28.0 27.6 27.9 27.0 27.0  MCHC 33.7 33.4 32.9 32.3 32.5  RDW 15.3* 15.3* 15.3* 15.0* 15.0*  LYMPHSABS 1.4  --   --   --   --   MONOABS 0.6  --   --   --   --   EOSABS 0.0  --   --   --   --   BASOSABS 0.0  --   --   --   --    ------------------------------------------------------------------------------------------------------------------  Chemistries   Recent Labs Lab 10/15/16 0433 10/16/16 0422 10/16/16 1801 10/17/16 0450 10/17/16 1718  10/18/16 0659 10/19/16 0454 10/20/16 0405  NA 138 141  --  137  --  138 142 143  K 4.1 5.2*  --  5.4*  --  4.9 4.8 4.5  CL 102 98*  --  99*  --  104 104 101  CO2 26 33*  --  28  --  29 33* 37*  GLUCOSE 143* 140*  --  137*  --  141* 160* 226*  BUN 16 24*  --  59*  --  81* 50* 30*  CREATININE 1.11 1.33*  --  2.28*  --  2.10* 1.26* 0.82  CALCIUM 8.6* 9.1  --  8.0*  --  8.2* 8.8* 8.6*  MG  --   --  2.5* 2.4 2.6* 2.5*  --  2.1  AST 26 32  --   --   --   --   --   --   ALT 14* 16*  --   --   --   --   --   --   ALKPHOS 75 83  --   --   --   --   --   --   BILITOT 0.5 0.5  --   --   --   --   --   --    ------------------------------------------------------------------------------------------------------------------ estimated creatinine clearance is 70.2 mL/min (by C-G formula based on SCr of 0.82  mg/dL). ------------------------------------------------------------------------------------------------------------------ No results for input(s): TSH, T4TOTAL, T3FREE, THYROIDAB in the last 72 hours.  Invalid input(s): FREET3  Cardiac Enzymes  Recent Labs Lab 10/15/16 0433  TROPONINI <0.03   ------------------------------------------------------------------------------------------------------------------ Invalid input(s): POCBNP ---------------------------------------------------------------------------------------------------------------  RADIOLOGY: Dg Chest Port 1 View  Result Date: 10/20/2016 CLINICAL DATA:  Respiratory failure. EXAM: PORTABLE CHEST 1 VIEW COMPARISON:  10/18/2016 .  10/04/2016.  10/05/2014. FINDINGS: Endotracheal tube, NG tube, left IJ line stable position. Heart size normal. Stable nipple shadow again noted on the left. Lungs are clear. No pleural effusion or pneumothorax. Cervical spine fusion. IMPRESSION: 1. Lines and tubes in stable position. 2.  No acute cardiopulmonary disease. Electronically Signed   By: Maisie Fus  Register   On: 10/20/2016 06:33    EKG:  Orders  placed or performed during the hospital encounter of 10/15/16  . ED EKG  . ED EKG  . EKG 12-Lead  . EKG 12-Lead  . EKG 12-Lead  . EKG 12-Lead    ASSESSMENT AND PLAN:  Active Problems:   Acute on chronic respiratory failure with hypoxemia (HCC)   Protein-calorie malnutrition, severe  # Acute on chronic respiratory failure with hypoxia due to COPD exacerbation and pneumonia Still needing full ventilatory support - Sedated Nebs IV antibiotics IV steroids On fentanyl and precdex  # Septic shock secondary to pneumonia Off pressors. Resolved   # COPD exacerbation due to acute bronchitis, continue  patient on levofloxacin, Pulmicort, DuoNeb's, prednisone, Spiriva, follow clinically   # Acute kidney injury due to ATN from hypotension Improving slowly  # Tobacco abuse. Counseled  on admission  DRUG ALLERGIES:  Allergies  Allergen Reactions  . Aspirin Other (See Comments)    Unknown  . Celecoxib Other (See Comments)    Reaction: Unknown  . Nsaids Other (See Comments)    Reaction: Unknown    CODE STATUS:     Code Status Orders        Start     Ordered   10/15/16 0823  Full code  Continuous     10/15/16 0822    Code Status History    Date Active Date Inactive Code Status Order ID Comments User Context   10/04/2016  4:43 AM 10/05/2016  2:59 PM Full Code [id number]  Arnaldo Natal, MD Inpatient   05/27/2016  9:28 AM 05/27/2016  2:35 PM Full Code [id number]  Annice Needy, MD Inpatient   11/10/2011  9:36 AM 11/11/2011  4:58 PM Full Code [id number]  Christiane Ha, MD Inpatient      TOTAL TIME TAKING CARE OF THIS PATIENT - 25 minutes.   Milagros Loll R M.D on 10/20/2016 at 11:29 AM  Between 7am to 6pm - Pager - [phone]  After 6pm go to [email or web address] - password [email or web address] [facility]  [facility] Office  [phone]  CC: Primary care physician; Leanna Sato, MD

## 2016-10-20 NOTE — Progress Notes (Signed)
PULMONARY / CRITICAL CARE MEDICINE   Name: Kevin Holden MRN: [id number] DOB: [birth year]    ADMISSION DATE:  10/15/2016    PT PROFILE:   66 y.o. male smoker intubated after failing BiPAP with diagnosis of AECOPD  MAJOR EVENTS/TEST RESULTS: 05/18 Admitted by Hospitalist service with acute on chronic respiratory failure deemed due to AECOPD 05/18 Echo:The estimated ejection   fraction was in the range of 55% to 60%. Wall motion was normal. Doppler parameters are consistent with abnormal left ventricular relaxation (grade 1 diastolic dysfunction).  05/19 Transferred to ICU/SDU for BiPAP. Failed BiPAP and intubated 05/20 Renal US: No cause for acute renal failure identified. Both kidneys are relatively small, measuring less than 9 cm  05/21 very agitated on WUA. No weaning 05/22 Very agitated on WUA. Dexmedetomidine initiated 05/23 increased airflow obstruction. Precedex changed to propofol. Continued problems with agitation on WUA  INDWELLING DEVICES:: ETT 05/19 >>  L IJ CVL 05/19 >>   MICRO DATA: MRSA PCR 05/19 >> NEG Urine 05/20 >> NEG   ANTIMICROBIALS:  Levofloxacin 05/19 >> 05/22 Ceftriaxone 05/22 >>    SUBJ: RASS -3 on fentanyl/propofol infusions. Agitated on WUA. Not F/C  VITAL SIGNS: BP 93/68   Pulse 98   Temp 98.5 F (36.9 C) (Oral)   Resp 14   Ht 6' (1.829 m)   Wt 121 lb 14.6 oz (55.3 kg)   SpO2 96%   BMI 16.53 kg/m   HEMODYNAMICS: CVP:  [2 mmHg-6 mmHg] 2 mmHg  VENTILATOR SETTINGS: Vent Mode: PRVC FiO2 (%):  [40 %-50 %] 40 % Set Rate:  [14 bmp] 14 bmp Vt Set:  [500 mL] 500 mL PEEP:  [5 cmH20] 5 cmH20 Pressure Support:  [15 cmH20] 15 cmH20 Plateau Pressure:  [11 cmH20-20 cmH20] 20 cmH20  INTAKE / OUTPUT: I/O last 3 completed shifts: In: 2608.7 [I.V.:943.7; NG/GT:1665] Out: 3055 [Urine:2505; Emesis/NG output:550]  PHYSICAL EXAMINATION:  Gen: Thin, intubated, RASS -3, not F/C HEENT: NCAT, sclerae white Neck: NO LAN, no JVD noted Lungs: distant  BS with prolonged exp phase, distant wheezes  Cardiovascular: normal rate, regular rhythm, no M noted Abdomen: Soft, NT, +BS Ext: no C/C/E Neuro: PERRL, EOMI, MAEs Skin: No lesions noted   LABS:  BMET  Recent Labs Lab 10/18/16 0659 10/19/16 0454 10/20/16 0405  NA 138 142 143  K 4.9 4.8 4.5  CL 104 104 101  CO2 29 33* 37*  BUN 81* 50* 30*  CREATININE 2.10* 1.26* 0.82  GLUCOSE 141* 160* 226*    Electrolytes  Recent Labs Lab 10/17/16 1718 10/18/16 0659 10/19/16 0454 10/20/16 0405  CALCIUM  --  8.2* 8.8* 8.6*  MG 2.6* 2.5*  --  2.1  PHOS 6.2* 4.5  --  2.4*    CBC  Recent Labs Lab 10/18/16 0659 10/19/16 0454 10/20/16 0405  WBC 9.1 10.9* 8.0  HGB 12.8* 12.1* 11.9*  HCT 39.0* 37.3* 36.7*  PLT 151 164 170    ABG  Recent Labs Lab 10/16/16 1000 10/16/16 1400 10/17/16 0410  PHART 7.35 7.34* 7.29*  PCO2ART 56* 57* 61*  PO2ART 87 66* 99    CXR: Severe hyperinflation, no acute findings  ASSESSMENT / PLAN:  PULMONARY A: Acute on chronic hypercarbic respiratory failure 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 P:   Cont vent support - settings reviewed and/or adjusted Cont vent bundle Daily SBT if/when meets criteria Cont nebulized steroids and bronchodilators Cont systemic steroids   CARDIOVASCULAR A:  Hypertension, controlled P:  Continue current treatment  RENAL A:  AKI, resolved CKD P:   Monitor BMET intermittently Monitor I/Os Correct electrolytes as indicated Discussed with Dr Wynelle LinkKolluru  GASTROINTESTINAL A:   No issues P:   SUP: enteral famotidine Cont TF protocol  HEMATOLOGIC A:   MIld anemia without acute blood loss P:  DVT px: enoxaparin Monitor CBC intermittently Transfuse per usual guidelines   INFECTIOUS A:   Acute bronchitis P:   Monitor temp, WBC count Micro and abx as above   ENDOCRINE A:   DM 2 Steroid induced hyperglycemia P:   Cont SSI protocol  NEUROLOGIC A:   ICU/vent associated discomfort Acute  encephalopathy P:   RASS goal: -1, -2 Cont PAD protocol Propofol initiated 05/23  Daughter, [name] updated over the phone.   CCM time: 40 mins The above time includes time spent in consultation with patient and/or family members and reviewing care plan on multidisciplinary rounds  [name], MD PCCM service Mobile [phone] Pager [phone] 10/20/2016 4:37 PM

## 2016-10-21 ENCOUNTER — Inpatient Hospital Stay: Payer: Medicare Other

## 2016-10-21 DIAGNOSIS — R410 Disorientation, unspecified: Secondary | ICD-10-CM

## 2016-10-21 LAB — BLOOD GAS, ARTERIAL
Acid-Base Excess: 2.8 mmol/L — ABNORMAL HIGH (ref 0.0–2.0)
Bicarbonate: 31.5 mmol/L — ABNORMAL HIGH (ref 20.0–28.0)
FIO2: 0.3
Mechanical Rate: 8
O2 Saturation: 93.2 %
Patient temperature: 37
pCO2 arterial: 67 mmHg (ref 32.0–48.0)
pH, Arterial: 7.28 — ABNORMAL LOW (ref 7.350–7.450)
pO2, Arterial: 76 mmHg — ABNORMAL LOW (ref 83.0–108.0)

## 2016-10-21 LAB — BASIC METABOLIC PANEL
Anion gap: 6 (ref 5–15)
BUN: 24 mg/dL — AB (ref 6–20)
CO2: 40 mmol/L — ABNORMAL HIGH (ref 22–32)
CREATININE: 0.87 mg/dL (ref 0.61–1.24)
Calcium: 9.3 mg/dL (ref 8.9–10.3)
Chloride: 99 mmol/L — ABNORMAL LOW (ref 101–111)
GFR calc Af Amer: >60 mL/min (ref >60)
Glucose, Bld: 167 mg/dL — ABNORMAL HIGH (ref 65–99)
POTASSIUM: 4.2 mmol/L (ref 3.5–5.1)
SODIUM: 145 mmol/L (ref 135–145)

## 2016-10-21 LAB — GLUCOSE, CAPILLARY
Glucose-Capillary: 125 mg/dL — ABNORMAL HIGH (ref 65–99)
Glucose-Capillary: 134 mg/dL — ABNORMAL HIGH (ref 65–99)
Glucose-Capillary: 135 mg/dL — ABNORMAL HIGH (ref 65–99)
Glucose-Capillary: 140 mg/dL — ABNORMAL HIGH (ref 65–99)
Glucose-Capillary: 147 mg/dL — ABNORMAL HIGH (ref 65–99)
Glucose-Capillary: 161 mg/dL — ABNORMAL HIGH (ref 65–99)

## 2016-10-21 LAB — CBC
HCT: 38.8 % — ABNORMAL LOW (ref 40.0–52.0)
Hemoglobin: 12.5 g/dL — ABNORMAL LOW (ref 13.0–18.0)
MCH: 26.9 pg (ref 26.0–34.0)
MCHC: 32.2 g/dL (ref 32.0–36.0)
MCV: 83.4 fL (ref 80.0–100.0)
PLATELETS: 209 10*3/uL (ref 150–440)
RBC: 4.66 MIL/uL (ref 4.40–5.90)
RDW: 15.1 % — ABNORMAL HIGH (ref 11.5–14.5)
WBC: 8.6 10*3/uL (ref 3.8–10.6)

## 2016-10-21 LAB — MAGNESIUM: MAGNESIUM: 2.3 mg/dL (ref 1.7–2.4)

## 2016-10-21 LAB — PHOSPHORUS: PHOSPHORUS: 2.4 mg/dL — AB (ref 2.5–4.6)

## 2016-10-21 MED ORDER — POLYETHYLENE GLYCOL 3350 17 G PO PACK
17.0000 g | PACK | Freq: Every day | ORAL | Status: DC
  Administered 2016-10-21 – 2016-11-08 (×14): 17 g
  Filled 2016-10-21 (×14): qty 1

## 2016-10-21 MED ORDER — METHYLPREDNISOLONE SODIUM SUCC 125 MG IJ SOLR
80.0000 mg | Freq: Two times a day (BID) | INTRAMUSCULAR | Status: DC
  Administered 2016-10-21 – 2016-10-24 (×6): 80 mg via INTRAVENOUS
  Filled 2016-10-21 (×7): qty 2

## 2016-10-21 MED ORDER — FREE WATER
200.0000 mL | Freq: Three times a day (TID) | Status: AC
  Administered 2016-10-21 – 2016-10-27 (×20): 200 mL

## 2016-10-21 NOTE — Progress Notes (Signed)
[facility] - [place] at [facility]   PATIENT NAME: Kevin Holden    MR#:  [id number]  DATE OF BIRTH:  [birth year]  SUBJECTIVE:  Remains intubated and on the vent  REVIEW OF SYSTEMS:   Review of Systems  Unable to perform ROS: Intubated   Tolerating Diet: TF Tolerating PT: on the vent  DRUG ALLERGIES:   Allergies  Allergen Reactions  . Aspirin Other (See Comments)    Unknown  . Celecoxib Other (See Comments)    Reaction: Unknown  . Nsaids Other (See Comments)    Reaction: Unknown    VITALS:  Blood pressure (!) 166/100, pulse (!) 119, temperature 99.3 F (37.4 C), temperature source Oral, resp. rate 19, height 6' (1.829 m), weight 57 kg (125 lb 10.6 oz), SpO2 97 %.  PHYSICAL EXAMINATION:   Physical Exam  GENERAL:  66 y.o.-year-old patient lying in the bed with no acute distress. Critically ill EYES: Pupils equal, round, reactive to light and accommodation. No scleral icterus. Extraocular muscles intact.  HEENT: Head atraumatic, normocephalic. Oropharynx and nasopharynx clear. Intubated on the vent NECK:  Supple, no jugular venous distention. No thyroid enlargement, no tenderness.  LUNGS: Normal breath sounds bilaterally, no wheezing, rales, rhonchi. No use of accessory muscles of respiration.  CARDIOVASCULAR: S1, S2 normal. No murmurs, rubs, or gallops.  ABDOMEN: Soft, nontender, nondistended. Bowel sounds present. No organomegaly or mass.  EXTREMITIES: No cyanosis, clubbing or edema b/l.    NEUROLOGIC: vent  PSYCHIATRIC: on the vent SKIN: No obvious rash, lesion, or ulcer.   LABORATORY PANEL:  CBC  Recent Labs Lab 10/21/16 0421  WBC 8.6  HGB 12.5*  HCT 38.8*  PLT 209    Chemistries   Recent Labs Lab 10/16/16 0422  10/21/16 0421  NA 141  < > 145  K 5.2*  < > 4.2  CL 98*  < > 99*  CO2 33*  < > 40*  GLUCOSE 140*  < > 167*  BUN 24*  < > 24*  CREATININE 1.33*  < > 0.87  CALCIUM 9.1  < > 9.3  MG  --   < > 2.3  AST 32  --    --   ALT 16*  --   --   ALKPHOS 83  --   --   BILITOT 0.5  --   --   < > = values in this interval not displayed. Cardiac Enzymes  Recent Labs Lab 10/15/16 0433  TROPONINI <0.03   RADIOLOGY:  Dg Abd 1 View  Result Date: 10/21/2016 CLINICAL DATA:  OG tube placement EXAM: ABDOMEN - 1 VIEW COMPARISON:  10/16/2016 FINDINGS: OG tube tip remains in the proximal stomach with the side port in the distal esophagus. IMPRESSION: As above. Electronically Signed   By: Charlett Nose M.D.   On: 10/21/2016 09:22   Dg Chest Port 1 View  Result Date: 10/21/2016 CLINICAL DATA:  Respiratory failure. EXAM: PORTABLE CHEST 1 VIEW COMPARISON:  10/20/2016 . FINDINGS: Endotracheal tube, left IJ line, NG tube in stable position. Heart size normal. Mild right base subsegmental atelectasis. No pleural effusion or pneumothorax. IMPRESSION: 1. Lines and tubes in stable position. 2.  Mild right base subsegmental atelectasis. Electronically Signed   By: Maisie Fus  Register   On: 10/21/2016 06:41   Dg Chest Port 1 View  Result Date: 10/20/2016 CLINICAL DATA:  Respiratory failure. EXAM: PORTABLE CHEST 1 VIEW COMPARISON:  10/18/2016 .  10/04/2016.  10/05/2014. FINDINGS: Endotracheal tube, NG tube, left IJ  line stable position. Heart size normal. Stable nipple shadow again noted on the left. Lungs are clear. No pleural effusion or pneumothorax. Cervical spine fusion. IMPRESSION: 1. Lines and tubes in stable position. 2.  No acute cardiopulmonary disease. Electronically Signed   By: Maisie Fushomas  Register   On: 10/20/2016 06:33   ASSESSMENT AND PLAN:   Kevin Holden is a 66 y.o. black male Asthma/COPD, hypertension, prior history of pneumonia, who [facility]for evaluation of increasing shortness of breath. COPD exacerbation   # Acute on chronic respiratory failure with hypoxia due to COPD exacerbation and pneumonia Still needing full ventilatory support - Sedated Nebs IV antibiotics ceftriaxone IV  steroids On fentanyl and precdex  # Septic shock secondary to pneumonia Off pressors. Resolved   # COPD exacerbation due to acute bronchitis, continue  patient on levofloxacin, Pulmicort, DuoNeb's, prednisone, Spiriva, follow clinically   # Acute kidney injury due to ATN from hypotension Improving slowly  # chronic Tobacco abuse. Counseled  on admission  Case discussed with Care Management/Social Worker. Management plans discussed with the patient, family and they are in agreement.  CODE STATUS: FULL  DVT Prophylaxis: Lovenox  TOTAL TIME TAKING CARE OF THIS PATIENT: 30 minutes.  >50% time spent on counselling and coordination of care  POSSIBLE D/C IN  Few DAYS, DEPENDING ON CLINICAL CONDITION.  Note: This dictation was prepared with Dragon dictation along with smaller phrase technology. Any transcriptional errors that result from this process are unintentional.  Karne Ozga M.D on 10/21/2016 at 12:46 PM  Between 7am to 6pm - Pager - [phone]  After 6pm go to [email or web address] - Social research officer, governmentpassword [email or web address]  [facility] [place] [facility]  Office  [phone]  CC: Primary care physician; Leanna SatoMiles, Linda M, MD

## 2016-10-21 NOTE — Progress Notes (Signed)
OG has migrated out 3-4 cm since Monday.  Dr Sung AmabileSimonds ordered further xray workup, appliance in appropriate anatomical location

## 2016-10-21 NOTE — Progress Notes (Signed)
PULMONARY / CRITICAL CARE MEDICINE   Name: Kevin Holden MRN: [id number] DOB: 1950-10-01    ADMISSION DATE:  10/15/2016    PT PROFILE:   66 y.o. male smoker intubated after failing BiPAP with diagnosis of AECOPD  MAJOR EVENTS/TEST RESULTS: 05/18 Admitted by Hospitalist service with acute on chronic respiratory failure deemed due to AECOPD 05/18 Echo:The estimated ejection   fraction was in the range of 55% to 60%. Wall motion was normal. Doppler parameters are consistent with abnormal left ventricular relaxation (grade 1 diastolic dysfunction).  05/19 Transferred to ICU/SDU for BiPAP. Failed BiPAP and intubated 05/20 Renal US: No cause for acute renal failure identified. Both kidneys are relatively small, measuring less than 9 cm  05/21 very agitated on WUA. No weaning 05/22 Very agitated on WUA. Dexmedetomidine initiated 05/23 increased airflow obstruction. Precedex changed to propofol. Continued problems with agitation on WUA 05/24 persistent severe airflow obstruction. PSV as tolerated.  INDWELLING DEVICES:: ETT 05/19 >>  L IJ CVL 05/19 >>   MICRO DATA: MRSA PCR 05/19 >> NEG Urine 05/20 >> NEG   ANTIMICROBIALS:  Levofloxacin 05/19 >> 05/22 Ceftriaxone 05/22 >>    SUBJ: RASS -3 on fentanyl/propofol infusions. Less agitated on WUA. Not F/C  VITAL SIGNS: BP 93/69 (BP Location: Left Arm)   Pulse 88   Temp 98.1 F (36.7 C) (Oral)   Resp 13   Ht 6' (1.829 m)   Wt 125 lb 10.6 oz (57 kg)   SpO2 95%   BMI 17.04 kg/m   HEMODYNAMICS:    VENTILATOR SETTINGS: Vent Mode: PRVC FiO2 (%):  [35 %-40 %] 35 % Set Rate:  [14 bmp] 14 bmp Vt Set:  [500 mL] 500 mL PEEP:  [5 cmH20] 5 cmH20  INTAKE / OUTPUT: I/O last 3 completed shifts: In: 2780.4 [I.V.:890.4; NG/GT:1790; IV Piggyback:100] Out: 2665 [Urine:2665]  PHYSICAL EXAMINATION:  Gen: Thin, intubated, RASS -3, not F/C HEENT: NCAT, sclerae white Neck: NO LAN, no JVD noted Lungs: distant BS with very prolonged exp  phase, scattered distant wheezes  Cardiovascular: normal rate, regular rhythm, no M noted Abdomen: Soft, NT, +BS Ext: no C/C/E Neuro: PERRL, EOMI, MAEs Skin: No lesions noted   LABS:  BMET  Recent Labs Lab 10/19/16 0454 10/20/16 0405 10/21/16 0421  NA 142 143 145  K 4.8 4.5 4.2  CL 104 101 99*  CO2 33* 37* 40*  BUN 50* 30* 24*  CREATININE 1.26* 0.82 0.87  GLUCOSE 160* 226* 167*    Electrolytes  Recent Labs Lab 10/18/16 0659 10/19/16 0454 10/20/16 0405 10/21/16 0421  CALCIUM 8.2* 8.8* 8.6* 9.3  MG 2.5*  --  2.1 2.3  PHOS 4.5  --  2.4* 2.4*    CBC  Recent Labs Lab 10/19/16 0454 10/20/16 0405 10/21/16 0421  WBC 10.9* 8.0 8.6  HGB 12.1* 11.9* 12.5*  HCT 37.3* 36.7* 38.8*  PLT 164 170 209    ABG  Recent Labs Lab 10/16/16 1000 10/16/16 1400 10/17/16 0410  PHART 7.35 7.34* 7.29*  PCO2ART 56* 57* 61*  PO2ART 87 66* 99    CXR: NSC severe hyperinflation, no acute findings  ASSESSMENT / PLAN:  PULMONARY A: Acute on chronic hypercarbic respiratory failure 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 Very severe airflow obstruction P:   Cont vent support - settings reviewed and/or adjusted Cont vent bundle Daily SBT if/when meets criteria Cont nebulized steroids and bronchodilators Cont systemic steroids - dose increased 05/24  CARDIOVASCULAR A:  Hypertension, controlled P:  Continue current treatment  RENAL  A:   AKI, resolved CKD P:   Monitor BMET intermittently Monitor I/Os Correct electrolytes as indicated Free water added 05/24  GASTROINTESTINAL A:   No issues P:   SUP: enteral famotidine Cont TF protocol  HEMATOLOGIC A:   MIld anemia without acute blood loss P:  DVT px: enoxaparin Monitor CBC intermittently Transfuse per usual guidelines   INFECTIOUS A:   Acute bronchitis P:   Monitor temp, WBC count Micro and abx as above   ENDOCRINE A:   DM 2 Steroid induced hyperglycemia P:   Cont SSI  protocol  NEUROLOGIC A:   ICU/vent associated discomfort Acute encephalopathy Prognosis for functional recovery guarded P:   RASS goal: -1, -2 Cont PAD protocol Continue propofol - initiated 05/23  Daughter, Kevin Holden, updated at bedside. Multiple family members intend to meet with MD 05/25. I indicated that we are at the point where we need to start making difficult decisions. Options include one-way extubation versus tracheostomy tube versus trial of extubation and tracheostomy tube if fails. Kevin Holden expressed that she did not think that her father would want to undergo long-term mechanical ventilation but the conversation has to be had with other family members.    CCM time: 40 mins The above time includes time spent in consultation with patient and/or family members and reviewing care plan on multidisciplinary rounds  Billy Fischeravid Diontre Harps, MD PCCM service Mobile [phone] Pager [phone] 10/21/2016 4:30 PM

## 2016-10-22 DIAGNOSIS — J9601 Acute respiratory failure with hypoxia: Secondary | ICD-10-CM

## 2016-10-22 LAB — GLUCOSE, CAPILLARY
Glucose-Capillary: 111 mg/dL — ABNORMAL HIGH (ref 65–99)
Glucose-Capillary: 120 mg/dL — ABNORMAL HIGH (ref 65–99)
Glucose-Capillary: 126 mg/dL — ABNORMAL HIGH (ref 65–99)
Glucose-Capillary: 157 mg/dL — ABNORMAL HIGH (ref 65–99)
Glucose-Capillary: 159 mg/dL — ABNORMAL HIGH (ref 65–99)
Glucose-Capillary: 169 mg/dL — ABNORMAL HIGH (ref 65–99)

## 2016-10-22 LAB — CREATININE, SERUM
Creatinine, Ser: 0.75 mg/dL (ref 0.61–1.24)
GFR calc Af Amer: >60 mL/min (ref >60)
GFR calc non Af Amer: >60 mL/min (ref >60)

## 2016-10-22 MED ORDER — SODIUM CHLORIDE 0.9 % IV SOLN
0.4000 ug/kg/h | INTRAVENOUS | Status: DC
  Administered 2016-10-22: 0.4 ug/kg/h via INTRAVENOUS
  Filled 2016-10-22 (×2): qty 2

## 2016-10-22 NOTE — Progress Notes (Signed)
Dana Np at bedside to assess patient during wake up assessment.  Sedation restrated at 100%, and bolus of versed given.

## 2016-10-22 NOTE — Progress Notes (Signed)
[facility] - [place] at [facility]   PATIENT NAME: Kevin Holden    MR#:  [id number]  DATE OF BIRTH:  07-Nov-1950  SUBJECTIVE:  Remains intubated and on the vent Patient on fentanyl and propofol  REVIEW OF SYSTEMS:   Review of Systems  Unable to perform ROS: Intubated   Tolerating Diet: TF Tolerating PT: on the vent  DRUG ALLERGIES:   Allergies  Allergen Reactions  . Aspirin Other (See Comments)    Unknown  . Celecoxib Other (See Comments)    Reaction: Unknown  . Nsaids Other (See Comments)    Reaction: Unknown    VITALS:  Blood pressure (!) 184/87, pulse (!) 104, temperature 98.7 F (37.1 C), temperature source Oral, resp. rate 11, height 6' (1.829 m), weight 55.5 kg (122 lb 5.7 oz), SpO2 100 %.  PHYSICAL EXAMINATION:   Physical Exam  GENERAL:  66 y.o.-year-old patient lying in the bed with no acute distress. Critically ill EYES: Pupils equal, round, reactive to light and accommodation. No scleral icterus. Extraocular muscles intact.  HEENT: Head atraumatic, normocephalic. Oropharynx and nasopharynx clear. Intubated on the vent NECK:  Supple, no jugular venous distention. No thyroid enlargement, no tenderness.  LUNGS: Normal breath sounds bilaterally, no wheezing, rales, rhonchi. No use of accessory muscles of respiration.  CARDIOVASCULAR: S1, S2 normal. No murmurs, rubs, or gallops.  ABDOMEN: Soft, nontender, nondistended. Bowel sounds present. No organomegaly or mass.  EXTREMITIES: No cyanosis, clubbing or edema b/l.    NEUROLOGIC: vent  PSYCHIATRIC: on the vent SKIN: No obvious rash, lesion, or ulcer.   LABORATORY PANEL:  CBC  Recent Labs Lab 10/21/16 0421  WBC 8.6  HGB 12.5*  HCT 38.8*  PLT 209    Chemistries   Recent Labs Lab 10/16/16 0422  10/21/16 0421 10/22/16 0447  NA 141  < > 145  --   K 5.2*  < > 4.2  --   CL 98*  < > 99*  --   CO2 33*  < > 40*  --   GLUCOSE 140*  < > 167*  --   BUN 24*  < > 24*  --    CREATININE 1.33*  < > 0.87 0.75  CALCIUM 9.1  < > 9.3  --   MG  --   < > 2.3  --   AST 32  --   --   --   ALT 16*  --   --   --   ALKPHOS 83  --   --   --   BILITOT 0.5  --   --   --   < > = values in this interval not displayed. Cardiac Enzymes No results for input(s): TROPONINI in the last 168 hours. RADIOLOGY:  Dg Abd 1 View  Result Date: 10/21/2016 CLINICAL DATA:  OG tube placement EXAM: ABDOMEN - 1 VIEW COMPARISON:  10/16/2016 FINDINGS: OG tube tip remains in the proximal stomach with the side port in the distal esophagus. IMPRESSION: As above. Electronically Signed   By: Charlett Nose M.D.   On: 10/21/2016 09:22   Dg Chest Port 1 View  Result Date: 10/21/2016 CLINICAL DATA:  Respiratory failure. EXAM: PORTABLE CHEST 1 VIEW COMPARISON:  10/20/2016 . FINDINGS: Endotracheal tube, left IJ line, NG tube in stable position. Heart size normal. Mild right base subsegmental atelectasis. No pleural effusion or pneumothorax. IMPRESSION: 1. Lines and tubes in stable position. 2.  Mild right base subsegmental atelectasis. Electronically Signed   By: Maisie Fus  Register   On: 10/21/2016 06:41   ASSESSMENT AND PLAN:   Kevin Holden is a 66 y.o. black male Asthma/COPD, hypertension, prior history of pneumonia, who was [facility]for evaluation of increasing shortness of breath. COPD exacerbation   # Acute on chronic respiratory failure with hypoxia due to COPD exacerbation and pneumonia Still needing full ventilatory support - Sedated Nebs IV steroids On fentanyl and precdex  # Septic shock secondary to pneumonia Off pressors. Resolved   # COPD exacerbation due to acute bronchitis, continue  patient on levofloxacin, Pulmicort, DuoNeb's, prednisone, Spiriva, follow clinically   # Acute kidney injury due to ATN from hypotension Improving slowly  # chronic Tobacco abuse. Counseled  on admission  Case discussed with Care Management/Social Worker. Management plans  discussed with the patient, family and they are in agreement.  CODE STATUS: FULL  DVT Prophylaxis: Lovenox  TOTAL TIME TAKING CARE OF THIS PATIENT: 30 minutes.  >50% time spent on counselling and coordination of care  POSSIBLE D/C IN  Few DAYS, DEPENDING ON CLINICAL CONDITION.  Note: This dictation was prepared with Dragon dictation along with smaller phrase technology. Any transcriptional errors that result from this process are unintentional.  Drucilla Cumber M.D on 10/22/2016 at 1:18 PM  Between 7am to 6pm - Pager - [phone]  After 6pm go to [email or web address] - Social research officer, governmentpassword [facility] [facility]  [facility] [place] [facility]  Office  [phone]  CC: Primary care physician; Leanna SatoMiles, Linda M, MD

## 2016-10-22 NOTE — Progress Notes (Signed)
PULMONARY / CRITICAL CARE MEDICINE   Name: Kevin BurrowJoseph Holden MRN: [id number] DOB: [birth year]    ADMISSION DATE:  10/15/2016    PT PROFILE:   66 y.o. male smoker intubated after failing BiPAP with diagnosis of AECOPD  MAJOR EVENTS/TEST RESULTS: 05/18 Admitted by Hospitalist service with acute on chronic respiratory failure deemed due to AECOPD 05/18 Echo:The estimated ejection   fraction was in the range of 55% to 60%. Wall motion was normal. Doppler parameters are consistent with abnormal left ventricular relaxation (grade 1 diastolic dysfunction).  05/19 Transferred to ICU/SDU for BiPAP. Failed BiPAP and intubated 05/20 Renal US: No cause for acute renal failure identified. Both kidneys are relatively small, measuring less than 9 cm  05/21 very agitated on WUA. No weaning 05/22 Very agitated on WUA. Dexmedetomidine initiated 05/23 increased airflow obstruction. Precedex changed to propofol. Continued problems with agitation on WUA 05/24 persistent severe airflow obstruction. PSV as tolerated.  INDWELLING DEVICES:: ETT 05/19 >>  L IJ CVL 05/19 >>   MICRO DATA: MRSA PCR 05/19 >> NEG Urine 05/20 >> NEG   ANTIMICROBIALS:  Levofloxacin 05/19 >> 05/22 Ceftriaxone 05/22 >>   SUBJ: During wake-up assessment today pt severely tachypnic with use of accessory muscles and heart rate elevated to 150's, therefore pt placed back on sedation.  Pts 2 daughters were at bedside during WUA.  VITAL SIGNS: BP 111/65   Pulse 93   Temp 98.7 F (37.1 C) (Oral)   Resp 10   Ht 6' (1.829 m)   Wt 55.5 kg (122 lb 5.7 oz)   SpO2 96%   BMI 16.59 kg/m   HEMODYNAMICS:    VENTILATOR SETTINGS: Vent Mode: PRVC FiO2 (%):  [35 %-40 %] 35 % Set Rate:  [14 bmp] 14 bmp Vt Set:  [500 mL] 500 mL PEEP:  [5 cmH20] 5 cmH20  INTAKE / OUTPUT: I/O last 3 completed shifts: In: 2975.3 [I.V.:990.3; NG/GT:1935; IV Piggyback:50] Out: 1810 [Urine:1810]  PHYSICAL EXAMINATION: Gen: acutely ill frail appearing AA  male, mechanically intubated  Neuro: not following commands, withdraws from painful stimulation, PERRL HEENT: supple, JVD present  Lungs: distant breath sounds with very prolonged exp phase, scattered distant wheezes, labored   Cardiovascular: nsr, s1s2, no M/R/G Abdomen: soft, +BS x4, soft, non tender, non distended  Musculoskeletal: normal tone, 1+ bilateral lower extremity edema Skin: intact no rashes or lesions  LABS:  BMET  Recent Labs Lab 10/19/16 0454 10/20/16 0405 10/21/16 0421 10/22/16 0447  NA 142 143 145  --   K 4.8 4.5 4.2  --   CL 104 101 99*  --   CO2 33* 37* 40*  --   BUN 50* 30* 24*  --   CREATININE 1.26* 0.82 0.87 0.75  GLUCOSE 160* 226* 167*  --     Electrolytes  Recent Labs Lab 10/18/16 0659 10/19/16 0454 10/20/16 0405 10/21/16 0421  CALCIUM 8.2* 8.8* 8.6* 9.3  MG 2.5*  --  2.1 2.3  PHOS 4.5  --  2.4* 2.4*    CBC  Recent Labs Lab 10/19/16 0454 10/20/16 0405 10/21/16 0421  WBC 10.9* 8.0 8.6  HGB 12.1* 11.9* 12.5*  HCT 37.3* 36.7* 38.8*  PLT 164 170 209    ABG  Recent Labs Lab 10/16/16 1000 10/16/16 1400 10/17/16 0410  PHART 7.35 7.34* 7.29*  PCO2ART 56* 57* 61*  PO2ART 87 66* 99    CXR: NSC severe hyperinflation, no acute findings  ASSESSMENT / PLAN:  PULMONARY A: Acute on chronic hypercarbic respiratory failure Severe (  by Herby Abraham) emphysema AECOPD Very severe airflow obstruction P:   Cont vent support - settings reviewed and/or adjusted Cont vent bundle Daily SBT if/when meets criteria Cont nebulized steroids and bronchodilators Cont systemic steroids - dose increased 05/24  CARDIOVASCULAR A:  Hypertension, controlled P:  Continuous telemetry monitoring Continue prn metoprolol and hydralazine   RENAL A:   Acute on chronic renal failure  P:   Monitor BMET intermittently Monitor I/Os Correct electrolytes as indicated  GASTROINTESTINAL A:   No acute issues P:   SUP: enteral famotidine Cont TF  protocol  HEMATOLOGIC A:   MIld anemia without acute blood loss P:  DVT px: enoxaparin Monitor CBC intermittently Transfuse for hgb <7 Monitor for s/sx of bleeding   INFECTIOUS A:   Acute bronchitis P:   Monitor temp, WBC count Micro and abx as above   ENDOCRINE A:   DM 2 Steroid induced hyperglycemia P:   Cont SSI protocol  NEUROLOGIC A:   ICU/vent associated discomfort Acute encephalopathy Prognosis for functional recovery guarded P:   RASS goal: -1, -2 Cont PAD protocol Continue propofol gtt- initiated 05/23 WUA daily    -Spoke with pts 3 daughters 10/22/16 regarding goals of treatment they would like to proceed with tracheostomy placement if indicated.  However, in the event the pt were to cardiac arrest they WOULD NOT want pt to receive CPR, ACLS medications, or Defibrillation or Cardioversion.  Therefore, pts code status changed to Limited Code stating pt may be intubated/mechanically ventilated and the use of NIPPV/Bipap would be ok if indicated.  Sonda Rumble, AGNP  Pulmonary/Critical Care Pager [phone] (please enter 7 digits) PCCM Consult Pager [phone] (please enter 7 digits)

## 2016-10-23 ENCOUNTER — Inpatient Hospital Stay: Payer: Medicare Other

## 2016-10-23 LAB — CBC WITH DIFFERENTIAL/PLATELET
BASOS PCT: 0 %
Basophils Absolute: 0 10*3/uL (ref 0–0.1)
Eosinophils Absolute: 0.1 10*3/uL (ref 0–0.7)
Eosinophils Relative: 1 %
HEMATOCRIT: 38.2 % — AB (ref 40.0–52.0)
HEMOGLOBIN: 12.5 g/dL — AB (ref 13.0–18.0)
LYMPHS PCT: 11 %
Lymphs Abs: 1.3 10*3/uL (ref 1.0–3.6)
MCH: 27.6 pg (ref 26.0–34.0)
MCHC: 32.7 g/dL (ref 32.0–36.0)
MCV: 84.2 fL (ref 80.0–100.0)
Monocytes Absolute: 0.7 10*3/uL (ref 0.2–1.0)
Monocytes Relative: 6 %
NEUTROS ABS: 9.8 10*3/uL — AB (ref 1.4–6.5)
NEUTROS PCT: 82 %
Platelets: 226 10*3/uL (ref 150–440)
RBC: 4.53 MIL/uL (ref 4.40–5.90)
RDW: 15.2 % — ABNORMAL HIGH (ref 11.5–14.5)
WBC: 12 10*3/uL — ABNORMAL HIGH (ref 3.8–10.6)

## 2016-10-23 LAB — BASIC METABOLIC PANEL
ANION GAP: 5 (ref 5–15)
BUN: 21 mg/dL — ABNORMAL HIGH (ref 6–20)
CHLORIDE: 95 mmol/L — AB (ref 101–111)
CO2: 43 mmol/L — AB (ref 22–32)
Calcium: 9.3 mg/dL (ref 8.9–10.3)
Creatinine, Ser: 0.74 mg/dL (ref 0.61–1.24)
GFR calc non Af Amer: >60 mL/min (ref >60)
GLUCOSE: 145 mg/dL — AB (ref 65–99)
Potassium: 4.7 mmol/L (ref 3.5–5.1)
Sodium: 143 mmol/L (ref 135–145)

## 2016-10-23 LAB — GLUCOSE, CAPILLARY
Glucose-Capillary: 107 mg/dL — ABNORMAL HIGH (ref 65–99)
Glucose-Capillary: 126 mg/dL — ABNORMAL HIGH (ref 65–99)
Glucose-Capillary: 140 mg/dL — ABNORMAL HIGH (ref 65–99)
Glucose-Capillary: 144 mg/dL — ABNORMAL HIGH (ref 65–99)
Glucose-Capillary: 156 mg/dL — ABNORMAL HIGH (ref 65–99)
Glucose-Capillary: 170 mg/dL — ABNORMAL HIGH (ref 65–99)

## 2016-10-23 LAB — TRIGLYCERIDES: Triglycerides: 93 mg/dL (ref <150)

## 2016-10-23 MED ORDER — LORAZEPAM 2 MG/ML IJ SOLN
INTRAMUSCULAR | Status: AC
  Administered 2016-10-23: 2 mg
  Filled 2016-10-23: qty 1

## 2016-10-23 NOTE — Progress Notes (Signed)
PULMONARY / CRITICAL CARE MEDICINE   Name: Bensyn Bornemann MRN: [id number] DOB: [birth year]    ADMISSION DATE:  10/15/2016    PT PROFILE:   66 y.o. male smoker intubated after failing BiPAP with diagnosis of AECOPD  MAJOR EVENTS/TEST RESULTS: 05/18 Admitted by Hospitalist service with acute on chronic respiratory failure deemed due to AECOPD 05/18 Echo:The estimated ejection   fraction was in the range of 55% to 60%. Wall motion was normal. Doppler parameters are consistent with abnormal left ventricular relaxation (grade 1 diastolic dysfunction).  05/19 Transferred to ICU/SDU for BiPAP. Failed BiPAP and intubated 05/20 Renal US: No cause for acute renal failure identified. Both kidneys are relatively small, measuring less than 9 cm  05/21 very agitated on WUA. No weaning 05/22 Very agitated on WUA. Dexmedetomidine initiated 05/23 increased airflow obstruction. Precedex changed to propofol. Continued problems with agitation on WUA 05/24 persistent severe airflow obstruction. PSV as tolerated.  INDWELLING DEVICES:: ETT 05/19 >>  L IJ CVL 05/19 >>   MICRO DATA: MRSA PCR 05/19 >> NEG Urine 05/20 >> NEG   ANTIMICROBIALS:  Levofloxacin 05/19 >> 05/22 Ceftriaxone 05/22 >>   SUBJ: No significant change in status over the last 24 hours. Not weaning at this time  VITAL SIGNS: BP (!) 82/63   Pulse 97   Temp 99.3 F (37.4 C) (Oral)   Resp 18   Ht 6' (1.829 m)   Wt 59.8 kg (131 lb 13.4 oz)   SpO2 96%   BMI 17.88 kg/m   VENTILATOR SETTINGS: Vent Mode: PRVC FiO2 (%):  [35 %] 35 % Set Rate:  [14 bmp] 14 bmp Vt Set:  [500 mL] 500 mL PEEP:  [5 cmH20] 5 cmH20  INTAKE / OUTPUT: I/O last 3 completed shifts: In: 3320.4 [I.V.:665.4; Other:1375; NG/GT:1280] Out: 690 [Urine:690]  PHYSICAL EXAMINATION: Gen: acutely ill frail appearing [name], mechanically intubated  Neuro: not following commands, withdraws from painful stimulation, PERRL HEENT: supple, JVD present  Lungs:  distant breath sounds with very prolonged exp phase, scattered distant wheezes, labored   Cardiovascular: nsr, s1s2, no M/R/G Abdomen: soft, +BS x4, soft, non tender, non distended  Musculoskeletal: normal tone, 1+ bilateral lower extremity edema Skin: intact no rashes or lesions  LABS:  BMET  Recent Labs Lab 10/20/16 0405 10/21/16 0421 10/22/16 0447 10/23/16 0436  NA 143 145  --  143  K 4.5 4.2  --  4.7  CL 101 99*  --  95*  CO2 37* 40*  --  43*  BUN 30* 24*  --  21*  CREATININE 0.82 0.87 0.75 0.74  GLUCOSE 226* 167*  --  145*    Electrolytes  Recent Labs Lab 10/18/16 0659  10/20/16 0405 10/21/16 0421 10/23/16 0436  CALCIUM 8.2*  < > 8.6* 9.3 9.3  MG 2.5*  --  2.1 2.3  --   PHOS 4.5  --  2.4* 2.4*  --   < > = values in this interval not displayed.  CBC  Recent Labs Lab 10/20/16 0405 10/21/16 0421 10/23/16 0436  WBC 8.0 8.6 12.0*  HGB 11.9* 12.5* 12.5*  HCT 36.7* 38.8* 38.2*  PLT 170 209 226    ABG  Recent Labs Lab 10/16/16 1000 10/16/16 1400 10/17/16 0410  PHART 7.35 7.34* 7.29*  PCO2ART 56* 57* 61*  PO2ART 87 66* 99    CXR: NSC severe hyperinflation, no acute findings  ASSESSMENT / PLAN:  PULMONARY A: Acute on chronic hypercarbic respiratory failure 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 Very  severe airflow obstruction P:   Cont vent support - settings reviewed and/or adjusted Cont vent bundle Daily SBT if/when meets criteria Cont nebulized steroids and bronchodilators Cont systemic steroids - dose increased 05/24  CARDIOVASCULAR A:  Hypertension, controlled P:  Continuous telemetry monitoring Continue prn metoprolol and hydralazine   RENAL A:   Acute on chronic renal failure  P:   Monitor BMET intermittently Monitor I/Os Correct electrolytes as indicated  GASTROINTESTINAL A:   No acute issues P:   SUP: enteral famotidine Cont TF protocol  HEMATOLOGIC A:   MIld anemia without acute blood loss P:  DVT px:  enoxaparin Monitor CBC intermittently Transfuse for hgb <7 Monitor for s/sx of bleeding   INFECTIOUS A:   Acute bronchitis P:   Monitor temp, WBC count Micro and abx as above   ENDOCRINE A:   DM 2 Steroid induced hyperglycemia P:   Cont SSI protocol  NEUROLOGIC A:   ICU/vent associated discomfort Acute encephalopathy Prognosis for functional recovery guarded P:   RASS goal: -1, -2 Cont PAD protocol Continue propofol gtt- initiated 05/23 WUA daily    Goals of care: 3 daughters 10/22/16 regarding goals of treatment they would like to proceed with tracheostomy placement if indicated.  However, in the event the pt were to cardiac arrest they WOULD NOT want pt to receive CPR, ACLS medications, or Defibrillation or Cardioversion.  Therefore, pts code status changed to Limited Code stating pt may be intubated/mechanically ventilated and the use of NIPPV/Bipap would be ok if indicated.  Critical care time 35 minutes. Ongoing respiratory failure on mechanical ventilation  Tora KindredJohn Ardie Dragoo, DO

## 2016-10-23 NOTE — Progress Notes (Signed)
[facility] - [place] [facility]   PATIENT NAME: Kevin Holden    MR#:  [id number]  DATE OF BIRTH:  1951-04-15  SUBJECTIVE:  Remains intubated and on the vent Patient on fentanyl and propofol  REVIEW OF SYSTEMS:   Review of Systems  Unable to perform ROS: Intubated   Tolerating Diet: TF Tolerating PT: on the vent  DRUG ALLERGIES:   Allergies  Allergen Reactions  . Aspirin Other (See Comments)    Unknown  . Celecoxib Other (See Comments)    Reaction: Unknown  . Nsaids Other (See Comments)    Reaction: Unknown    VITALS:  Blood pressure (!) 142/112, pulse (!) 115, temperature 99.3 F (37.4 C), temperature source Oral, resp. rate 11, height 6' (1.829 m), weight 59.8 kg (131 lb 13.4 oz), SpO2 94 %.  PHYSICAL EXAMINATION:   Physical Exam  GENERAL:  66 y.o.-year-old patient lying in the bed with no acute distress. Critically ill EYES: Pupils equal, round, reactive to light and accommodation. No scleral icterus. Extraocular muscles intact.  HEENT: Head atraumatic, normocephalic. Oropharynx and nasopharynx clear. Intubated on the vent NECK:  Supple, no jugular venous distention. No thyroid enlargement, no tenderness.  LUNGS: Normal breath sounds bilaterally, no wheezing, rales, rhonchi. No use of accessory muscles of respiration.  CARDIOVASCULAR: S1, S2 normal. No murmurs, rubs, or gallops.  ABDOMEN: Soft, nontender, nondistended. Bowel sounds present. No organomegaly or mass.  EXTREMITIES: No cyanosis, clubbing or edema b/l.    NEUROLOGIC: vent  PSYCHIATRIC: on the vent SKIN: No obvious rash, lesion, or ulcer.   LABORATORY PANEL:  CBC  Recent Labs Lab 10/23/16 0436  WBC 12.0*  HGB 12.5*  HCT 38.2*  PLT 226    Chemistries   Recent Labs Lab 10/21/16 0421  10/23/16 0436  NA 145  --  143  K 4.2  --  4.7  CL 99*  --  95*  CO2 40*  --  43*  GLUCOSE 167*  --  145*  BUN 24*  --  21*  CREATININE 0.87  < > 0.74  CALCIUM 9.3  --   9.3  MG 2.3  --   --   < > = values in this interval not displayed. Cardiac Enzymes No results for input(s): TROPONINI in the last 168 hours. RADIOLOGY:  Dg Chest Port 1 View  Result Date: 10/23/2016 CLINICAL DATA:  66 year old male with a history of intubation. Respiratory problems. EXAM: PORTABLE CHEST 1 VIEW COMPARISON:  10/21/2016 FINDINGS: Cardiomediastinal silhouette unchanged. Endotracheal tube relatively unchanged, terminating 7.9 cm above the carina. Unchanged position of left IJ approach central venous catheter which appears to terminate superior vena cava. Gastric tube projects over the mediastinum, terminating out of the field of view. No pneumothorax. No confluent airspace disease. No pleural effusion. Chronic lung changes, similar prior. IMPRESSION: Similar appearance of the chest x-ray, with no pneumothorax or confluent airspace disease. Unchanged gastric tube, left IJ central line, and endotracheal tube. Electronically Signed   By: Gilmer MorJaime  Wagner D.O.   On: 10/23/2016 09:49   ASSESSMENT AND PLAN:   [name] is a 66 y.o. black male Asthma/COPD, hypertension, prior history of pneumonia, who was [facility]for evaluation of increasing shortness of breath. COPD exacerbation   # Acute on chronic respiratory failure with hypoxia due to COPD exacerbation and pneumonia Still needing full ventilatory support - Sedated Nebs IV steroids On fentanyl and precdex  # Septic shock secondary to pneumonia Off pressors. Resolved   #  COPD exacerbation due to acute bronchitis, continue  patient on levofloxacin, Pulmicort, DuoNeb's, prednisone, Spiriva, follow clinically   # Acute kidney injury due to ATN from hypotension Improving slowly  # chronic Tobacco abuse. Counseled  on admission  Case discussed with Care Management/Social Worker. Management plans discussed with the patient, family and they are in agreement.  CODE STATUS: FULL  DVT Prophylaxis:  Lovenox  TOTAL TIME TAKING CARE OF THIS PATIENT: 25 minutes.  >50% time spent on counselling and coordination of care  POSSIBLE D/C IN  Few DAYS, DEPENDING ON CLINICAL CONDITION.  Note: This dictation was prepared with Dragon dictation along with smaller phrase technology. Any transcriptional errors that result from this process are unintentional.  Hogan Hoobler M.D on 10/23/2016 at 2:36 PM  Between 7am to 6pm - Pager - [phone]  After 6pm go to [email or web address] - Social research officer, government  [facility] [place] [facility]  Office  [phone]  CC: Primary care physician; Leanna Sato, MD

## 2016-10-24 ENCOUNTER — Inpatient Hospital Stay: Payer: Medicare Other

## 2016-10-24 LAB — GLUCOSE, CAPILLARY
Glucose-Capillary: 118 mg/dL — ABNORMAL HIGH (ref 65–99)
Glucose-Capillary: 134 mg/dL — ABNORMAL HIGH (ref 65–99)
Glucose-Capillary: 142 mg/dL — ABNORMAL HIGH (ref 65–99)
Glucose-Capillary: 156 mg/dL — ABNORMAL HIGH (ref 65–99)
Glucose-Capillary: 157 mg/dL — ABNORMAL HIGH (ref 65–99)
Glucose-Capillary: 166 mg/dL — ABNORMAL HIGH (ref 65–99)
Glucose-Capillary: 170 mg/dL — ABNORMAL HIGH (ref 65–99)

## 2016-10-24 MED ORDER — BACITRACIN-NEOMYCIN-POLYMYXIN 400-5-5000 EX OINT
TOPICAL_OINTMENT | Freq: Every day | CUTANEOUS | Status: DC
  Filled 2016-10-24: qty 1

## 2016-10-24 MED ORDER — LACTULOSE 10 GM/15ML PO SOLN
20.0000 g | Freq: Once | ORAL | Status: AC
  Administered 2016-10-24: 20 g
  Filled 2016-10-24: qty 30

## 2016-10-24 NOTE — Progress Notes (Signed)
[facility] - [place] at [facility]   PATIENT NAME: Kevin BurrowJoseph Holden    MR#:  [id number]  DATE OF BIRTH:  March 06, 1951  SUBJECTIVE:  Remains intubated and on the vent Patient on fentanyl and propofol No successful weaning REVIEW OF SYSTEMS:   Review of Systems  Unable to perform ROS: Intubated   Tolerating Diet: TF Tolerating PT: on the vent  DRUG ALLERGIES:   Allergies  Allergen Reactions  . Aspirin Other (See Comments)    Unknown  . Celecoxib Other (See Comments)    Reaction: Unknown  . Nsaids Other (See Comments)    Reaction: Unknown    VITALS:  Blood pressure 97/62, pulse 88, temperature 98.9 F (37.2 C), temperature source Oral, resp. rate 15, height 6' (1.829 m), weight 59.8 kg (131 lb 13.4 oz), SpO2 93 %.  PHYSICAL EXAMINATION:   Physical Exam  GENERAL:  66 y.o.-year-old patient lying in the bed with no acute distress. Critically ill EYES: Pupils equal, round, reactive to light and accommodation. No scleral icterus. Extraocular muscles intact.  HEENT: Head atraumatic, normocephalic. Oropharynx and nasopharynx clear. Intubated on the vent NECK:  Supple, no jugular venous distention. No thyroid enlargement, no tenderness.  LUNGS: Normal breath sounds bilaterally, no wheezing, rales, rhonchi. No use of accessory muscles of respiration.  CARDIOVASCULAR: S1, S2 normal. No murmurs, rubs, or gallops.  ABDOMEN: Soft, nontender, nondistended. Bowel sounds present. No organomegaly or mass.  EXTREMITIES: No cyanosis, clubbing or edema b/l.    NEUROLOGIC: vent  PSYCHIATRIC: on the vent SKIN: No obvious rash, lesion, or ulcer.   LABORATORY PANEL:  CBC  Recent Labs Lab 10/23/16 0436  WBC 12.0*  HGB 12.5*  HCT 38.2*  PLT 226    Chemistries   Recent Labs Lab 10/21/16 0421  10/23/16 0436  NA 145  --  143  K 4.2  --  4.7  CL 99*  --  95*  CO2 40*  --  43*  GLUCOSE 167*  --  145*  BUN 24*  --  21*  CREATININE 0.87  < > 0.74  CALCIUM  9.3  --  9.3  MG 2.3  --   --   < > = values in this interval not displayed. Cardiac Enzymes No results for input(s): TROPONINI in the last 168 hours. RADIOLOGY:  Dg Abd 1 View  Result Date: 10/24/2016 CLINICAL DATA:  Follow-up ileus, on vent EXAM: ABDOMEN - 1 VIEW COMPARISON:  10/21/2016 and 10/16/2016 FINDINGS: No evidence of bowel obstruction or ileus. Enteric tube terminates in the gastric cardia. Mild degenerative changes of the lower lumbar spine. Right iliac stent. IMPRESSION: No evidence of bowel obstruction or ileus. Electronically Signed   By: Charline BillsSriyesh  Krishnan M.D.   On: 10/24/2016 09:23   Dg Chest Port 1 View  Result Date: 10/24/2016 CLINICAL DATA:  On ventilator. EXAM: PORTABLE CHEST 1 VIEW COMPARISON:  Radiographs Oct 23, 2016. FINDINGS: The heart size and mediastinal contours are within normal limits. Both lungs are clear. No pneumothorax or pleural effusion is noted. Hyperexpansion of the lungs is noted. Endotracheal and nasogastric tubes are unchanged in position. Left internal jugular catheter is noted with distal tip in expected position the SVC. The visualized skeletal structures are unremarkable. IMPRESSION: Hyperexpansion the lungs suggesting chronic obstructive pulmonary disease. Stable support apparatus. No acute abnormality seen. Electronically Signed   By: Lupita RaiderJames  Green Jr, M.D.   On: 10/24/2016 08:57   Dg Chest Port 1 View  Result Date: 10/23/2016 CLINICAL DATA:  66 year old male with a history of intubation. Respiratory problems. EXAM: PORTABLE CHEST 1 VIEW COMPARISON:  10/21/2016 FINDINGS: Cardiomediastinal silhouette unchanged. Endotracheal tube relatively unchanged, terminating 7.9 cm above the carina. Unchanged position of left IJ approach central venous catheter which appears to terminate superior vena cava. Gastric tube projects over the mediastinum, terminating out of the field of view. No pneumothorax. No confluent airspace disease. No pleural effusion. Chronic lung  changes, similar prior. IMPRESSION: Similar appearance of the chest x-ray, with no pneumothorax or confluent airspace disease. Unchanged gastric tube, left IJ central line, and endotracheal tube. Electronically Signed   By: Gilmer Mor D.O.   On: 10/23/2016 09:49   ASSESSMENT AND PLAN:   [name] is a 66 y.o. black male Asthma/COPD, hypertension, prior history of pneumonia, who was admitted to [facility] on 5/18/2018for evaluation of increasing shortness of breath. COPD exacerbation   # Acute on chronic respiratory failure with hypoxia due to COPD exacerbation and pneumonia Still needing full ventilatory support - Sedated Nebs IV steroids On fentanyl and precdex Unable to wean so far. May end up getting trach and PEG \ # Septic shock secondary to pneumonia Off pressors. Resolved   # COPD exacerbation due to acute bronchitis, continue  patient on levofloxacin, Pulmicort, DuoNeb's, prednisone, Spiriva, follow clinically   # Acute kidney injury due to ATN from hypotension Improving slowly  # chronic Tobacco abuse. Counseled  on admission  Case discussed with Care Management/Social Worker. Management plans discussed with the patient, family and they are in agreement.  CODE STATUS: FULL  DVT Prophylaxis: Lovenox  TOTAL TIME TAKING CARE OF THIS PATIENT: 25 minutes.  >50% time spent on counselling and coordination of care  POSSIBLE D/C IN  Few DAYS, DEPENDING ON CLINICAL CONDITION.  Note: This dictation was prepared with Dragon dictation along with smaller phrase technology. Any transcriptional errors that result from this process are unintentional.  Kevin Holden M.D on 10/24/2016 at 2:11 PM  Between 7am to 6pm - Pager - [phone]  After 6pm go to [email or web address] - Social research officer, government  [facility] [place]  Office  [phone]  CC: Primary care physician; Leanna Sato, MD

## 2016-10-24 NOTE — Progress Notes (Signed)
PULMONARY / CRITICAL CARE MEDICINE   Name: Kevin BurrowJoseph Holden MRN: [id number] DOB: [birth year]    ADMISSION DATE:  10/15/2016    PT PROFILE:   66 y.o. male smoker intubated after failing BiPAP with diagnosis of AECOPD  SUBJ: No significant change in status over the last 24 hours. Overall no improvement in respiratory status  VITAL SIGNS: BP 101/69   Pulse 95   Temp 98.9 F (37.2 C) (Oral)   Resp 14   Ht 6' (1.829 m)   Wt 59.8 kg (131 lb 13.4 oz)   SpO2 93%   BMI 17.88 kg/m   VENTILATOR SETTINGS: Vent Mode: PRVC FiO2 (%):  [30 %] 30 % Set Rate:  [14 bmp] 14 bmp Vt Set:  [500 mL] 500 mL PEEP:  [5 cmH20] 5 cmH20  INTAKE / OUTPUT: I/O last 3 completed shifts: In: 4290 [I.V.:658; Other:1950; NG/GT:1682] Out: 750 [Urine:750]  PHYSICAL EXAMINATION: Gen: acutely ill frail appearing [name], mechanically intubated  Neuro: not following commands, withdraws from painful stimulation, PERRL HEENT: supple, JVD present  Lungs: distant breath sounds with very prolonged exp phase, scattered distant wheezes, labored   Cardiovascular: nsr, s1s2, no M/R/G Abdomen: soft, +BS x4, soft, non tender, non distended  Musculoskeletal: normal tone, 1+ bilateral lower extremity edema Skin: intact no rashes or lesions  LABS:  BMET  Recent Labs Lab 10/20/16 0405 10/21/16 0421 10/22/16 0447 10/23/16 0436  NA 143 145  --  143  K 4.5 4.2  --  4.7  CL 101 99*  --  95*  CO2 37* 40*  --  43*  BUN 30* 24*  --  21*  CREATININE 0.82 0.87 0.75 0.74  GLUCOSE 226* 167*  --  145*    Electrolytes  Recent Labs Lab 10/18/16 0659  10/20/16 0405 10/21/16 0421 10/23/16 0436  CALCIUM 8.2*  < > 8.6* 9.3 9.3  MG 2.5*  --  2.1 2.3  --   PHOS 4.5  --  2.4* 2.4*  --   < > = values in this interval not displayed.  CBC  Recent Labs Lab 10/20/16 0405 10/21/16 0421 10/23/16 0436  WBC 8.0 8.6 12.0*  HGB 11.9* 12.5* 12.5*  HCT 36.7* 38.8* 38.2*  PLT 170 209 226    ABG No results for input(s):  PHART, PCO2ART, PO2ART in the last 168 hours.  CXR: NSC severe hyperinflation, no acute findings  ASSESSMENT / PLAN:  Ventilator dependent respiratory failure. Patient has not made any significant improvement in overall status. Presently on albuterol, Atrovent, budesonide, Solu-Medrol. Discussed with family yesterday they wish tracheostomy next week if no improvement.  Renal insufficiency. BUN/creatinine have been stable  Leukocytosis. Slight increase but no clinical evidence of active infection   Goals of care: 3 daughters 10/22/16 regarding goals of treatment they would like to proceed with tracheostomy placement if indicated.  However, in the event the pt were to cardiac arrest they WOULD NOT want pt to receive CPR, ACLS medications, or Defibrillation or Cardioversion.  Therefore, pts code status changed to Limited Code stating pt may be intubated/mechanically ventilated and the use of NIPPV/Bipap would be ok if indicated.  Critical care time 35 minutes. Ongoing respiratory failure on mechanical ventilation  Kevin KindredJohn Johanan Skorupski, DO

## 2016-10-25 LAB — GLUCOSE, CAPILLARY
Glucose-Capillary: 116 mg/dL — ABNORMAL HIGH (ref 65–99)
Glucose-Capillary: 153 mg/dL — ABNORMAL HIGH (ref 65–99)
Glucose-Capillary: 156 mg/dL — ABNORMAL HIGH (ref 65–99)
Glucose-Capillary: 165 mg/dL — ABNORMAL HIGH (ref 65–99)
Glucose-Capillary: 94 mg/dL (ref 65–99)

## 2016-10-25 MED ORDER — METHYLPREDNISOLONE SODIUM SUCC 125 MG IJ SOLR
60.0000 mg | Freq: Two times a day (BID) | INTRAMUSCULAR | Status: DC
  Administered 2016-10-25 – 2016-10-28 (×7): 60 mg via INTRAVENOUS
  Filled 2016-10-25 (×7): qty 2

## 2016-10-25 MED ORDER — METOPROLOL TARTRATE 5 MG/5ML IV SOLN
5.0000 mg | INTRAVENOUS | Status: AC
  Administered 2016-10-25: 5 mg via INTRAVENOUS
  Filled 2016-10-25: qty 5

## 2016-10-25 MED ORDER — HYDRALAZINE HCL 20 MG/ML IJ SOLN
10.0000 mg | INTRAMUSCULAR | Status: DC | PRN
  Administered 2016-10-26: 20 mg via INTRAVENOUS
  Filled 2016-10-25: qty 2

## 2016-10-25 NOTE — Progress Notes (Signed)
PULMONARY / CRITICAL CARE MEDICINE   Name: Kevin Holden MRN: [id number] DOB: 1951/04/13    ADMISSION DATE:  10/15/2016    PT PROFILE:   66 y.o. male smoker intubated after failing BiPAP with diagnosis of AECOPD  MAJOR EVENTS/TEST RESULTS: 05/18 Admitted by Hospitalist service with acute on chronic respiratory failure deemed due to AECOPD 05/18 Echo:The estimated ejection   fraction was in the range of 55% to 60%. Wall motion was normal. Doppler parameters are con sistent with abnormal left ventricular relaxation (grade 1 diastolic dysfunction).  05/19 Transferred to ICU/SDU for BiPAP. Failed BiPAP and intubated 05/20 Renal US: No cause for acute renal failure identified. Both kidneys are relatively small, measuring less than 9 cm  05/21 very agitated on WUA. No weaning 05/22 Very agitated on WUA. Dexmedetomidine initiated 05/23 increased airflow obstruction. Precedex changed to propofol. Continued problems with agitation on WUA 05/24 persistent severe airflow obstruction. PSV as tolerated. 05/25 family meeting held with pts 3 daughters to discuss goals of care pts daughters stated they would like to proceed with tracheostomy and peg tube placement if indicated. They also wanted to change pts code status to limited code to include NO CPR, ACLS medications, or defibrillation and/or cardioversion 5/28 Met with family, discussed status and plan, continue to want trach.   INDWELLING DEVICES:: ETT 05/19 >>  L IJ CVL 05/19 >>   MICRO DATA: MRSA PCR 05/19 >> NEG Urine 05/20 >> NEG  ANTIMICROBIALS:  Levofloxacin 05/19 >> 05/22 Ceftriaxone 05/22 >>05/25  SUBJ: Pt sedated mechanically intubated  VITAL SIGNS: BP 135/80   Pulse (!) 110   Temp 98.9 F (37.2 C) (Axillary)   Resp 16   Ht 6' (1.829 m)   Wt 59.8 kg (131 lb 13.4 oz)   SpO2 96%   BMI 17.88 kg/m   HEMODYNAMICS:    VENTILATOR SETTINGS: Vent Mode: PRVC FiO2 (%):  [28 %-30 %] 28 % Set Rate:  [14 bmp] 14 bmp Vt Set:   [500 mL] 500 mL PEEP:  [5 cmH20] 5 cmH20  INTAKE / OUTPUT: I/O last 3 completed shifts: In: 1815 [I.V.:358; Other:575; NG/GT:882] Out: 1500 [Urine:750; Emesis/NG output:750]  PHYSICAL EXAMINATION: Gen: acutely ill frail appearing [name], mechanically intubated  Neuro: not following commands, withdraws from painful stimulation, PERRL HEENT: supple, no JVD present  Lungs: rhonchi and diminished throughout with very prolonged exp phase, non labored Cardiovascular: nsr, s1s2, no M/R/G Abdomen: soft, +BS x4, soft, non tender, non distended  Musculoskeletal: normal tone, 1+ bilateral lower extremity edema Skin: intact no rashes or lesions  LABS:  BMET  Recent Labs Lab 10/20/16 0405 10/21/16 0421 10/22/16 0447 10/23/16 0436  NA 143 145  --  143  K 4.5 4.2  --  4.7  CL 101 99*  --  95*  CO2 37* 40*  --  43*  BUN 30* 24*  --  21*  CREATININE 0.82 0.87 0.75 0.74  GLUCOSE 226* 167*  --  145*    Electrolytes  Recent Labs Lab 10/18/16 0659  10/20/16 0405 10/21/16 0421 10/23/16 0436  CALCIUM 8.2*  < > 8.6* 9.3 9.3  MG 2.5*  --  2.1 2.3  --   PHOS 4.5  --  2.4* 2.4*  --   < > = values in this interval not displayed.  CBC  Recent Labs Lab 10/20/16 0405 10/21/16 0421 10/23/16 0436  WBC 8.0 8.6 12.0*  HGB 11.9* 12.5* 12.5*  HCT 36.7* 38.8* 38.2*  PLT 170 209 226  ABG No results for input(s): PHART, PCO2ART, PO2ART in the last 168 hours.  ASSESSMENT / PLAN:  PULMONARY A: Acute on chronic hypercarbic respiratory failure CXR film personally reviewed; shows continued Severe emphysema AECOPD Very severe airflow obstruction P:   Cont vent support - settings reviewed and/or adjusted Cont vent bundle Daily SBT if/when meets criteria Cont nebulized steroids and bronchodilators IV steroids weaned to 60 mg q12hr- continue Will likely need tracheostomy due to inability to wean off ventilator -- consult ENT on 5/29 CARDIOVASCULAR A:  Hypertension, controlled P:   Continuous telemetry monitoring Continue prn metoprolol and hydralazine   RENAL A:   Acute on chronic renal failure-resolved  P:   Monitor BMET intermittently Monitor I/Os Correct electrolytes as indicated  GASTROINTESTINAL A:   No acute issues P:   SUP: enteral famotidine Cont TF protocol  HEMATOLOGIC A:   MIld anemia without acute blood loss P:  DVT px: enoxaparin Monitor CBC intermittently Transfuse for hgb <7 Monitor for s/sx of bleeding   INFECTIOUS A:   Acute bronchitis P:   Monitor temp, WBC count Micro and abx as above   ENDOCRINE A:   DM 2 Steroid induced hyperglycemia P:   Cont SSI protocol  NEUROLOGIC A:   ICU/vent associated discomfort Acute encephalopathy Prognosis for functional recovery guarded P:   RASS goal: -1, -2 Continue fentanyl and propofol gtt to maintain RASS goal  Prn fentanyl for pain management  Cont PAD protocol WUA daily   -No family at bedside to update at this time 10/25/2016   [name], M.D.  10/25/2016

## 2016-10-25 NOTE — Plan of Care (Signed)
Problem: Physical Regulation: Goal: Will remain free from infection Outcome: Completed/Met Date Met: 10/25/16 Pt turned Q2H, sacral dressing in place, no s/sx impaired skin or skin breakdown

## 2016-10-25 NOTE — Care Management (Signed)
Case discussed with Dr. Ram. LTAC screening only requested from Erika with [facility] and [place] with [facility]. RNCM will continue to follow.  

## 2016-10-25 NOTE — Plan of Care (Signed)
Problem: Education: Goal: Knowledge of [facility] Education information/materials will improve Outcome: Completed/Met Date Met: 10/25/16 Family educated re disease process and tx, plan of care, goals of care, unit and patient room  Problem: Safety: Goal: Ability to remain free from injury will improve Outcome: Completed/Met Date Met: 10/25/16 Bed alarm on, pt near nurses' station  Problem: Pain Managment: Goal: General experience of comfort will improve Outcome: Completed/Met Date Met: 10/25/16 Pt with no s/sx discomfort  Problem: Physical Regulation: Goal: Ability to maintain clinical measurements within normal limits will improve Outcome: Completed/Met Date Met: 10/25/16 Pt exhibits no s/sx pain

## 2016-10-25 NOTE — Progress Notes (Signed)
[facility] - [place] at [facility]   PATIENT NAME: Kevin Holden    MR#:  [id number]  DATE OF BIRTH:  [birth year]  SUBJECTIVE:  Remains intubated and on the vent Patient on fentanyl and propofol No successful weaning REVIEW OF SYSTEMS:   Review of Systems  Unable to perform ROS: Intubated   Tolerating Diet: TF Tolerating PT: on the vent  DRUG ALLERGIES:   Allergies  Allergen Reactions  . Aspirin Other (See Comments)    Unknown  . Celecoxib Other (See Comments)    Reaction: Unknown  . Nsaids Other (See Comments)    Reaction: Unknown    VITALS:  Blood pressure (!) 82/58, pulse 84, temperature 98.6 F (37 C), temperature source Axillary, resp. rate 19, height 6' (1.829 m), weight 59.8 kg (131 lb 13.4 oz), SpO2 97 %.  PHYSICAL EXAMINATION:   Physical Exam  GENERAL:  66 y.o.-year-old patient lying in the bed with no acute distress. Critically ill EYES: Pupils equal, round, reactive to light and accommodation. No scleral icterus. Extraocular muscles intact.  HEENT: Head atraumatic, normocephalic. Oropharynx and nasopharynx clear. Intubated on the vent NECK:  Supple, no jugular venous distention. No thyroid enlargement, no tenderness.  LUNGS: Normal breath sounds bilaterally, no wheezing, rales, rhonchi. No use of accessory muscles of respiration.  CARDIOVASCULAR: S1, S2 normal. No murmurs, rubs, or gallops.  ABDOMEN: Soft, nontender, nondistended. Bowel sounds present. No organomegaly or mass.  EXTREMITIES: No cyanosis, clubbing or edema b/l.    NEUROLOGIC: vent  PSYCHIATRIC: on the vent SKIN: No obvious rash, lesion, or ulcer.   LABORATORY PANEL:  CBC  Recent Labs Lab 10/23/16 0436  WBC 12.0*  HGB 12.5*  HCT 38.2*  PLT 226    Chemistries   Recent Labs Lab 10/21/16 0421  10/23/16 0436  NA 145  --  143  K 4.2  --  4.7  CL 99*  --  95*  CO2 40*  --  43*  GLUCOSE 167*  --  145*  BUN 24*  --  21*  CREATININE 0.87  < > 0.74   CALCIUM 9.3  --  9.3  MG 2.3  --   --   < > = values in this interval not displayed. Cardiac Enzymes No results for input(s): TROPONINI in the last 168 hours. RADIOLOGY:  Dg Abd 1 View  Result Date: 10/24/2016 CLINICAL DATA:  Follow-up ileus, on vent EXAM: ABDOMEN - 1 VIEW COMPARISON:  10/21/2016 and 10/16/2016 FINDINGS: No evidence of bowel obstruction or ileus. Enteric tube terminates in the gastric cardia. Mild degenerative changes of the lower lumbar spine. Right iliac stent. IMPRESSION: No evidence of bowel obstruction or ileus. Electronically Signed   By: Charline Bills M.D.   On: 10/24/2016 09:23   Dg Chest Port 1 View  Result Date: 10/24/2016 CLINICAL DATA:  On ventilator. EXAM: PORTABLE CHEST 1 VIEW COMPARISON:  Radiographs Oct 23, 2016. FINDINGS: The heart size and mediastinal contours are within normal limits. Both lungs are clear. No pneumothorax or pleural effusion is noted. Hyperexpansion of the lungs is noted. Endotracheal and nasogastric tubes are unchanged in position. Left internal jugular catheter is noted with distal tip in expected position the SVC. The visualized skeletal structures are unremarkable. IMPRESSION: Hyperexpansion the lungs suggesting chronic obstructive pulmonary disease. Stable support apparatus. No acute abnormality seen. Electronically Signed   By: Lupita Raider, M.D.   On: 10/24/2016 08:57   ASSESSMENT AND PLAN:   Kevin Holden is a 66  y.o. black male Asthma/COPD, hypertension, prior history of pneumonia, who was admitted to [facility] on 5/18/2018for evaluation of increasing shortness of breath. COPD exacerbation   # Acute on chronic respiratory failure with hypoxia due to COPD exacerbation and pneumonia Still needing full ventilatory support - Sedated Nebs IV steroids On fentanyl and precdex Unable to wean so far. May end up getting trach and PEG LTACH referral made \ # Septic shock secondary to pneumonia Off pressors. Resolved   # COPD  exacerbation due to acute bronchitis, continue  patient on levofloxacin, Pulmicort, DuoNeb's, prednisone, Spiriva, follow clinically   # Acute kidney injury due to ATN from hypotension Improving slowly  # chronic Tobacco abuse.  Spoke with dter in the room  Case discussed with Care Management/Social Worker. Management plans discussed with the patient, family and they are in agreement.  CODE STATUS: FULL  DVT Prophylaxis: Lovenox  TOTAL TIME TAKING CARE OF THIS PATIENT: 20 minutes.  >50% time spent on counselling and coordination of care  POSSIBLE D/C IN  Few DAYS, DEPENDING ON CLINICAL CONDITION.  Note: This dictation was prepared with Dragon dictation along with smaller phrase technology. Any transcriptional errors that result from this process are unintentional.  Elianah Karis M.D on 10/25/2016 at 3:32 PM  Between 7am to 6pm - Pager - [phone]  After 6pm go to [email or web address] - Social research officer, governmentpassword EPAS [facility]  [facility] [place] [facility]  Office  [phone]  CC: Primary care physician; Leanna SatoMiles, Linda M, MD

## 2016-10-26 ENCOUNTER — Inpatient Hospital Stay: Payer: Medicare Other

## 2016-10-26 LAB — CBC
HEMATOCRIT: 36.8 % — AB (ref 40.0–52.0)
HEMOGLOBIN: 12.2 g/dL — AB (ref 13.0–18.0)
MCH: 27.3 pg (ref 26.0–34.0)
MCHC: 33.2 g/dL (ref 32.0–36.0)
MCV: 82 fL (ref 80.0–100.0)
Platelets: 235 10*3/uL (ref 150–440)
RBC: 4.49 MIL/uL (ref 4.40–5.90)
RDW: 14.9 % — AB (ref 11.5–14.5)
WBC: 12.8 10*3/uL — AB (ref 3.8–10.6)

## 2016-10-26 LAB — TRIGLYCERIDES: TRIGLYCERIDES: 77 mg/dL (ref <150)

## 2016-10-26 LAB — BASIC METABOLIC PANEL
ANION GAP: 7 (ref 5–15)
BUN: 27 mg/dL — ABNORMAL HIGH (ref 6–20)
CALCIUM: 9 mg/dL (ref 8.9–10.3)
CHLORIDE: 95 mmol/L — AB (ref 101–111)
CO2: 41 mmol/L — AB (ref 22–32)
Creatinine, Ser: 0.77 mg/dL (ref 0.61–1.24)
GFR calc Af Amer: >60 mL/min (ref >60)
GFR calc non Af Amer: >60 mL/min (ref >60)
GLUCOSE: 127 mg/dL — AB (ref 65–99)
POTASSIUM: 4.5 mmol/L (ref 3.5–5.1)
Sodium: 143 mmol/L (ref 135–145)

## 2016-10-26 LAB — BLOOD GAS, ARTERIAL
ACID-BASE EXCESS: 20.9 mmol/L — AB (ref 0.0–2.0)
Bicarbonate: 48.4 mmol/L — ABNORMAL HIGH (ref 20.0–28.0)
FIO2: 0.28
LHR: 14 {breaths}/min
MECHVT: 500 mL
Mechanical Rate: 14
O2 SAT: 91.2 %
PEEP/CPAP: 5 cmH2O
PH ART: 7.48 — AB (ref 7.350–7.450)
PO2 ART: 57 mmHg — AB (ref 83.0–108.0)
Patient temperature: 37
pCO2 arterial: 65 mmHg — ABNORMAL HIGH (ref 32.0–48.0)

## 2016-10-26 LAB — GLUCOSE, CAPILLARY
Glucose-Capillary: 117 mg/dL — ABNORMAL HIGH (ref 65–99)
Glucose-Capillary: 125 mg/dL — ABNORMAL HIGH (ref 65–99)
Glucose-Capillary: 127 mg/dL — ABNORMAL HIGH (ref 65–99)
Glucose-Capillary: 146 mg/dL — ABNORMAL HIGH (ref 65–99)
Glucose-Capillary: 152 mg/dL — ABNORMAL HIGH (ref 65–99)
Glucose-Capillary: 176 mg/dL — ABNORMAL HIGH (ref 65–99)
Glucose-Capillary: 185 mg/dL — ABNORMAL HIGH (ref 65–99)
Glucose-Capillary: 189 mg/dL — ABNORMAL HIGH (ref 65–99)

## 2016-10-26 NOTE — Progress Notes (Signed)
Pts daughter Britta Mccreedy(Barbara) approached me and asked for Mr. Mahrt's code status to be changed from partial code to full code. Pts daughter stated that she asked her father if he wanted us to do everything possible if he were to go into cardiac arrest and that he indicated that he did. MD notified. MD called the eldest daughter, Julieanne CottonJosephine, to ask if she wanted the code status to be changed and she stated she did not.

## 2016-10-26 NOTE — Care Management (Signed)
RNCM met with daughter's Edwyna Shell and Diego Cory in family waiting room regarding LTAC opportunity. I provided them with information on [facility] in [place] and in [place] and [place]. [facility] appears to be more convenient however Pamala Hurry will call RNCM back with decision today and indicated that would be today. RNCM will updated LTAC she chooses and insurance authorization will be started. RNCM will continue to follow.

## 2016-10-26 NOTE — Progress Notes (Signed)
Nutrition Follow-up  DOCUMENTATION CODES:   Severe malnutrition in context of chronic illness  INTERVENTION:  1. Provide V1.2 @ 8745mL/hr w/ Propofol @ 9.633mL/hr and 200cc free water Q8H - provides: 1542 calories, 81gm protein, 1476cc free water  NUTRITION DIAGNOSIS:   Malnutrition related to catabolic illness as evidenced by severe depletion of muscle mass, severe depletion of body fat. -ongoing  GOAL:   Patient will meet greater than or equal to 90% of their needs -not meeting  MONITOR:   PO intake, Supplement acceptance, Labs, Weight trends  REASON FOR ASSESSMENT:   Other (Comment) (low BMI)    ASSESSMENT:   66 year old male with h/o HTN and COPD admitted for acute on chronic respiratory failure with hypoxemia.  Patient is currently intubated on ventilator support MV: 7.7 L/min Temp (24hrs), Avg:98.5 F (36.9 C), Min:98.3 F (36.8 C), Max:98.9 F (37.2 C) Propofol: 9.3 ml/hr --> 246 calories Discussed in Rounds Needs re-estimated Labs and medications reviewed: CBGs 146-189 MVI liquid, Miralax/Glycolax Fentanyl gtt  Diet Order:     Skin:  Reviewed, no issues  Last BM:  5/17  Height:   Ht Readings from Last 1 Encounters:  10/16/16 6' (1.829 m)    Weight:   Wt Readings from Last 1 Encounters:  10/23/16 131 lb 13.4 oz (59.8 kg)    Ideal Body Weight:  80.9 kg  BMI:  Body mass index is 17.88 kg/m.  Estimated Nutritional Needs:   Kcal:  1608 calories  Protein:  72-90 grams  Fluid:  Per MD/NP/PA  EDUCATION NEEDS:   Education needs no appropriate at this time  Kevin AnoWilliam M. Yajaira Doffing, MS, RD LDN Inpatient Clinical Dietitian Pager [phone]

## 2016-10-26 NOTE — Progress Notes (Signed)
Chaplain received a page to visit with pt family in room IC15. Family members were encouraged that Pt was doing better. Chaplain provided emotional and grief support as well as a spiritual presence.    10/26/16 1609  Clinical Encounter Type  Visited With Patient;Patient and family together  Visit Type Initial;Spiritual support  Referral From Nurse  Consult/Referral To Chaplain  Spiritual Encounters  Spiritual Needs Emotional;Grief support

## 2016-10-26 NOTE — Progress Notes (Signed)
[facility] [place] [facility] [place]   PATIENT NAME: Kevin Holden    MR#:  [id number]  DATE OF BIRTH:  1951/05/10  SUBJECTIVE:  Remains intubated and on the vent Patient on fentanyl and propofol No successful weaning REVIEW OF SYSTEMS:   Review of Systems  Unable to perform ROS: Intubated   Tolerating Diet: TF Tolerating PT: on the vent  DRUG ALLERGIES:   Allergies  Allergen Reactions  . Aspirin Other (See Comments)    Unknown  . Celecoxib Other (See Comments)    Reaction: Unknown  . Nsaids Other (See Comments)    Reaction: Unknown    VITALS:  Blood pressure (!) 175/89, pulse (!) 130, temperature 98.5 F (36.9 C), temperature source Oral, resp. rate 15, height 6' (1.829 m), weight 59.8 kg (131 lb 13.4 oz), SpO2 100 %.  PHYSICAL EXAMINATION:   Physical Exam  GENERAL:  66 y.o.-year-old patient lying in the bed with no acute distress. Critically ill EYES: Pupils equal, round, reactive to light and accommodation. No scleral icterus. Extraocular muscles intact.  HEENT: Head atraumatic, normocephalic. Oropharynx and nasopharynx clear. Intubated on the vent NECK:  Supple, no jugular venous distention. No thyroid enlargement, no tenderness.  LUNGS: Normal breath sounds bilaterally, no wheezing, rales, rhonchi. No use of accessory muscles of respiration.  CARDIOVASCULAR: S1, S2 normal. No murmurs, rubs, or gallops.  ABDOMEN: Soft, nontender, nondistended. Bowel sounds present. No organomegaly or mass.  EXTREMITIES: No cyanosis, clubbing or edema b/l.    NEUROLOGIC: vent  PSYCHIATRIC: on the vent SKIN: No obvious rash, lesion, or ulcer.   LABORATORY PANEL:  CBC  Recent Labs Lab 10/26/16 0417  WBC 12.8*  HGB 12.2*  HCT 36.8*  PLT 235    Chemistries   Recent Labs Lab 10/21/16 0421  10/26/16 0417  NA 145  < > 143  K 4.2  < > 4.5  CL 99*  < > 95*  CO2 40*  < > 41*  GLUCOSE 167*  < > 127*  BUN 24*  < > 27*  CREATININE 0.87  < > 0.77   CALCIUM 9.3  < > 9.0  MG 2.3  --   --   < > = values in this interval not displayed. Cardiac Enzymes No results for input(s): TROPONINI in the last 168 hours. RADIOLOGY:  Dg Chest 1 View  Result Date: 10/26/2016 CLINICAL DATA:  Dyspnea, history asthma, COPD, hypertension EXAM: CHEST 1 VIEW COMPARISON:  Portable exam 0526 hours compared to 10/24/2016 FINDINGS: Tip of endotracheal tube projects 7.1 cm above carina. Nasogastric tube extends into stomach though the proximal side-port projects over the distal esophagus. LEFT jugular central venous catheter with tip projecting over SVC. Normal heart size, mediastinal contours, and pulmonary vascularity. Lungs hyperinflated but clear. Minimal central peribronchial thickening. No pleural effusion or pneumothorax. IMPRESSION: COPD changes without acute infiltrate. Advanced nasogastric tube 5 cm to place proximal side-port within the stomach. Electronically Signed   By: Ulyses Southward M.D.   On: 10/26/2016 06:56   ASSESSMENT AND PLAN:   [name] is a 66 y.o. black male Asthma/COPD, hypertension, prior history of pneumonia, who was admitted to [facility] on 5/18/2018for evaluation of increasing shortness of breath. COPD exacerbation   # Acute on chronic respiratory failure with hypoxia due to COPD exacerbation and pneumonia Still needing full ventilatory support - Sedated Nebs IV steroids On fentanyl and precdex Unable to wean so far. May end up getting trach and PEG LTACH referral made  #  Septic shock secondary to pneumonia Off pressors. Resolved   # COPD exacerbation due to acute bronchitis, continue  patient on levofloxacin, Pulmicort, DuoNeb's, prednisone, Spiriva, follow clinically   # Acute kidney injury due to ATN from hypotension Improving slowly  # chronic Tobacco abuse.   Case discussed with Care Management/Social Worker. Management plans discussed with the patient, family and they are in agreement.  CODE STATUS: FULL  DVT  Prophylaxis: Lovenox  TOTAL TIME TAKING CARE OF THIS PATIENT: 20 minutes.  >50% time spent on counselling and coordination of care  POSSIBLE D/C IN  Few DAYS, DEPENDING ON CLINICAL CONDITION.  Note: This dictation was prepared with Dragon dictation along with smaller phrase technology. Any transcriptional errors that result from this process are unintentional.  Altamese DillingVACHHANI, Yaslin Kirtley M.D on 10/26/2016 at 1:47 PM  Between 7am to 6pm - Pager - [phone]  After 6pm go to [email or web address] - Social research officer, governmentpassword EPAS [facility]  [facility] [place] [facility]  Office  [phone]  CC: Primary care physician; Leanna SatoMiles, Linda M, MD

## 2016-10-26 NOTE — Progress Notes (Signed)
  Advance Care Planning:  Spoke with the patient's daughter, [name] requested that the patient's CODE STATUS be changed to full code as she noted that her father is more awake this morning and she is enthused about his chances for recovery. Explained that the patient's eldest daughter, Julieanne CottonJosephine, would need to confirm her wishes, as she would be the patient's legal representative. I spoke with Julieanne CottonJosephine regarding the patient's CODE STATUS, explained that if the patient did receive a tracheostomy, his chances of meaningful recovery would be approximately 1/3. However, if he would go into cardiac arrest, I explained that this chance would be nearly 0. She therefore decided to maintain the patient as DO NOT RESUSCITATE, but continue current care measures, and proceed with tracheostomy as discussed previously.  Wells Guiles-Deep Ollie Esty, M.D. 10/26/2016

## 2016-10-26 NOTE — Progress Notes (Signed)
PULMONARY / CRITICAL CARE MEDICINE   Name: Kevin Holden MRN: [id number] DOB: 05/10/1951    ADMISSION DATE:  10/15/2016    PT PROFILE:   66 y.o. male smoker intubated after failing BiPAP with diagnosis of AECOPD  MAJOR EVENTS/TEST RESULTS: 05/18 Admitted by Hospitalist service with acute on chronic respiratory failure deemed due to AECOPD 05/18 Echo:The estimated ejection   fraction was in the range of 55% to 60%. Wall motion was normal. Doppler parameters are con sistent with abnormal left ventricular relaxation (grade 1 diastolic dysfunction).  05/19 Transferred to ICU/SDU for BiPAP. Failed BiPAP and intubated 05/20 Renal US: No cause for acute renal failure identified. Both kidneys are relatively small, measuring less than 9 cm  05/21 very agitated on WUA. No weaning 05/22 Very agitated on WUA. Dexmedetomidine initiated 05/23 increased airflow obstruction. Precedex changed to propofol. Continued problems with agitation on WUA 05/24 persistent severe airflow obstruction. PSV as tolerated. 05/25 family meeting held with pts 3 daughters to discuss goals of care pts daughters stated they would like to proceed with tracheostomy and peg tube placement if indicated. They also wanted to change pts code status to limited code to include NO CPR, ACLS medications, or defibrillation and/or cardioversion 5/28 Met with family, discussed status and plan, continue to want trach.   INDWELLING DEVICES:: ETT 05/19 >>  L IJ CVL 05/19 >>   MICRO DATA: MRSA PCR 05/19 >> NEG Urine 05/20 >> NEG  ANTIMICROBIALS:  Levofloxacin 05/19 >> 05/22 Ceftriaxone 05/22 >>05/25  REVIEW OF SYSTEMS: Unable to assess pt mechanically intubated  SUBJ: Pt sedated mechanically intubated  VITAL SIGNS: BP (!) 89/63   Pulse 95   Temp 98.3 F (36.8 C) (Axillary)   Resp 18   Ht 6' (1.829 m)   Wt 59.8 kg (131 lb 13.4 oz)   SpO2 96%   BMI 17.88 kg/m   HEMODYNAMICS:    VENTILATOR SETTINGS: Vent Mode:  PRVC FiO2 (%):  [28 %] 28 % Set Rate:  [14 bmp] 14 bmp Vt Set:  [500 mL] 500 mL PEEP:  [5 cmH20] 5 cmH20  INTAKE / OUTPUT: I/O last 3 completed shifts: In: 2877.9 [I.V.:1032.9; NG/GT:1845] Out: 3200 [Urine:2450; Emesis/NG output:750]  PHYSICAL EXAMINATION: Gen: acutely ill frail appearing [name], mechanically intubated  Neuro: sedated not following commands, withdraws from painful stimulation, PERRL HEENT: supple, no JVD present  Lungs: rhonchi and diminished throughout with very prolonged exp phase, non labored Cardiovascular: sinus tach, s1s2, no M/R/G Abdomen: soft, +BS x4, soft, non tender, non distended  Musculoskeletal: normal tone, 1+ bilateral lower extremity edema Skin: intact no rashes or lesions  LABS:  BMET  Recent Labs Lab 10/21/16 0421 10/22/16 0447 10/23/16 0436 10/26/16 0417  NA 145  --  143 143  K 4.2  --  4.7 4.5  CL 99*  --  95* 95*  CO2 40*  --  43* 41*  BUN 24*  --  21* 27*  CREATININE 0.87 0.75 0.74 0.77  GLUCOSE 167*  --  145* 127*    Electrolytes  Recent Labs Lab 10/20/16 0405 10/21/16 0421 10/23/16 0436 10/26/16 0417  CALCIUM 8.6* 9.3 9.3 9.0  MG 2.1 2.3  --   --   PHOS 2.4* 2.4*  --   --     CBC  Recent Labs Lab 10/21/16 0421 10/23/16 0436 10/26/16 0417  WBC 8.6 12.0* 12.8*  HGB 12.5* 12.5* 12.2*  HCT 38.8* 38.2* 36.8*  PLT 209 226 235    ABG No results  for input(s): PHART, PCO2ART, PO2ART in the last 168 hours.  ASSESSMENT / PLAN:  PULMONARY A: Acute on chronic hypercarbic respiratory failure CXR film personally reviewed; shows continued Severe emphysema AECOPD Very severe airflow obstruction P:   Cont vent support - settings reviewed and/or adjusted Cont vent bundle Daily SBT if/when meets criteria Cont nebulized steroids and bronchodilators IV steroids weaned to 60 mg q12hr- continue Will likely need tracheostomy due to inability to wean off ventilator -- consult ENT on 5/29  CARDIOVASCULAR A:   Hypertension, controlled P:  Continuous telemetry monitoring Continue prn metoprolol and hydralazine   RENAL A:   Acute on chronic renal failure-resolved  P:   Monitor BMET intermittently Monitor I/Os Correct electrolytes as indicated  GASTROINTESTINAL A:   No acute issues P:   SUP: enteral famotidine Cont TF protocol  HEMATOLOGIC A:   MIld anemia without acute blood loss P:  DVT px: enoxaparin Monitor CBC intermittently Transfuse for hgb <7 Monitor for s/sx of bleeding   INFECTIOUS A:   Acute bronchitis P:   Monitor temp, WBC count Micro and abx as above   ENDOCRINE A:   DM 2 Steroid induced hyperglycemia P:   Cont SSI protocol  NEUROLOGIC A:   ICU/vent associated discomfort Acute encephalopathy Prognosis for functional recovery guarded P:   RASS goal: -1, -2 Continue fentanyl and propofol gtt to maintain RASS goal  Prn fentanyl for pain management  Cont PAD protocol WUA daily   -No family at bedside to update at this time 10/26/2016    [name], [place] Pager [phone] (please enter 7 digits) PCCM Consult Pager [phone] (please enter 7 digits)  Patient seen and examined with NP, agree with findings, assessment, plan, as noted above. Patient is on the vent, lethargic, minimally arousable. Decreased air entry in both lung bases. I personally reviewed. Chest x-ray images, which shows no significant changes from previous. We'll continue current vent settings, the patient has not tolerated weaning. Plan for tracheostomy.  [name] M.D. 10/27/2016

## 2016-10-26 NOTE — Consult Note (Signed)
[name], Bryor [id number] 03/08/1951 Altamese DillingVachhani, Vaibhavkumar, *  Reason for Consult: Failure to extubate.  HPI: Admitted for exacerbation of COPD, required intubation on the 19th and has had difficulty weaning off the vent since.  ENT asked to place a tracheostomy  Allergies:  Allergies  Allergen Reactions  . Aspirin Other (See Comments)    Unknown  . Celecoxib Other (See Comments)    Reaction: Unknown  . Nsaids Other (See Comments)    Reaction: Unknown    ROS: Review of systems normal other than 12 systems except per HPI.  PMH:  Past Medical History:  Diagnosis Date  . Asthma   . COPD (chronic obstructive pulmonary disease) (HCC)   . HTN (hypertension)   . Pneumonia     FH: History reviewed. No pertinent family history.  SH:  Social History   Social History  . Marital status: Single    Spouse name: N/A  . Number of children: N/A  . Years of education: N/A   Occupational History  . Not on file.   Social History Main Topics  . Smoking status: Current Every Day Smoker    Packs/day: 1.00    Years: 40.00    Types: Cigarettes  . Smokeless tobacco: Never Used  . Alcohol use No  . Drug use: No  . Sexual activity: No   Other Topics Concern  . Not on file   Social History Narrative  . No narrative on file    PSH:  Past Surgical History:  Procedure Laterality Date  . ABDOMINAL SURGERY    . BACK SURGERY    . PERIPHERAL VASCULAR CATHETERIZATION Right 05/27/2016   Procedure: Lower Extremity Angiography;  Surgeon: Annice NeedyJason S Dew, MD;  Location: [facility] INVASIVE CV LAB;  Service: Cardiovascular;  Laterality: Right;  . PERIPHERAL VASCULAR CATHETERIZATION N/A 05/27/2016   Procedure: Abdominal Aortogram w/Lower Extremity;  Surgeon: Annice NeedyJason S Dew, MD;  Location: [facility] INVASIVE CV LAB;  Service: Cardiovascular;  Laterality: N/A;  . PERIPHERAL VASCULAR CATHETERIZATION  05/27/2016   Procedure: Lower Extremity Intervention;  Surgeon: Annice NeedyJason S Dew, MD;  Location: [facility] INVASIVE CV  LAB;  Service: Cardiovascular;;  . VASCULAR SURGERY     2016 balloon in vein in right leg    Physical  Exam: Intubated and sedated.  CN 2-12 grossly intact and symmetric. EAC/TMs normal BL. Oral cavity, lips, gums, ororpharynx normal with no masses or lesions. Skin warm and dry. Nasal cavity without polyps or purulence. External nose and ears without masses or lesions. EOMI, PERRLA. Neck supple with no masses or lesions. I could see no previous anterior neck scars.   No lymphadenopathy palpated. Thyroid normal with no masses.   A/P: Failure to extubate-will schedule tracheostomy asap.  Family not present tonight, if they have questions feel free to page me and I can answer them.  Will ask ICU team to obtain consent.     Percy Winterrowd T 10/26/2016 4:52 PM

## 2016-10-27 ENCOUNTER — Inpatient Hospital Stay: Payer: Medicare Other

## 2016-10-27 ENCOUNTER — Encounter: Payer: Self-pay | Admitting: Anesthesiology

## 2016-10-27 DIAGNOSIS — J449 Chronic obstructive pulmonary disease, unspecified: Secondary | ICD-10-CM

## 2016-10-27 LAB — GLUCOSE, CAPILLARY
Glucose-Capillary: 135 mg/dL — ABNORMAL HIGH (ref 65–99)
Glucose-Capillary: 96 mg/dL (ref 65–99)
Glucose-Capillary: 142 mg/dL — ABNORMAL HIGH (ref 65–99)
Glucose-Capillary: 135 mg/dL — ABNORMAL HIGH (ref 65–99)
Glucose-Capillary: 145 mg/dL — ABNORMAL HIGH (ref 65–99)
Glucose-Capillary: 168 mg/dL — ABNORMAL HIGH (ref 65–99)

## 2016-10-27 NOTE — Progress Notes (Signed)
[facility] - [place] at [facility]   PATIENT NAME: Kevin Holden    MR#:  [id number]  DATE OF BIRTH:  May 03, 1951  SUBJECTIVE:  Remains intubated and on the vent Patient on fentanyl and propofol No successful weaning- he is alert but intubated today. REVIEW OF SYSTEMS:   Review of Systems  Unable to perform ROS: Intubated   Tolerating Diet: TF Tolerating PT: on the vent  DRUG ALLERGIES:   Allergies  Allergen Reactions  . Aspirin Other (See Comments)    Unknown  . Celecoxib Other (See Comments)    Reaction: Unknown  . Nsaids Other (See Comments)    Reaction: Unknown    VITALS:  Blood pressure (!) 142/79, pulse (!) 114, temperature 99.5 F (37.5 C), temperature source Oral, resp. rate 14, height 6' (1.829 Holden), weight 59.8 kg (131 lb 13.4 oz), SpO2 92 %.  PHYSICAL EXAMINATION:   Physical Exam  GENERAL:  66 y.o.-year-old patient lying in the bed with no acute distress. Critically ill EYES: Pupils equal, round, reactive to light and accommodation. No scleral icterus. Extraocular muscles intact.  HEENT: Head atraumatic, normocephalic. Oropharynx and nasopharynx clear. Intubated on the vent NECK:  Supple, no jugular venous distention. No thyroid enlargement, no tenderness.  LUNGS: Normal breath sounds bilaterally, no wheezing, rales, rhonchi. No use of accessory muscles of respiration.  CARDIOVASCULAR: S1, S2 normal. No murmurs, rubs, or gallops.  ABDOMEN: Soft, nontender, nondistended. Bowel sounds present. No organomegaly or mass.  EXTREMITIES: No cyanosis, clubbing or edema b/l.    NEUROLOGIC: vent , alert, eyes open. PSYCHIATRIC: on the vent SKIN: No obvious rash, lesion, or ulcer.   LABORATORY PANEL:  CBC  Recent Labs Lab 10/26/16 0417  WBC 12.8*  HGB 12.2*  HCT 36.8*  PLT 235    Chemistries   Recent Labs Lab 10/21/16 0421  10/26/16 0417  NA 145  < > 143  K 4.2  < > 4.5  CL 99*  < > 95*  CO2 40*  < > 41*  GLUCOSE 167*  < >  127*  BUN 24*  < > 27*  CREATININE 0.87  < > 0.77  CALCIUM 9.3  < > 9.0  MG 2.3  --   --   < > = values in this interval not displayed. Cardiac Enzymes No results for input(s): TROPONINI in the last 168 hours. RADIOLOGY:  Dg Chest 1 View  Result Date: 10/27/2016 CLINICAL DATA:  respiratory failure. EXAM: CHEST 1 VIEW COMPARISON:  10/26/2016 and 10/24/2016 FINDINGS: Endotracheal tube and central venous catheter appear in good position. Tip of the NG tube is in the upper fundus of the stomach with the proximal side hole in the distal esophagus, unchanged. The heart size and pulmonary vascularity are normal and the lungs are clear. No effusions. No bone abnormality. IMPRESSION: No acute abnormalities.  No change since the prior study. Electronically Signed   By: Francene Boyers Holden.D.   On: 10/27/2016 10:02   Dg Chest 1 View  Result Date: 10/26/2016 CLINICAL DATA:  Dyspnea, history asthma, COPD, hypertension EXAM: CHEST 1 VIEW COMPARISON:  Portable exam 0526 hours compared to 10/24/2016 FINDINGS: Tip of endotracheal tube projects 7.1 cm above carina. Nasogastric tube extends into stomach though the proximal side-port projects over the distal esophagus. LEFT jugular central venous catheter with tip projecting over SVC. Normal heart size, mediastinal contours, and pulmonary vascularity. Lungs hyperinflated but clear. Minimal central peribronchial thickening. No pleural effusion or pneumothorax. IMPRESSION: COPD changes without acute  infiltrate. Advanced nasogastric tube 5 cm to place proximal side-port within the stomach. Electronically Signed   By: Ulyses SouthwardMark  Boles Holden.D.   On: 10/26/2016 06:56   ASSESSMENT AND PLAN:   [name] is a 66 y.o. black male Asthma/COPD, hypertension, prior history of pneumonia, who [facility]for evaluation of increasing shortness of breath. COPD exacerbation   # Acute on chronic respiratory failure with hypoxia due to COPD exacerbation and  pneumonia Still needing full ventilatory support - Sedated Nebs + IV steroids On fentanyl and precdex Unable to wean so far. Plan is for trach and PEG tomorrow. LTACH referral made  # Septic shock secondary to pneumonia Off pressors. Resolved   # COPD exacerbation due to acute bronchitis, continue  patient on levofloxacin, Pulmicort, DuoNeb's, prednisone, Spiriva, follow clinically   # Acute kidney injury due to ATN from hypotension Improving slowly  # chronic Tobacco abuse.   Case discussed with Care Management/Social Worker. Management plans discussed with the patient, family and they are in agreement.  CODE STATUS: FULL  DVT Prophylaxis: Lovenox  TOTAL TIME TAKING CARE OF THIS PATIENT: 20 minutes.  >50% time spent on counselling and coordination of care  POSSIBLE D/C IN  Few DAYS, DEPENDING ON CLINICAL CONDITION.  Note: This dictation was prepared with Dragon dictation along with smaller phrase technology. Any transcriptional errors that result from this process are unintentional.  Altamese DillingVACHHANI, Danijela Vessey Holden.D on 10/27/2016 at 9:04 PM  Between 7am to 6pm - Pager - [phone]  After 6pm go to [email or web address] - Social research officer, governmentpassword EPAS [facility]  [facility] [place] [facility]  Office  [phone]  CC: Primary care physician; Leanna SatoMiles, Linda M, MD

## 2016-10-27 NOTE — Consult Note (Signed)
Kevin Miniumarren [name], MD [facility]  [address] [place], [place] [address] Phone: [phone] Fax : [phone]  Consultation  Referring Provider:     Dr. Nicholos Johnsamachandran Primary Care Physician:  Leanna SatoMiles, Linda M, MD Primary Gastroenterologist:  Gentry FitzUnassigned      Reason for Consultation:     PEG tube placement  Date of Admission:  10/15/2016 Date of Consultation:  10/27/2016         HPI:   Tilda BurrowJoseph Holden is a 66 y.o. male who was admitted with COPD exacerbation and chronic respiratory failure. The patient was intubated after failing BiPAP. The ICU team has had a conversation with the patient's family about PEG tube placement and tracheostomy placement. The family had reported that they would like to proceed with this. The patient has been made DO NOT RESUSCITATE with no CPR ACLS meds or defibrillation/cardioversion. The patient's hemoglobin has been stable over the last week had about 12. His platelets are normal. The patient also has a history of abdominal surgery of unknown etiology. The patient is now intubated and unable to give any history.  Past Medical History:  Diagnosis Date  . Asthma   . COPD (chronic obstructive pulmonary disease) (HCC)   . HTN (hypertension)   . Pneumonia     Past Surgical History:  Procedure Laterality Date  . ABDOMINAL SURGERY    . BACK SURGERY    . PERIPHERAL VASCULAR CATHETERIZATION Right 05/27/2016   Procedure: Lower Extremity Angiography;  Surgeon: Annice NeedyJason S Dew, MD;  Location: [facility] INVASIVE CV LAB;  Service: Cardiovascular;  Laterality: Right;  . PERIPHERAL VASCULAR CATHETERIZATION N/A 05/27/2016   Procedure: Abdominal Aortogram w/Lower Extremity;  Surgeon: Annice NeedyJason S Dew, MD;  Location: [facility] INVASIVE CV LAB;  Service: Cardiovascular;  Laterality: N/A;  . PERIPHERAL VASCULAR CATHETERIZATION  05/27/2016   Procedure: Lower Extremity Intervention;  Surgeon: Annice NeedyJason S Dew, MD;  Location: [facility] INVASIVE CV LAB;  Service: Cardiovascular;;  . VASCULAR SURGERY     2016 balloon in vein in right leg    Prior to Admission medications   Medication Sig Start Date End Date Taking? Authorizing Provider  albuterol (PROVENTIL HFA;VENTOLIN HFA) 108 (90 Base) MCG/ACT inhaler Inhale 1-2 puffs into the lungs every 6 (six) hours as needed for wheezing or shortness of breath. 10/05/16 11/05/16 Yes Mody, Patricia PesaSital, MD  cholecalciferol (VITAMIN D) 1000 units tablet Take 1,000 Units by mouth daily.   Yes [provider]  Ferrous Gluconate (IRON 27 PO) Take 1 tablet by mouth daily.   Yes [provider]  furosemide (LASIX) 20 MG tablet Take 20 mg by mouth daily as needed for edema.  10/12/16  Yes [provider]  gabapentin (NEURONTIN) 300 MG capsule Take 900 mg by mouth 3 (three) times daily.    Yes [provider]  loratadine (CLARITIN) 10 MG tablet Take 10 mg by mouth daily.    Yes [provider]  methocarbamol (ROBAXIN) 500 MG tablet Take 500 mg by mouth every 6 (six) hours as needed for muscle spasms.   Yes [provider]  mometasone-formoterol (DULERA) 200-5 MCG/ACT AERO Inhale 2 puffs into the lungs 2 (two) times daily.   Yes [provider]  omeprazole (PRILOSEC) 20 MG capsule Take 20 mg by mouth daily.   Yes [provider]  tiotropium (SPIRIVA) 18 MCG inhalation capsule Place 18 mcg into inhaler and inhale daily.   Yes [provider]    History reviewed. No pertinent family history.   Social History  Substance Use Topics  . Smoking status: Current Every Day Smoker    Packs/day: 1.00    Years: 40.00    Types: Cigarettes  . Smokeless tobacco: Never Used  . Alcohol use No    Allergies as of 10/15/2016 - Review Complete 10/15/2016  Allergen Reaction Noted  . Aspirin Other (See Comments) 11/10/2011  . Celecoxib Other (See Comments) 08/16/2013  . Nsaids Other (See Comments) 08/16/2013    Review of Systems:    All systems reviewed and negative except where noted in HPI.    Physical Exam:  Vital signs in last 24 hours: Temp:  [97.5 F (36.4 C)-99.5 F (37.5 C)] 98.4 F (36.9 C) (05/30 1502) Pulse Rate:  [100-127] 110 (05/30 1500) Resp:  [12-25] 17 (05/30 1500) BP: (70-159)/(57-108) 132/93 (05/30 1500) SpO2:  [93 %-100 %] 96 % (05/30 1500) FiO2 (%):  [28 %] 28 % (05/30 1340) Last BM Date: 10/27/16 General:  Intubated and lethargic Head:  Normocephalic and atraumatic. Eyes:   No icterus.   Conjunctiva pink. PERRLA. Ears:  Normal auditory acuity. Neck:  Supple; no masses or thyroidomegaly Lungs: Patient is intubated on a respirator with decreased breath sounds throughout Heart:  Regular rate and rhythm;  Without murmur, clicks, rubs or gallops Abdomen:  Soft, nondistended, nontender. Normal bowel sounds. No appreciable masses or hepatomegaly.  No rebound or guarding. There is a midline laparotomy scar Rectal:  Not performed. Msk:  Symmetrical without gross deformities.   Extremities:  Without edema, cyanosis or clubbing. Neurologic:  Sedated and unable to assess neurological status Skin:  Intact without significant lesions or rashes. Cervical Nodes:  No significant cervical adenopathy. Psych:  Sedated and intubated.  LAB RESULTS:  Recent Labs  10/26/16 0417  WBC 12.8*  HGB 12.2*  HCT 36.8*  PLT 235   BMET  Recent Labs  10/26/16 0417  NA 143  K 4.5  CL 95*  CO2 41*  GLUCOSE 127*  BUN 27*  CREATININE 0.77  CALCIUM 9.0   LFT No results for input(s): PROT, ALBUMIN, AST, ALT, ALKPHOS, BILITOT, BILIDIR, IBILI in the last 72 hours. PT/INR No results for input(s): LABPROT, INR in the last 72 hours.  STUDIES: Dg Chest 1 View  Result Date: 10/27/2016 CLINICAL DATA:  respiratory failure. EXAM: CHEST 1 VIEW COMPARISON:  10/26/2016 and 10/24/2016 FINDINGS: Endotracheal tube and central venous catheter appear in good position. Tip of the NG tube is in the upper fundus of the stomach with the proximal side hole in the distal esophagus,  unchanged. The heart size and pulmonary vascularity are normal and the lungs are clear. No effusions. No bone abnormality. IMPRESSION: No acute abnormalities.  No change since the prior study. Electronically Signed   By: Francene Boyers M.D.   On: 10/27/2016 10:02   Dg Chest 1 View  Result Date: 10/26/2016 CLINICAL DATA:  Dyspnea, history asthma, COPD, hypertension EXAM: CHEST 1 VIEW COMPARISON:  Portable exam 0526 hours compared to 10/24/2016 FINDINGS: Tip of endotracheal tube projects 7.1 cm above carina. Nasogastric tube extends into stomach though the proximal side-port projects over the distal esophagus. LEFT jugular central venous catheter with tip projecting over SVC. Normal heart size, mediastinal contours, and pulmonary vascularity. Lungs hyperinflated but clear. Minimal central peribronchial thickening. No pleural effusion or pneumothorax. IMPRESSION: COPD changes without acute infiltrate. Advanced nasogastric tube 5 cm to place proximal side-port within the stomach. Electronically Signed   By: Ulyses Southward M.D.   On: 10/26/2016 06:56      Impression /  Plan:   Kevin Holden is a 66 y.o. y/o male with COPD exacerbation who is now on a ventilator and getting a tracheostomy tomorrow morning. The patient will be set up for a PEG tube for Friday.   Thank you for involving me in the care of this patient.      LOS: 12 days   Kevin Minium, MD  10/27/2016, 3:50 PM   Note: This dictation was prepared with Dragon dictation along with smaller phrase technology. Any transcriptional errors that result from this process are unintentional.

## 2016-10-27 NOTE — Care Management (Signed)
Late entry: This RNCM received call from patient's daughter [name] [facility]. I have notified [facility] with [facility] of this selection pending insurance approval.

## 2016-10-27 NOTE — Anesthesia Preprocedure Evaluation (Addendum)
Anesthesia Evaluation  Patient identified by MRN, date of birth, ID band Patient awake    Reviewed: Allergy & Precautions, H&P , NPO status , Patient's Chart, lab work & pertinent test results  History of Anesthesia Complications Negative for: history of anesthetic complications  Airway Mallampati: Intubated  TM Distance: >3 FB Neck ROM: limited    Dental  (+) Poor Dentition, Chipped   Pulmonary asthma , pneumonia, COPD, Current Smoker,    Pulmonary exam normal breath sounds clear to auscultation       Cardiovascular Exercise Tolerance: Poor hypertension, (-) angina+ Peripheral Vascular Disease  (-) Past MI Normal cardiovascular exam Rhythm:regular Rate:Normal     Neuro/Psych  Neuromuscular disease negative psych ROS   GI/Hepatic negative GI ROS, Neg liver ROS,   Endo/Other  negative endocrine ROS  Renal/GU      Musculoskeletal   Abdominal   Peds  Hematology negative hematology ROS (+)   Anesthesia Other Findings Past Medical History: No date: Asthma No date: COPD (chronic obstructive pulmonary disease) (* No date: HTN (hypertension) No date: Pneumonia  Past Surgical History: No date: ABDOMINAL SURGERY No date: BACK SURGERY 05/27/2016: PERIPHERAL VASCULAR CATHETERIZATION Right     Comment: Procedure: Lower Extremity Angiography;                Surgeon: Annice NeedyJason S Dew, MD;  Location: [facility]               INVASIVE CV LAB;  Service: Cardiovascular;                Laterality: Right; 05/27/2016: PERIPHERAL VASCULAR CATHETERIZATION N/A     Comment: Procedure: Abdominal Aortogram w/Lower               Extremity;  Surgeon: Annice NeedyJason S Dew, MD;                Location: [facility] INVASIVE CV LAB;  Service:               Cardiovascular;  Laterality: N/A; 05/27/2016: PERIPHERAL VASCULAR CATHETERIZATION     Comment: Procedure: Lower Extremity Intervention;                Surgeon: Annice NeedyJason S Dew, MD;  Location: [facility]   INVASIVE CV LAB;  Service: Cardiovascular;; No date: VASCULAR SURGERY     Comment: 2016 balloon in vein in right leg  BMI    Body Mass Index:  17.88 kg/m      Reproductive/Obstetrics negative OB ROS                             Anesthesia Physical Anesthesia Plan  ASA: IV  Anesthesia Plan: General ETT   Post-op Pain Management:    Induction: Intravenous  Airway Management Planned: Oral ETT  Additional Equipment:   Intra-op Plan:   Post-operative Plan: Post-operative intubation/ventilation  Informed Consent: I have reviewed the patients History and Physical, chart, labs and discussed the procedure including the risks, benefits and alternatives for the proposed anesthesia with the patient or authorized representative who has indicated his/her understanding and acceptance.   Dental Advisory Given  Plan Discussed with: Anesthesiologist, CRNA and Surgeon  Anesthesia Plan Comments: (Daughter Sherilyn DacostaBarbra Foucher consented by phone at [phone].  Plan to suspend DNR for the procedure.   Patient consented for risks of anesthesia including but not limited to:  - adverse reactions to medications - damage to teeth, lips or other oral mucosa - sore throat  or hoarseness - Damage to heart, brain, lungs or loss of life  Patient voiced understanding.)        Anesthesia Quick Evaluation

## 2016-10-27 NOTE — Progress Notes (Signed)
10/27/2016 6:07 PM  [name], [name] [id number]  Still intubated, sedated scheduled for tracheostomy tomorrow.    Temp:  [97.5 F (36.4 C)-99.5 F (37.5 C)] 98.4 F (36.9 C) (05/30 1502) Pulse Rate:  [100-123] 106 (05/30 1800) Resp:  [13-25] 16 (05/30 1800) BP: (70-157)/(57-108) 105/71 (05/30 1800) SpO2:  [92 %-100 %] 92 % (05/30 1800) FiO2 (%):  [28 %] 28 % (05/30 1600),     Intake/Output Summary (Last 24 hours) at 10/27/16 1807 Last data filed at 10/27/16 1700  Gross per 24 hour  Intake          1788.53 ml  Output             1850 ml  Net           -61.47 ml    Results for orders placed or performed during the hospital encounter of 10/15/16 (from the past 24 hour(s))  Glucose, capillary     Status: Abnormal   Collection Time: 10/26/16  7:59 PM  Result Value Ref Range   Glucose-Capillary 176 (H) 65 - 99 mg/dL   Comment 1 Notify RN    Comment 2 Document in Chart   Glucose, capillary     Status: Abnormal   Collection Time: 10/26/16 11:34 PM  Result Value Ref Range   Glucose-Capillary 185 (H) 65 - 99 mg/dL   Comment 1 Notify RN    Comment 2 Document in Chart   Glucose, capillary     Status: Abnormal   Collection Time: 10/27/16  4:12 AM  Result Value Ref Range   Glucose-Capillary 135 (H) 65 - 99 mg/dL   Comment 1 Notify RN    Comment 2 Document in Chart   Glucose, capillary     Status: None   Collection Time: 10/27/16  7:23 AM  Result Value Ref Range   Glucose-Capillary 96 65 - 99 mg/dL  Glucose, capillary     Status: Abnormal   Collection Time: 10/27/16 11:27 AM  Result Value Ref Range   Glucose-Capillary 142 (H) 65 - 99 mg/dL  Glucose, capillary     Status: Abnormal   Collection Time: 10/27/16 11:54 AM  Result Value Ref Range   Glucose-Capillary 135 (H) 65 - 99 mg/dL  Glucose, capillary     Status: Abnormal   Collection Time: 10/27/16  3:58 PM  Result Value Ref Range   Glucose-Capillary 145 (H) 65 - 99 mg/dL    SUBJECTIVE:  Intubated and  sedated  OBJECTIVE:  Exam unchanged  IMPRESSION:  Failure to extubate  PLAN:  Dr. Andee PolesVaught to perform tracheostomy tomorrow morning.  Tube feeds and anticoagulants to be stopped at midnight.    Seen as ICU patient, total time 30 mins including yesterday's visit.    Kevin Holden T 10/27/2016, 6:07 PM

## 2016-10-27 NOTE — Progress Notes (Signed)
PULMONARY / CRITICAL CARE MEDICINE   Name: Kevin Holden MRN: [id number] DOB: 1950-12-23    ADMISSION DATE:  10/15/2016    PT PROFILE:   66 y.o. male smoker intubated after failing BiPAP with diagnosis of AECOPD  MAJOR EVENTS/TEST RESULTS: 05/18 Admitted by Hospitalist service with acute on chronic respiratory failure deemed due to AECOPD 05/18 Echo:The estimated ejection   fraction was in the range of 55% to 60%. Wall motion was normal. Doppler parameters are con sistent with abnormal left ventricular relaxation (grade 1 diastolic dysfunction).  05/19 Transferred to ICU/SDU for BiPAP. Failed BiPAP and intubated 05/20 Renal US: No cause for acute renal failure identified. Both kidneys are relatively small, measuring less than 9 cm  05/21 very agitated on WUA. No weaning 05/22 Very agitated on WUA. Dexmedetomidine initiated 05/23 increased airflow obstruction. Precedex changed to propofol. Continued problems with agitation on WUA 05/24 persistent severe airflow obstruction. PSV as tolerated. 05/25 family meeting held with pts 3 daughters to discuss goals of care pts daughters stated they would like to proceed with tracheostomy and peg tube placement if indicated. They also wanted to change pts code status to limited code to include NO CPR, ACLS medications, or defibrillation and/or cardioversion 5/28 Met with family, discussed status and plan, continue to want trach.  5/29 Eldest daughter continues to want trach, confirms DNR status.   INDWELLING DEVICES:: ETT 05/19 >>  L IJ CVL 05/19 >>   MICRO DATA: MRSA PCR 05/19 >> NEG Urine 05/20 >> NEG  ANTIMICROBIALS:  Levofloxacin 05/19 >> 05/22 Ceftriaxone 05/22 >>05/25  REVIEW OF SYSTEMS: Unable to assess pt mechanically intubated  SUBJ: Pt sedated mechanically intubated  VITAL SIGNS: BP 125/87   Pulse (!) 119   Temp 97.5 F (36.4 C) (Axillary)   Resp (!) 25   Ht 6' (1.829 m)   Wt 131 lb 13.4 oz (59.8 kg)   SpO2 100%   BMI  17.88 kg/m   HEMODYNAMICS:    VENTILATOR SETTINGS: Vent Mode: PRVC FiO2 (%):  [28 %] 28 % Set Rate:  [14 bmp] 14 bmp Vt Set:  [500 mL-600 mL] 600 mL PEEP:  [5 cmH20] 5 cmH20  INTAKE / OUTPUT: I/O last 3 completed shifts: In: 2438.8 [I.V.:958.8; NG/GT:1480] Out: 3625 [Urine:3625]  PHYSICAL EXAMINATION: Gen: acutely ill frail appearing [name], mechanically intubated  Neuro: sedated not following commands, withdraws from painful stimulation, PERRL HEENT: supple, no JVD present  Lungs: rhonchi and diminished throughout with very prolonged exp phase, non labored Cardiovascular: sinus tach, s1s2, no M/R/G Abdomen: soft, +BS x4, soft, non tender, non distended  Musculoskeletal: normal tone, 1+ bilateral lower extremity edema Skin: intact no rashes or lesions  LABS:  BMET  Recent Labs Lab 10/21/16 0421 10/22/16 0447 10/23/16 0436 10/26/16 0417  NA 145  --  143 143  K 4.2  --  4.7 4.5  CL 99*  --  95* 95*  CO2 40*  --  43* 41*  BUN 24*  --  21* 27*  CREATININE 0.87 0.75 0.74 0.77  GLUCOSE 167*  --  145* 127*    Electrolytes  Recent Labs Lab 10/21/16 0421 10/23/16 0436 10/26/16 0417  CALCIUM 9.3 9.3 9.0  MG 2.3  --   --   PHOS 2.4*  --   --     CBC  Recent Labs Lab 10/21/16 0421 10/23/16 0436 10/26/16 0417  WBC 8.6 12.0* 12.8*  HGB 12.5* 12.5* 12.2*  HCT 38.8* 38.2* 36.8*  PLT 209 226 235  ABG  Recent Labs Lab 10/26/16 0430  PHART 7.48*  PCO2ART 65*  PO2ART 50*    ASSESSMENT / PLAN:  PULMONARY A: Acute on chronic hypercarbic respiratory failure CXR film personally reviewed; shows continued Severe emphysema AECOPD Very severe airflow obstruction P:   Cont vent support - settings reviewed and/or adjusted Cont vent bundle Daily SBT if/when meets criteria Cont nebulized steroids and bronchodilators IV steroids weaned to 60 mg q12hr- continue Will likely need tracheostomy due to inability to wean off ventilator -- consult ENT on  5/29  CARDIOVASCULAR A:  Hypertension, controlled P:  Continuous telemetry monitoring Continue prn metoprolol and hydralazine   RENAL A:   Acute on chronic renal failure-resolved  P:   Monitor BMET intermittently Monitor I/Os Correct electrolytes as indicated  GASTROINTESTINAL A:   No acute issues P:   SUP: enteral famotidine Cont TF protocol  HEMATOLOGIC A:   MIld anemia without acute blood loss P:  DVT px: enoxaparin Monitor CBC intermittently Transfuse for hgb <7 Monitor for s/sx of bleeding   INFECTIOUS A:   Acute bronchitis P:   Monitor temp, WBC count Micro and abx as above   ENDOCRINE A:   DM 2 Steroid induced hyperglycemia P:   Cont SSI protocol  NEUROLOGIC A:   ICU/vent associated discomfort Acute encephalopathy Prognosis for functional recovery guarded P:   RASS goal: -1, -2 Continue fentanyl and propofol gtt to maintain RASS goal  Prn fentanyl for pain management  Cont PAD protocol WUA daily   -No family at bedside to update at this time 10/26/2016    Kevin Holden, [place] Pager [phone] (please enter 7 digits) PCCM Consult Pager [phone] (please enter 7 digits)  Patient seen and examined with NP, agree with findings, assessment, plan, as noted above. Patient is on the vent, lethargic, minimally arousable. Decreased air entry in both lung bases. I personally reviewed. Chest x-ray images, which shows no significant changes from previous. We'll continue current vent settings, the patient has not tolerated weaning. Plan for tracheostomy.  Kevin Holden M.D. 10/27/2016   Critical Care Attestation.  I have personally obtained a history, examined the patient, evaluated laboratory and imaging results, formulated the assessment and plan and placed orders. The Patient requires high complexity decision making for assessment and support, frequent evaluation and titration of therapies, application of advanced  monitoring technologies and extensive interpretation of multiple databases. The patient has critical illness that could lead imminently to failure of 1 or more organ systems and requires the highest level of physician preparedness to intervene.  Critical Care Time devoted to patient care services described in this note is 35 minutes and is exclusive of time spent in procedures.

## 2016-10-27 NOTE — Progress Notes (Signed)
Per nursing team of Dr. Gregary CromerVaught's pt is to have tube feedings, flushes, and anticoagulants stopped after 0000 on 5/31. This is in preparation for tracheotomy placement that is scheduled for 0900 on 5/31. Orders placed in EPIC. Will pass on to oncoming nurse.

## 2016-10-28 ENCOUNTER — Inpatient Hospital Stay: Payer: Medicare Other

## 2016-10-28 ENCOUNTER — Inpatient Hospital Stay: Payer: Medicare Other | Admitting: Anesthesiology

## 2016-10-28 ENCOUNTER — Encounter
Admission: EM | Disposition: A | Discharge status: Nursing Home (Includes ICF) | Payer: Self-pay | Source: Home / Self Care | Attending: Internal Medicine

## 2016-10-28 ENCOUNTER — Encounter: Payer: Self-pay | Admitting: Anesthesiology

## 2016-10-28 HISTORY — PX: TRACHEOSTOMY TUBE PLACEMENT: SHX814

## 2016-10-28 LAB — MAGNESIUM: MAGNESIUM: 2.3 mg/dL (ref 1.7–2.4)

## 2016-10-28 LAB — BASIC METABOLIC PANEL
Anion gap: 7 (ref 5–15)
BUN: 34 mg/dL — AB (ref 6–20)
CO2: 35 mmol/L — ABNORMAL HIGH (ref 22–32)
CREATININE: 1.05 mg/dL (ref 0.61–1.24)
Calcium: 9 mg/dL (ref 8.9–10.3)
Chloride: 102 mmol/L (ref 101–111)
GFR calc Af Amer: >60 mL/min (ref >60)
Glucose, Bld: 112 mg/dL — ABNORMAL HIGH (ref 65–99)
Potassium: 3.9 mmol/L (ref 3.5–5.1)
SODIUM: 144 mmol/L (ref 135–145)

## 2016-10-28 LAB — CBC
HCT: 38.5 % — ABNORMAL LOW (ref 40.0–52.0)
Hemoglobin: 12.9 g/dL — ABNORMAL LOW (ref 13.0–18.0)
MCH: 27.4 pg (ref 26.0–34.0)
MCHC: 33.4 g/dL (ref 32.0–36.0)
MCV: 82 fL (ref 80.0–100.0)
PLATELETS: 265 10*3/uL (ref 150–440)
RBC: 4.7 MIL/uL (ref 4.40–5.90)
RDW: 15.3 % — ABNORMAL HIGH (ref 11.5–14.5)
WBC: 13.7 10*3/uL — ABNORMAL HIGH (ref 3.8–10.6)

## 2016-10-28 LAB — GLUCOSE, CAPILLARY
Glucose-Capillary: 102 mg/dL — ABNORMAL HIGH (ref 65–99)
Glucose-Capillary: 115 mg/dL — ABNORMAL HIGH (ref 65–99)
Glucose-Capillary: 124 mg/dL — ABNORMAL HIGH (ref 65–99)
Glucose-Capillary: 127 mg/dL — ABNORMAL HIGH (ref 65–99)
Glucose-Capillary: 130 mg/dL — ABNORMAL HIGH (ref 65–99)
Glucose-Capillary: 133 mg/dL — ABNORMAL HIGH (ref 65–99)
Glucose-Capillary: 92 mg/dL (ref 65–99)

## 2016-10-28 SURGERY — CREATION, TRACHEOSTOMY
Anesthesia: General

## 2016-10-28 MED ORDER — FENTANYL CITRATE (PF) 100 MCG/2ML IJ SOLN
INTRAMUSCULAR | Status: DC | PRN
  Administered 2016-10-28 (×2): 50 ug via INTRAVENOUS

## 2016-10-28 MED ORDER — PROPOFOL 500 MG/50ML IV EMUL
INTRAVENOUS | Status: AC
  Filled 2016-10-28: qty 50

## 2016-10-28 MED ORDER — ROCURONIUM BROMIDE 100 MG/10ML IV SOLN
INTRAVENOUS | Status: DC | PRN
  Administered 2016-10-28 (×2): 10 mg via INTRAVENOUS
  Administered 2016-10-28: 20 mg via INTRAVENOUS

## 2016-10-28 MED ORDER — PROPOFOL 500 MG/50ML IV EMUL
INTRAVENOUS | Status: DC | PRN
  Administered 2016-10-28: 10 ug/kg/min via INTRAVENOUS

## 2016-10-28 MED ORDER — DEXAMETHASONE SODIUM PHOSPHATE 10 MG/ML IJ SOLN
INTRAMUSCULAR | Status: AC
  Filled 2016-10-28: qty 1

## 2016-10-28 MED ORDER — SODIUM CHLORIDE 0.9 % IV SOLN
INTRAVENOUS | Status: DC | PRN
  Administered 2016-10-28: 09:00:00 via INTRAVENOUS

## 2016-10-28 MED ORDER — ONDANSETRON HCL 4 MG/2ML IJ SOLN
INTRAMUSCULAR | Status: AC
  Filled 2016-10-28: qty 2

## 2016-10-28 MED ORDER — LIDOCAINE-EPINEPHRINE 1 %-1:100000 IJ SOLN
INTRAMUSCULAR | Status: AC
Start: 2016-10-28 — End: 2016-10-28
  Filled 2016-10-28: qty 1

## 2016-10-28 MED ORDER — METHYLPREDNISOLONE SODIUM SUCC 125 MG IJ SOLR
40.0000 mg | INTRAMUSCULAR | Status: DC
  Administered 2016-10-29 – 2016-11-01 (×4): 40 mg via INTRAVENOUS
  Filled 2016-10-28 (×4): qty 2

## 2016-10-28 MED ORDER — GLYCOPYRROLATE 0.2 MG/ML IJ SOLN
INTRAMUSCULAR | Status: AC
  Filled 2016-10-28: qty 1

## 2016-10-28 MED ORDER — SODIUM CHLORIDE 0.9 % IV SOLN
INTRAVENOUS | Status: DC | PRN
  Administered 2016-10-28: 100 ug/h via INTRAVENOUS

## 2016-10-28 MED ORDER — FENTANYL CITRATE (PF) 100 MCG/2ML IJ SOLN
INTRAMUSCULAR | Status: AC
  Filled 2016-10-28: qty 2

## 2016-10-28 MED ORDER — DEXAMETHASONE SODIUM PHOSPHATE 10 MG/ML IJ SOLN
INTRAMUSCULAR | Status: DC | PRN
  Administered 2016-10-28: 10 mg via INTRAVENOUS

## 2016-10-28 MED ORDER — MIDAZOLAM HCL 2 MG/2ML IJ SOLN
INTRAMUSCULAR | Status: AC
  Filled 2016-10-28: qty 2

## 2016-10-28 MED ORDER — PHENYLEPHRINE HCL 10 MG/ML IJ SOLN
INTRAMUSCULAR | Status: DC | PRN
  Administered 2016-10-28 (×3): 80 ug via INTRAVENOUS

## 2016-10-28 SURGICAL SUPPLY — 31 items
BLADE SURG 15 STRL LF DISP TIS (BLADE) ×1 IMPLANT
BLADE SURG 15 STRL SS (BLADE) ×2
BLADE SURG SZ11 CARB STEEL (BLADE) ×3 IMPLANT
CANISTER SUCT 1200ML W/VALVE (MISCELLANEOUS) ×3 IMPLANT
CORD BIP STRL DISP 12FT (MISCELLANEOUS) ×3 IMPLANT
ELECT REM PT RETURN 9FT ADLT (ELECTROSURGICAL) ×3
ELECTRODE REM PT RTRN 9FT ADLT (ELECTROSURGICAL) ×1 IMPLANT
FORCEPS JEWEL BIP 4-3/4 STR (INSTRUMENTS) ×3 IMPLANT
GAUZE PACKING IODOFORM 1/2 (PACKING) IMPLANT
GLOVE BIO SURGEON STRL SZ7.5 (GLOVE) ×3 IMPLANT
GOWN STRL REUS W/ TWL LRG LVL3 (GOWN DISPOSABLE) ×2 IMPLANT
GOWN STRL REUS W/TWL LRG LVL3 (GOWN DISPOSABLE) ×4
HEMOSTAT SURGICEL 2X3 (HEMOSTASIS) ×3 IMPLANT
HLDR TRACH TUBE NECKBAND 18 (MISCELLANEOUS) ×1 IMPLANT
HOLDER TRACH TUBE NECKBAND 18 (MISCELLANEOUS) ×2
KIT RM TURNOVER STRD PROC AR (KITS) ×3 IMPLANT
LABEL OR SOLS (LABEL) ×3 IMPLANT
NS IRRIG 500ML POUR BTL (IV SOLUTION) ×3 IMPLANT
PACK HEAD/NECK (MISCELLANEOUS) ×3 IMPLANT
SHEARS HARMONIC 9CM CVD (BLADE) ×3 IMPLANT
SPONGE DRAIN TRACH 4X4 STRL 2S (GAUZE/BANDAGES/DRESSINGS) ×3 IMPLANT
SPONGE KITTNER 5P (MISCELLANEOUS) ×3 IMPLANT
SUCTION FRAZIER HANDLE 10FR (MISCELLANEOUS) ×2
SUCTION TUBE FRAZIER 10FR DISP (MISCELLANEOUS) ×1 IMPLANT
SUT SILK 2 0 (SUTURE)
SUT SILK 2 0 SH (SUTURE) IMPLANT
SUT SILK 2-0 18XBRD TIE 12 (SUTURE) IMPLANT
SUT VIC AB 3-0 PS2 18 (SUTURE) ×3 IMPLANT
SYR 3ML LL SCALE MARK (SYRINGE) ×3 IMPLANT
TUBE TRACH SHILEY  6 DIST  CUF (TUBING) IMPLANT
TUBE TRACH SHILEY 8 DIST CUF (TUBING) ×3 IMPLANT

## 2016-10-28 NOTE — Care Management (Signed)
Per Dr. Ardyth Manam- patient for trach today and PEG possibly tomorrow. [id number] Phone [phone] before 1500 today for LTAC.  Dr. Ardyth Manam agrees to do [facility].

## 2016-10-28 NOTE — Progress Notes (Signed)
[facility] [place] [facility]   PATIENT NAME: Kevin Holden    MR#:  [id number]  DATE OF BIRTH:  10/30/1950  SUBJECTIVE:  Remains intubated and on the vent Patient on fentanyl and propofol No successful weaning- he is alert .   Received trach today 10/28/16. REVIEW OF SYSTEMS:   Review of Systems  Unable to perform ROS: Intubated   Tolerating PT: on the vent  DRUG ALLERGIES:   Allergies  Allergen Reactions  . Aspirin Other (See Comments)    Unknown  . Celecoxib Other (See Comments)    Reaction: Unknown  . Nsaids Other (See Comments)    Reaction: Unknown    VITALS:  Blood pressure (!) 94/58, pulse (!) 113, temperature 98.8 F (37.1 C), temperature source Axillary, resp. rate 14, height 6' (1.829 m), weight 56.1 kg (123 lb 10.9 oz), SpO2 92 %.  PHYSICAL EXAMINATION:   Physical Exam  GENERAL:  66 y.o.-year-old patient lying in the bed with no acute distress. Critically ill EYES: Pupils equal, round, reactive to light and accommodation. No scleral icterus. Extraocular muscles intact.  HEENT: Head atraumatic, normocephalic. Oropharynx and nasopharynx clear. trach on the vent NECK:  Supple, no jugular venous distention. No thyroid enlargement, no tenderness.  LUNGS: Normal breath sounds bilaterally, no wheezing, rales, rhonchi. No use of accessory muscles of respiration.  CARDIOVASCULAR: S1, S2 normal. No murmurs, rubs, or gallops.  ABDOMEN: Soft, nontender, nondistended. Bowel sounds present. No organomegaly or mass.  EXTREMITIES: No cyanosis, clubbing or edema b/l.    NEUROLOGIC: vent , alert, eyes open, shakes hand with me. PSYCHIATRIC: on the vent SKIN: No obvious rash, lesion, or ulcer.   LABORATORY PANEL:  CBC  Recent Labs Lab 10/28/16 0441  WBC 13.7*  HGB 12.9*  HCT 38.5*  PLT 265    Chemistries   Recent Labs Lab 10/28/16 0441  NA 144  K 3.9  CL 102  CO2 35*  GLUCOSE 112*  BUN 34*  CREATININE 1.05  CALCIUM 9.0   MG 2.3   Cardiac Enzymes No results for input(s): TROPONINI in the last 168 hours. RADIOLOGY:  Dg Chest 1 View  Result Date: 10/28/2016 CLINICAL DATA:  Intubation. EXAM: CHEST 1 VIEW COMPARISON:  Of 32,018. FINDINGS: Endotracheal tube, NG tube, left IJ line stable position. Heart size normal. No focal infiltrate. Mild basilar subsegmental atelectasis. No pleural effusion or pneumothorax P IMPRESSION: 1. Lines and tubes stable position. 2. Mild basilar subsegmental atelectasis. Electronically Signed   By: Maisie Fushomas  Register   On: 10/28/2016 06:18   Dg Chest 1 View  Result Date: 10/27/2016 CLINICAL DATA:  respiratory failure. EXAM: CHEST 1 VIEW COMPARISON:  10/26/2016 and 10/24/2016 FINDINGS: Endotracheal tube and central venous catheter appear in good position. Tip of the NG tube is in the upper fundus of the stomach with the proximal side hole in the distal esophagus, unchanged. The heart size and pulmonary vascularity are normal and the lungs are clear. No effusions. No bone abnormality. IMPRESSION: No acute abnormalities.  No change since the prior study. Electronically Signed   By: Francene BoyersJames  Maxwell M.D.   On: 10/27/2016 10:02   ASSESSMENT AND PLAN:   [name] is a 66 y.o. black male Asthma/COPD, hypertension, prior history of pneumonia, who [facility]for evaluation of increasing shortness of breath. COPD exacerbation   # Acute on chronic respiratory failure with hypoxia due to COPD exacerbation and pneumonia Still needing full ventilatory support - Sedated Nebs +  IV steroids On fentanyl and precdex Unable to wean so far. Janina Mayo is done 10/28/16. LTACH referral made  # Septic shock secondary to pneumonia Off pressors. Resolved   # COPD exacerbation due to acute bronchitis, continue  patient on levofloxacin, Pulmicort, DuoNeb's, prednisone, Spiriva, follow clinically   # Acute kidney injury due to ATN from hypotension Improving slowly  # chronic  Tobacco abuse.   Case discussed with Care Management/Social Worker. Management plans discussed with the patient, family and they are in agreement.  CODE STATUS: FULL  DVT Prophylaxis: Lovenox  TOTAL TIME TAKING CARE OF THIS PATIENT: 32 minutes.  >50% time spent on counselling and coordination of care  POSSIBLE D/C IN  Few DAYS, DEPENDING ON CLINICAL CONDITION.  Note: This dictation was prepared with Dragon dictation along with smaller phrase technology. Any transcriptional errors that result from this process are unintentional.  Altamese Dilling M.D on 10/28/2016 at 4:10 PM  Between 7am to 6pm - Pager - [phone]  After 6pm go to [email or web address] - Social research officer, government  [facility]  Office  [phone]  CC: Primary care physician; Leanna Sato, MD

## 2016-10-28 NOTE — Progress Notes (Signed)
Patient to OR. Report given to PlattevilleBrandi, Charity fundraiserN. Fentanyl and propofol infusing. Daughters at bedside. Consent signed and placed in chart. CHG bath completed as reported by previous shift.

## 2016-10-28 NOTE — Anesthesia Procedure Notes (Signed)
Date/Time: 10/28/2016 9:49 AM Performed by: Mauri BrooklynPARAS, Innocence Schlotzhauer Tube size: 8.0 mm Comments: ETT in situ

## 2016-10-28 NOTE — Care Management (Signed)
Peer to Peer completed. This [employer] policy requires 21-day respiratory wean trial. I have updated [facility] and patient's daughter [name].

## 2016-10-28 NOTE — Anesthesia Post-op Follow-up Note (Cosign Needed)
Anesthesia QCDR form completed.        

## 2016-10-28 NOTE — Progress Notes (Signed)
Patient s/p tracheostomy this shift. Alert and able to follow commands. Fentanyl and Propofol continue. Tolerating vent with no weaning trials. B/P remain low with MAP above 65. Urine output adequate this shift. Consent for PEG signed by daughter and placed in chart. Plan for PEG 10/29/16. Patient NPO for procedure.

## 2016-10-28 NOTE — Progress Notes (Addendum)
PULMONARY / CRITICAL CARE MEDICINE   Name: Kevin Holden MRN: [id number] DOB: 01-19-1951    ADMISSION DATE:  10/15/2016    PT PROFILE:   66 y.o. male smoker intubated after failing BiPAP with diagnosis of AECOPD  MAJOR EVENTS/TEST RESULTS: 05/18 Admitted by Hospitalist service with acute on chronic respiratory failure deemed due to AECOPD 05/18 Echo:The estimated ejection   fraction was in the range of 55% to 60%. Wall motion was normal. Doppler parameters are con sistent with abnormal left ventricular relaxation (grade 1 diastolic dysfunction).  05/19 Transferred to ICU/SDU for BiPAP. Failed BiPAP and intubated 05/20 Renal US: No cause for acute renal failure identified. Both kidneys are relatively small, measuring less than 9 cm  05/21 very agitated on WUA. No weaning 05/22 Very agitated on WUA. Dexmedetomidine initiated 05/23 increased airflow obstruction. Precedex changed to propofol. Continued problems with agitation on WUA 05/24 persistent severe airflow obstruction. PSV as tolerated. 05/25 family meeting held with pts 3 daughters to discuss goals of care pts daughters stated they would like to proceed with tracheostomy and peg tube placement if indicated. They also wanted to change pts code status to limited code to include NO CPR, ACLS medications, or defibrillation and/or cardioversion 5/28 Met with family, discussed status and plan, continue to want trach.  5/29 Eldest daughter continues to want trach, confirms DNR status. ; failed weaning trial.  5/30; Failed weaning trial due to tachypnea and tachycardia within 15 minutes.   INDWELLING DEVICES:: ETT 05/19 >>  L IJ CVL 05/19 >>   MICRO DATA: MRSA PCR 05/19 >> NEG Urine 05/20 >> NEG  ANTIMICROBIALS:  Levofloxacin 05/19 >> 05/22 Ceftriaxone 05/22 >>05/25  REVIEW OF SYSTEMS: Unable to assess pt mechanically intubated  SUBJ: Pt sedated mechanically intubated  VITAL SIGNS: BP 106/75 (BP Location: Left Arm)   Pulse  (!) 105   Temp 98.7 F (37.1 C) (Axillary)   Resp (!) 21   Ht 6' (1.829 m)   Wt 123 lb 10.9 oz (56.1 kg)   SpO2 93%   BMI 16.77 kg/m   HEMODYNAMICS:    VENTILATOR SETTINGS: Vent Mode: PRVC FiO2 (%):  [28 %] 28 % Set Rate:  [14 bmp] 14 bmp Vt Set:  [600 mL] 600 mL PEEP:  [5 cmH20] 5 cmH20  INTAKE / OUTPUT: I/O last 3 completed shifts: In: 2113.2 [I.V.:753.2; NG/GT:1360] Out: 3500 [Urine:3500]  PHYSICAL EXAMINATION: Gen: acutely ill frail appearing [name], mechanically intubated  Neuro: sedated not following commands, withdraws from painful stimulation, PERRL HEENT: supple, no JVD present  Lungs: rhonchi and diminished throughout with very prolonged exp phase, non labored Cardiovascular: sinus tach, s1s2, no M/R/G Abdomen: soft, +BS x4, soft, non tender, non distended  Musculoskeletal: normal tone, 1+ bilateral lower extremity edema Skin: intact no rashes or lesions  LABS:  BMET  Recent Labs Lab 10/23/16 0436 10/26/16 0417 10/28/16 0441  NA 143 143 144  K 4.7 4.5 3.9  CL 95* 95* 102  CO2 43* 41* 35*  BUN 21* 27* 34*  CREATININE 0.74 0.77 1.05  GLUCOSE 145* 127* 112*    Electrolytes  Recent Labs Lab 10/23/16 0436 10/26/16 0417 10/28/16 0441  CALCIUM 9.3 9.0 9.0  MG  --   --  2.3    CBC  Recent Labs Lab 10/23/16 0436 10/26/16 0417 10/28/16 0441  WBC 12.0* 12.8* 13.7*  HGB 12.5* 12.2* 12.9*  HCT 38.2* 36.8* 38.5*  PLT 226 235 265    ABG  Recent Labs Lab 10/26/16 0430  PHART 7.48*  PCO2ART 65*  PO2ART 57*    ASSESSMENT / PLAN:  PULMONARY A: Acute on chronic hypercarbic respiratory failure AECOPD with advanced COPD/emphysema.  Vent dependent, failed multiple weaning attempts. Intubated 5/19 P:   Cont vent support - settings reviewed and/or adjusted Cont vent bundle Daily SBT if/when meets criteria Cont nebulized steroids and bronchodilators IV steroids weaned further.  Tracheostomy today.    Hypertension,  controlled -We'll continue to monitor.   GASTROINTESTINAL SUP: enteral famotidine Cont TF protocol Gi consulted, PEG tube on 6/1; d/w gastroenterology.    ENDOCRINE A:   DM 2 Steroid induced hyperglycemia Cont SSI protocol  Addendum: Called for P2P discussion for approval for LTAC placement, left voicemail with my call back info.    [name] M.D. 10/28/2016   Critical Care Attestation.  I have personally obtained a history, examined the patient, evaluated laboratory and imaging results, formulated the assessment and plan and placed orders. The Patient requires high complexity decision making for assessment and support, frequent evaluation and titration of therapies, application of advanced monitoring technologies and extensive interpretation of multiple databases. The patient has critical illness that could lead imminently to failure of 1 or more organ systems and requires the highest level of physician preparedness to intervene.  Critical Care Time devoted to patient care services described in this note is 35 minutes and is exclusive of time spent in procedures.

## 2016-10-28 NOTE — Transfer of Care (Signed)
Immediate Anesthesia Transfer of Care Note  Patient: Kevin Holden  Procedure(s) Performed: Procedure(s): TRACHEOSTOMY (N/A)  Patient Location: ICU  Anesthesia Type:General  Level of Consciousness: sedated  Airway & Oxygen Therapy: Patient placed on Ventilator (see vital sign flow sheet for setting)  Post-op Assessment: Report given to RN and Post -op Vital signs reviewed and stable  Post vital signs: Reviewed and stable  Last Vitals:  Vitals:   10/28/16 0920 10/28/16 1033  BP: 101/74 106/75  Pulse: (!) 122 (!) 105  Resp: (!) 21   Temp:      Last Pain:  Vitals:   10/28/16 0900  TempSrc: Axillary  PainSc:       Patients Stated Pain Goal: 4 (10/15/16 2045)  Complications: No apparent anesthesia complications

## 2016-10-28 NOTE — Op Note (Signed)
..  10/28/2016 10:13 AM  [name],  [place] [id number]  Pre-Op Dx: COPD, Respiratory failure  Post-Op Dx:  same  Proc:  Tracheostomy  Surg:  Angelyne Terwilliger  Anes:  GOT  EBL:  <645ml  Comp:  none  Findings:  Size 8 Shiley cuffed placed without difficulty between tracheal ring 2 and 3  Procedure:  The patient was brought from the intensive care unit to the operating room and transferred to an operating table.  Anesthesia was administered per indwelling orotracheal tube.   Neck extension was achieved as possible anda shoulder rolke was placed.  The lower neck was palpated with the findings as described above.  1% Xylocaine with 1:100,000 epinephrine, 8 cc's, was infiltrated into the surgical field for intraoperative hemostasis.  Several minutes were allowed for this to take effect. The patient was prepped in a sterile fashion with a surgical prep from the chin down to the upper chest.  Sterile draping was accomplished in the standard fashion.  A  4 cm horizontal incision was made sharply a finger's breadth above the sternal notch, and extended through skin and subcutaneous fat.  Using cautery, the superficial layer of the deep cervical fascia was lysed.  Additional dissection revealed the strap muscles.  The midline raphe was divided in two layers and the muscles retracted laterally.  The pretracheal plane was visualized.  This was entered bluntly.  The thyroid isthmus was isolated and divided with the Harmonic scalpel.  The thyroid gland was retracted to either side.  The anterior face of the trachea was cleared.  In the  2nd and 3rd interspace, a transverse incision was made between cartilage rings into the tracheal lumen.  A 6 mm wide inferiorly based flap was generated.   A previously tested  # 8 Shiley cuffed tracheostomy tube was brought into the field.  With the endotracheal tube under direct visualization through the tracheostomy, it was gently backed up.  The tracheostomy tube was inserted  into the tracheal lumen.  Hemostasis was observed. The cuff was inflated and observed to be intact and containing pressure. The inner cannula was placed and ventilation assumed per tracheostomy tube.  Good chest wall motion was observed, and CO2 was documented per anesthesia.  The trach tube was secured in the standard fashion with trach ties. A 2-0 Nylon suture was used to secure the trach tube to the skin on both sides.  Hemostasis was observed again.  When satisfactory ventilation was assured, the orotracheal tube was removed.  At this point the procedure was completed.  The patient was returned to anesthesia, awakened as possible, and transferred back to the intensive care unit in stable condition.  Comment: 66 y.o. male with prolonged ventilation was the indication for today's procedure.  Anticipate a routine postoperative recovery including standard tracheal hygiene.  The sutures should be removed in 5 days.  When the patient no longer requires ventilator or pressure support, the cuff should be deflated.  Changing to an uncuffed tube and downsizing will be according to the clinical condition of the patient.   Lekeshia Kram  10:13 AM 10/28/2016

## 2016-10-29 ENCOUNTER — Encounter: Payer: Self-pay | Admitting: Anesthesiology

## 2016-10-29 ENCOUNTER — Encounter
Admission: EM | Disposition: A | Discharge status: Nursing Home (Includes ICF) | Payer: Self-pay | Source: Home / Self Care | Attending: Internal Medicine

## 2016-10-29 DIAGNOSIS — R633 Feeding difficulties: Secondary | ICD-10-CM

## 2016-10-29 DIAGNOSIS — R6339 Other feeding difficulties: Secondary | ICD-10-CM

## 2016-10-29 HISTORY — PX: PEG PLACEMENT: SHX5437

## 2016-10-29 LAB — CBC
HCT: 34.5 % — ABNORMAL LOW (ref 40.0–52.0)
Hemoglobin: 11.3 g/dL — ABNORMAL LOW (ref 13.0–18.0)
MCH: 27.5 pg (ref 26.0–34.0)
MCHC: 32.7 g/dL (ref 32.0–36.0)
MCV: 84 fL (ref 80.0–100.0)
Platelets: 230 10*3/uL (ref 150–440)
RBC: 4.11 MIL/uL — AB (ref 4.40–5.90)
RDW: 15.1 % — ABNORMAL HIGH (ref 11.5–14.5)
WBC: 15.8 10*3/uL — AB (ref 3.8–10.6)

## 2016-10-29 LAB — GLUCOSE, CAPILLARY
Glucose-Capillary: 103 mg/dL — ABNORMAL HIGH (ref 65–99)
Glucose-Capillary: 117 mg/dL — ABNORMAL HIGH (ref 65–99)
Glucose-Capillary: 126 mg/dL — ABNORMAL HIGH (ref 65–99)
Glucose-Capillary: 128 mg/dL — ABNORMAL HIGH (ref 65–99)
Glucose-Capillary: 136 mg/dL — ABNORMAL HIGH (ref 65–99)
Glucose-Capillary: 143 mg/dL — ABNORMAL HIGH (ref 65–99)
Glucose-Capillary: 94 mg/dL (ref 65–99)

## 2016-10-29 LAB — BASIC METABOLIC PANEL
ANION GAP: 8 (ref 5–15)
BUN: 33 mg/dL — ABNORMAL HIGH (ref 6–20)
CALCIUM: 8.3 mg/dL — AB (ref 8.9–10.3)
CO2: 32 mmol/L (ref 22–32)
Chloride: 107 mmol/L (ref 101–111)
Creatinine, Ser: 1.04 mg/dL (ref 0.61–1.24)
GLUCOSE: 117 mg/dL — AB (ref 65–99)
POTASSIUM: 3.8 mmol/L (ref 3.5–5.1)
Sodium: 147 mmol/L — ABNORMAL HIGH (ref 135–145)

## 2016-10-29 LAB — TRIGLYCERIDES: TRIGLYCERIDES: 147 mg/dL (ref <150)

## 2016-10-29 LAB — MAGNESIUM: MAGNESIUM: 2.2 mg/dL (ref 1.7–2.4)

## 2016-10-29 SURGERY — INSERTION, PEG TUBE
Anesthesia: General

## 2016-10-29 MED ORDER — CEPHALEXIN 250 MG/5ML PO SUSR
1000.0000 mg | Freq: Once | ORAL | Status: AC
  Administered 2016-10-30: 1000 mg
  Filled 2016-10-29: qty 20

## 2016-10-29 MED ORDER — MIDAZOLAM HCL 2 MG/2ML IJ SOLN: 2.0000 mg | INTRAMUSCULAR | Status: AC

## 2016-10-29 MED ORDER — FENTANYL CITRATE (PF) 100 MCG/2ML IJ SOLN: 100.0000 ug | INTRAMUSCULAR | Status: AC

## 2016-10-29 MED ORDER — MIDAZOLAM HCL 2 MG/2ML IJ SOLN
INTRAMUSCULAR | Status: DC | PRN
Start: 2016-10-29 — End: 2016-10-29
  Administered 2016-10-29: 2 mg via INTRAVENOUS

## 2016-10-29 MED ORDER — FENTANYL CITRATE (PF) 100 MCG/2ML IJ SOLN
INTRAMUSCULAR | Status: DC | PRN
Start: 2016-10-29 — End: 2016-10-29
  Administered 2016-10-29: 100 ug via INTRAVENOUS

## 2016-10-29 MED ORDER — FENTANYL CITRATE (PF) 100 MCG/2ML IJ SOLN
50.0000 ug | INTRAMUSCULAR | Status: DC | PRN
  Administered 2016-10-30 – 2016-10-31 (×2): 100 ug via INTRAVENOUS
  Administered 2016-10-31: 50 ug via INTRAVENOUS
  Filled 2016-10-29 (×4): qty 2

## 2016-10-29 NOTE — Progress Notes (Signed)
Dr. Elenore RotaJuengel paged. MD came and removed soiled trach dressing. MD order to change soiled gauze.

## 2016-10-29 NOTE — Progress Notes (Signed)
Nutrition Follow-up  DOCUMENTATION CODES:   Severe malnutrition in context of chronic illness  INTERVENTION:  Once able to use PEG tube (likely 24 hours after placement and confirmation), recommend initiating Vital 1.5 @ 45 ml/hr. With current propofol rate provides 1744 kcal, 73 grams of protein, 821 ml H2O.  With new goal TF regimen liquid MVI will no longer be needed to meet 100% RDIs.  Recommend resuming free water flushes of 200 ml Q8hrs. Will provide total of 1421 ml H2O from tube feeding and flushes, and does not include water provided as flushes with medication.  NUTRITION DIAGNOSIS:   Malnutrition related to catabolic illness as evidenced by severe depletion of muscle mass, severe depletion of body fat.  Ongoing.  GOAL:   Patient will meet greater than or equal to 90% of their needs  Not met with TF on hold - will resume tomorrow.  MONITOR:   PO intake, Supplement acceptance, Labs, Weight trends  REASON FOR ASSESSMENT:   Other (Comment) (low BMI)    ASSESSMENT:   66 year old male with h/o HTN and COPD admitted for acute on chronic respiratory failure with hypoxemia.  -Patient intubated on 5/19 after failing BiPAP. -Continued to fail weaning trial. -S/p tracheostomy on 5/31. -TFs were held early morning of 5/31 and OGT was removed. Patient was previously receiving Vital 1.2 @ 45 ml/hr per OGT and free water flushes of 200 ml Q8hrs. Plan is for placement of PEG tube today.  Discussed with RN. Plan is to restart tube feedings when safe to use tube 24 hours after PEG tube placement. Re-estimated needs with current Ve and Tmax.  Patient is currently intubated on ventilator support MV: 10.7 L/min Temp (24hrs), Avg:98.8 F (37.1 C), Min:97.7 F (36.5 C), Max:100.4 F (38 C)  Propofol: 4.7 ml/hr (124 kcal/day)  Medications reviewed and include: Colace, famotidine, Novolog 0-14 units Q4hrs, methylprednisolone 40 mg Q24hrs, liquid MVI per tube daily, Miralax,  Precedex gtt, propofol gtt.  Labs reviewed: CBG 92-136 past 24 hrs, Sodium 147, BUN 33, Triglycerides 147.  I/O: 2.325 L UOP yesterday (1.9 ml/kg/hr - adequate), one bowel movement yesterday  Weight trend: 51.7 kg on 6/1 (-4.4 kg from yesterday) weight is fluctuating significantly during admission  Diet Order:     Skin:  Reviewed, no issues  Last BM:  10/29/2016 - type 6  Height:   Ht Readings from Last 1 Encounters:  10/16/16 6' (1.829 m)    Weight:   Wt Readings from Last 1 Encounters:  10/29/16 113 lb 15.7 oz (51.7 kg)    Ideal Body Weight:  80.9 kg  BMI:  Body mass index is 15.46 kg/m.  Estimated Nutritional Needs:   Kcal:  0768 (PSU)  Protein:  72-88 grams (1.4-1.7 grams/kg)  Fluid:  1.5-1.8 L/day (30-35 ml/kg)  EDUCATION NEEDS:   Education needs no appropriate at this time  Willey Blade, MS, RD, LDN Pager: [phone] After Hours Pager: [phone]

## 2016-10-29 NOTE — OR Nursing (Signed)
Endo equipment set up for PEG placement CCU-15. Propofol increased per NP Sonda Rumbleana Blakeney order. Fentanyl and Versed given to supplement comfort.

## 2016-10-29 NOTE — Progress Notes (Signed)
PULMONARY / CRITICAL CARE MEDICINE   Name: Kevin Holden MRN: [id number] DOB: 29-Nov-1950    ADMISSION DATE:  10/15/2016    PT PROFILE:   66 y.o. male smoker intubated after failing BiPAP with diagnosis of AECOPD  MAJOR EVENTS/TEST RESULTS: 05/18 Admitted by Hospitalist service with acute on chronic respiratory failure deemed due to AECOPD 05/18 Echo:The estimated ejection   fraction was in the range of 55% to 60%. Wall motion was normal. Doppler parameters are con sistent with abnormal left ventricular relaxation (grade 1 diastolic dysfunction).  05/19 Transferred to ICU/SDU for BiPAP. Failed BiPAP and intubated 05/20 Renal US: No cause for acute renal failure identified. Both kidneys are relatively small, measuring less than 9 cm  05/21 very agitated on WUA. No weaning 05/22 Very agitated on WUA. Dexmedetomidine initiated 05/23 increased airflow obstruction. Precedex changed to propofol. Continued problems with agitation on WUA 05/24 persistent severe airflow obstruction. PSV as tolerated. 05/25 family meeting held with pts 3 daughters to discuss goals of care pts daughters stated they would like to proceed with tracheostomy and peg tube placement if indicated. They also wanted to change pts code status to limited code to include NO CPR, ACLS medications, or defibrillation and/or cardioversion 5/28 Met with family, discussed status and plan, continue to want trach.  5/29 Eldest daughter continues to want trach, confirms DNR status. ; failed weaning trial.  5/30; Failed weaning trial due to tachypnea and tachycardia within 15 minutes. Tracheostomy performed by ENT.  5/31: Peg tube placed by gastroenterology. Peer to peer with [facility] insurance for LTAC; pt will require 21 days on vent before transfer.   INDWELLING DEVICES:: ETT 05/19 >> Tracheostomy 5/30 L IJ CVL 05/19 >>  PEG 5/31  MICRO DATA: MRSA PCR 05/19 >> NEG Urine 05/20 >> NEG  ANTIMICROBIALS:  Levofloxacin 05/19 >>  05/22 Ceftriaxone 05/22 >>05/25  REVIEW OF SYSTEMS: Unable to assess pt mechanically intubated  SUBJ: Pt sedated mechanically intubated  VITAL SIGNS: BP (!) 82/59 (BP Location: Left Arm)   Pulse 95   Temp 97.7 F (36.5 C) (Axillary)   Resp 14   Ht 6' (1.829 m)   Wt 113 lb 15.7 oz (51.7 kg)   SpO2 90%   BMI 15.46 kg/m   HEMODYNAMICS:    VENTILATOR SETTINGS: Vent Mode: PRVC FiO2 (%):  [28 %-35 %] 35 % Set Rate:  [14 bmp] 14 bmp Vt Set:  [600 mL] 600 mL PEEP:  [5 cmH20] 5 cmH20  INTAKE / OUTPUT: I/O last 3 completed shifts: In: 972.8 [I.V.:852.8; NG/GT:120] Out: 2130 [Urine:3975; Blood:5]  PHYSICAL EXAMINATION: Gen: acutely ill frail appearing [name], mechanically intubated  Neuro: sedated not following commands, withdraws from painful stimulation, PERRL HEENT: supple, no JVD present  Lungs: rhonchi and diminished throughout with very prolonged exp phase, non labored Cardiovascular: sinus tach, s1s2, no M/R/G Abdomen: soft, +BS x4, soft, non tender, non distended  Musculoskeletal: normal tone, 1+ bilateral lower extremity edema Skin: intact no rashes or lesions  LABS:  BMET  Recent Labs Lab 10/23/16 0436 10/26/16 0417 10/28/16 0441  NA 143 143 144  K 4.7 4.5 3.9  CL 95* 95* 102  CO2 43* 41* 35*  BUN 21* 27* 34*  CREATININE 0.74 0.77 1.05  GLUCOSE 145* 127* 112*    Electrolytes  Recent Labs Lab 10/23/16 0436 10/26/16 0417 10/28/16 0441  CALCIUM 9.3 9.0 9.0  MG  --   --  2.3    CBC  Recent Labs Lab 10/26/16 0417 10/28/16 0441  10/29/16 0550  WBC 12.8* 13.7* 15.8*  HGB 12.2* 12.9* 11.3*  HCT 36.8* 38.5* 34.5*  PLT 235 265 230    ABG  Recent Labs Lab 10/26/16 0430  PHART 7.48*  PCO2ART 65*  PO2ART 42*    ASSESSMENT / PLAN:  PULMONARY A: Acute on chronic hypercarbic respiratory failure AECOPD with advanced COPD/emphysema.  Vent dependent, failed multiple weaning attempts. Intubated 5/19; trach 5/31 P:   Cont vent  support - decreased Vt to 550 cc.  Cont vent bundle Daily SBT if/when meets criteria Cont nebulized steroids and bronchodilators Change steroids to PO.     Hypertension, controlled -We'll continue to monitor.   GASTROINTESTINAL SUP: enteral famotidine Cont TF protocol Gi consulted, PEG tube on 6/1. Start tube feeds.    ENDOCRINE A:   DM 2 Steroid induced hyperglycemia Cont SSI protocol     -Deep Karson Reede M.D. 10/29/2016   Critical Care Attestation.  I have personally obtained a history, examined the patient, evaluated laboratory and imaging results, formulated the assessment and plan and placed orders. The Patient requires high complexity decision making for assessment and support, frequent evaluation and titration of therapies, application of advanced monitoring technologies and extensive interpretation of multiple databases. The patient has critical illness that could lead imminently to failure of 1 or more organ systems and requires the highest level of physician preparedness to intervene.  Critical Care Time devoted to patient care services described in this note is 30 minutes and is exclusive of time spent in procedures.

## 2016-10-29 NOTE — Progress Notes (Signed)
[facility] [place] at [facility]   PATIENT NAME: Kevin BurrowJoseph Lomanto    MR#:  [id number]  DATE OF BIRTH:  1951/02/28  SUBJECTIVE:  Remains intubated and on the vent Patient on fentanyl and propofol No successful weaning- he is alert .   Received trach 10/28/16 and PEG 10/29/16 REVIEW OF SYSTEMS:   Review of Systems  Unable to perform ROS: Intubated   Tolerating PT: on the vent  DRUG ALLERGIES:   Allergies  Allergen Reactions  . Aspirin Other (See Comments)    Unknown  . Celecoxib Other (See Comments)    Reaction: Unknown  . Nsaids Other (See Comments)    Reaction: Unknown    VITALS:  Blood pressure 97/72, pulse 96, temperature 98.1 F (36.7 C), temperature source Oral, resp. rate 14, height 6' (1.829 m), weight 51.7 kg (113 lb 15.7 oz), SpO2 93 %.  PHYSICAL EXAMINATION:   Physical Exam  GENERAL:  66 y.o.-year-old patient lying in the bed with Critically ill EYES: Pupils equal, round, reactive to light and accommodation. No scleral icterus. Extraocular muscles intact.  HEENT: Head atraumatic, normocephalic. Oropharynx and nasopharynx clear. Trach, on the vent NECK:  Supple, no jugular venous distention. No thyroid enlargement, no tenderness.  LUNGS: Normal breath sounds bilaterally, no wheezing, rales, rhonchi. No use of accessory muscles of respiration.  CARDIOVASCULAR: S1, S2 normal. No murmurs, rubs, or gallops.  ABDOMEN: Soft, nontender, nondistended. Bowel sounds present. No organomegaly or mass. PEG tube in place. EXTREMITIES: No cyanosis, clubbing or edema b/l.    NEUROLOGIC: vent , alert, eyes open, shakes hand with me. PSYCHIATRIC: on the vent SKIN: No obvious rash, lesion, or ulcer.   LABORATORY PANEL:  CBC  Recent Labs Lab 10/29/16 0550  WBC 15.8*  HGB 11.3*  HCT 34.5*  PLT 230    Chemistries   Recent Labs Lab 10/29/16 0550  NA 147*  K 3.8  CL 107  CO2 32  GLUCOSE 117*  BUN 33*  CREATININE 1.04  CALCIUM 8.3*  MG  2.2   Cardiac Enzymes No results for input(s): TROPONINI in the last 168 hours. RADIOLOGY:  Dg Chest 1 View  Result Date: 10/28/2016 CLINICAL DATA:  Intubation. EXAM: CHEST 1 VIEW COMPARISON:  Of 32,018. FINDINGS: Endotracheal tube, NG tube, left IJ line stable position. Heart size normal. No focal infiltrate. Mild basilar subsegmental atelectasis. No pleural effusion or pneumothorax P IMPRESSION: 1. Lines and tubes stable position. 2. Mild basilar subsegmental atelectasis. Electronically Signed   By: Maisie Fushomas  Register   On: 10/28/2016 06:18   ASSESSMENT AND PLAN:   Mr. Kevin BurrowJoseph Barritt is a 66 y.o. black male Asthma/COPD, hypertension, prior history of pneumonia, who was [facility]for evaluation of increasing shortness of breath. COPD exacerbation   # Acute on chronic respiratory failure with hypoxia due to COPD exacerbation and pneumonia Still needing full ventilatory support - Nebs + IV steroids On fentanyl and precdex Unable to wean so far. Janina Mayorach is done 10/28/16. LTACH referral made- may go after 21 on vent in hospital, as per ICU physician.  # Septic shock secondary to pneumonia Off pressors. Resolved   # COPD exacerbation due to acute bronchitis, continue  patient on levofloxacin, Pulmicort, DuoNeb's, prednisone, Spiriva, follow clinically   # Acute kidney injury due to ATN from hypotension Improving slowly  # chronic Tobacco abuse.   Case discussed with Care Management/Social Worker. Management plans discussed with the patient, family and they are in agreement.  CODE STATUS: FULL  DVT Prophylaxis: Lovenox  TOTAL TIME TAKING CARE OF THIS PATIENT: 32 minutes.  >50% time spent on counselling and coordination of care  POSSIBLE D/C IN  Few DAYS, DEPENDING ON CLINICAL CONDITION.  Note: This dictation was prepared with Dragon dictation along with smaller phrase technology. Any transcriptional errors that result from this process are  unintentional.  Altamese Dilling M.D on 10/29/2016 at 3:23 PM  Between 7am to 6pm - Pager - [phone]  After 6pm go to [email or web address] - Social research officer, government  [facility] [place] [facility]  Office  [phone]  CC: Primary care physician; Leanna Sato, MD

## 2016-10-29 NOTE — Op Note (Signed)
[facility] Gastroenterology Patient Name: Kevin BurrowJoseph Holden Procedure Date: 10/29/2016 3:53 PM MRN: [id number] Account #: [id number] Date of Birth: May 05, 1951 Admit Type: Inpatient Age: [age] [facility] ENDO ROOM 1 Gender: Male Note Status: Finalized Procedure:            Upper GI endoscopy Indications:          Place PEG because patient is unable to eat Providers:            [name] Yenesis Even MD, MD Medicines:            Propofol per Anesthesia Complications:        No immediate complications. Procedure:            Pre-Anesthesia Assessment:                       - Prior to the procedure, a History and Physical was                        performed, and patient medications and allergies were                        reviewed. The patient's tolerance of previous                        anesthesia was also reviewed. The risks and benefits of                        the procedure and the sedation options and risks were                        discussed with the patient. All questions were                        answered, and informed consent was obtained. Prior                        Anticoagulants: The patient has taken no previous                        anticoagulant or antiplatelet agents. ASA Grade                        Assessment: IV - A patient with severe systemic disease                        that is a constant threat to life. After reviewing the                        risks and benefits, the patient was deemed in                        satisfactory condition to undergo the procedure.                       After obtaining informed consent, the endoscope was                        passed under direct vision. Throughout the procedure,  the patient's blood pressure, pulse, and oxygen                        saturations were monitored continuously. The Endoscope                        was introduced through the mouth, and advanced to the     second part of duodenum. The upper GI endoscopy was                        accomplished without difficulty. The patient tolerated                        the procedure well. Findings:      The examined esophagus was normal.      The entire examined stomach was normal. Placement of an externally       removable PEG with no T-fasteners was successfully completed. The       external bumper was at the 2.0 cm marking on the tube.      The examined duodenum was normal. Impression:           - Normal esophagus.                       - Normal stomach.                       - Normal examined duodenum.                       - An externally removable PEG placement was                        successfully completed.                       - No specimens collected. Recommendation:       - Observe patient in ICU for ongoing care. Procedure Code(s):    --- Professional ---                       [phone], Esophagogastroduodenoscopy, flexible, transoral;                        with directed placement of percutaneous gastrostomy tube Diagnosis Code(s):    --- Professional ---                       R63.3, Feeding difficulties                       Z43.1, Encounter for attention to gastrostomy CPT copyright 2016 American Medical Association. All rights reserved. The codes documented in this report are preliminary and upon coder review may  be revised to meet current compliance requirements. Midge Minium MD, MD 10/29/2016 4:55:30 PM This report has been signed electronically. Number of Addenda: 0 Note Initiated On: 10/29/2016 3:53 PM      [facility]

## 2016-10-29 NOTE — Anesthesia Postprocedure Evaluation (Signed)
Anesthesia Post Note  Patient: Kevin Holden  Procedure(s) Performed: Procedure(s) (LRB): TRACHEOSTOMY (N/A)  Patient location during evaluation: ICU Anesthesia Type: General Level of consciousness: sedated Pain management: pain level controlled Vital Signs Assessment: vitals unstable Respiratory status: patient on ventilator - see flowsheet for VS Cardiovascular status: stable Anesthetic complications: no     Last Vitals:  Vitals:   10/29/16 0500 10/29/16 0600  BP: (!) 81/66 91/60  Pulse: 96 92  Resp: 14 14  Temp:      Last Pain:  Vitals:   10/29/16 0400  TempSrc: Axillary  PainSc:                  Starling Mannsurtis,  Najir Roop A

## 2016-10-30 LAB — GLUCOSE, CAPILLARY
Glucose-Capillary: 113 mg/dL — ABNORMAL HIGH (ref 65–99)
Glucose-Capillary: 133 mg/dL — ABNORMAL HIGH (ref 65–99)
Glucose-Capillary: 156 mg/dL — ABNORMAL HIGH (ref 65–99)
Glucose-Capillary: 113 mg/dL — ABNORMAL HIGH (ref 65–99)
Glucose-Capillary: 133 mg/dL — ABNORMAL HIGH (ref 65–99)

## 2016-10-30 LAB — CBC
HCT: 33.6 % — ABNORMAL LOW (ref 40.0–52.0)
Hemoglobin: 11.1 g/dL — ABNORMAL LOW (ref 13.0–18.0)
MCH: 27.5 pg (ref 26.0–34.0)
MCHC: 33.1 g/dL (ref 32.0–36.0)
MCV: 83 fL (ref 80.0–100.0)
Platelets: 239 10*3/uL (ref 150–440)
RBC: 4.04 MIL/uL — AB (ref 4.40–5.90)
RDW: 15.2 % — ABNORMAL HIGH (ref 11.5–14.5)
WBC: 17.2 10*3/uL — ABNORMAL HIGH (ref 3.8–10.6)

## 2016-10-30 LAB — BASIC METABOLIC PANEL
Anion gap: 8 (ref 5–15)
BUN: 31 mg/dL — AB (ref 6–20)
CO2: 31 mmol/L (ref 22–32)
CREATININE: 0.93 mg/dL (ref 0.61–1.24)
Calcium: 8.4 mg/dL — ABNORMAL LOW (ref 8.9–10.3)
Chloride: 113 mmol/L — ABNORMAL HIGH (ref 101–111)
GFR calc Af Amer: >60 mL/min (ref >60)
GFR calc non Af Amer: >60 mL/min (ref >60)
Glucose, Bld: 128 mg/dL — ABNORMAL HIGH (ref 65–99)
Potassium: 3.3 mmol/L — ABNORMAL LOW (ref 3.5–5.1)
SODIUM: 152 mmol/L — AB (ref 135–145)

## 2016-10-30 LAB — MAGNESIUM: MAGNESIUM: 2.2 mg/dL (ref 1.7–2.4)

## 2016-10-30 MED ORDER — BLISTEX MEDICATED EX OINT
TOPICAL_OINTMENT | CUTANEOUS | Status: DC | PRN
  Filled 2016-10-30 (×2): qty 6.3

## 2016-10-30 NOTE — Progress Notes (Signed)
Attempted Pressure support trial on pt but patient went apneic, resumed at previous [facility] settings.

## 2016-10-30 NOTE — Progress Notes (Signed)
[facility] - [place] at [facility]   PATIENT NAME: Kevin Holden    MR#:  [id number]  DATE OF BIRTH:  02-07-1951  SUBJECTIVE:  Remains intubated and on the vent  on propofol   Had trach 10/28/16 and PEG 10/29/16  REVIEW OF SYSTEMS:   Review of Systems  Unable to perform ROS: Intubated   Intubated and sedated  DRUG ALLERGIES:   Allergies  Allergen Reactions  . Aspirin Other (See Comments)    Unknown  . Celecoxib Other (See Comments)    Reaction: Unknown  . Nsaids Other (See Comments)    Reaction: Unknown    VITALS:  Blood pressure 123/89, pulse (!) 102, temperature 97.3 F (36.3 C), temperature source Axillary, resp. rate (!) 21, height 6' (1.829 m), weight 52.7 kg (116 lb 2.9 oz), SpO2 95 %.  PHYSICAL EXAMINATION:   Physical Exam  GENERAL:  66 y.o.-year-old patient lying in the bed with Critically ill EYES: Pupils equal, round, reactive to light and accommodation. No scleral icterus. Extraocular muscles intact.  HEENT: Head atraumatic, normocephalic. Oropharynx and nasopharynx clear. Tracheostomy tube in place NECK:  Supple, no jugular venous distention. No thyroid enlargement, no tenderness.  LUNGS: Normal breath sounds bilaterally, no wheezing, rales, rhonchi. No use of accessory muscles of respiration.  CARDIOVASCULAR: S1, S2 normal. No murmurs, rubs, or gallops.  ABDOMEN: Soft, nontender, nondistended. Bowel sounds present. No organomegaly or mass. PEG tube in place. EXTREMITIES: No cyanosis, clubbing or edema b/l.    NEUROLOGIC: vent , alert, eyes open, shakes hand with me. PSYCHIATRIC: on the vent SKIN: No obvious rash, lesion, or ulcer.   LABORATORY PANEL:  CBC  Recent Labs Lab 10/30/16 0458  WBC 17.2*  HGB 11.1*  HCT 33.6*  PLT 239    Chemistries   Recent Labs Lab 10/30/16 0458  NA 152*  K 3.3*  CL 113*  CO2 31  GLUCOSE 128*  BUN 31*  CREATININE 0.93  CALCIUM 8.4*  MG 2.2   Cardiac Enzymes No results for  input(s): TROPONINI in the last 168 hours. RADIOLOGY:  No results found. ASSESSMENT AND PLAN:   [name] is a 66 y.o. black male Asthma/COPD, hypertension, prior history of pneumonia, who was [facility]18[facility]for evaluation of increasing shortness of breath. COPD exacerbation   # Acute on chronic respiratory failure with hypoxia due to COPD exacerbation and pneumonia Still needing full ventilatory support Nebs + IV steroids On propofol Unable to wean so far. Trach placed 10/28/16. [facility]  # Septic shock secondary to pneumonia Off pressors. Resolved   # COPD exacerbation due to acute bronchitis, was on levofloxacin Pulmicort, DuoNeb's, prednisone, Spiriva, follow clinically   # Acute kidney injury due to ATN from hypotension Resolved  # chronic Tobacco abuse.   Case discussed with Care Management/Social Worker. Management plans discussed with the patient, family and they are in agreement.  CODE STATUS: FULL  DVT Prophylaxis: Lovenox  TOTAL TIME TAKING CARE OF THIS PATIENT: 25 minutes.   POSSIBLE D/C IN  Few DAYS, DEPENDING ON CLINICAL CONDITION.  Note: This dictation was prepared with Dragon dictation along with smaller phrase technology. Any transcriptional errors that result from this process are unintentional.  Milagros LollSudini, Aria Pickrell R M.D on 10/30/2016 at 1:04 PM  Between 7am to 6pm - Pager - [phone]  After 6pm go to [email or web address] - Social research officer, governmentpassword EPAS [facility]  [facility] [place] [facility]  Office  [phone]  CC: Primary care physician; Leanna SatoMiles, Linda M,  MD

## 2016-10-30 NOTE — Progress Notes (Signed)
   10/30/16 1100  Vitals  Temp 97.3 F (36.3 C)  Temp Source Axillary  BP 117/82  MAP (mmHg) 94  Pulse Rate 90  ECG Heart Rate 97  Resp (!) 23  Oxygen Therapy  SpO2 95 %  O2 Device Ventilator  FiO2 (%) 35 %  Pulse Oximetry Type Continuous  End Tidal CO2 (EtCO2) 24  Termination of Wake Up Assessment (Document within 2 hours of WUA Start)  Follows commands Yes  Synchrony Yes  WUA Patient Response/RASS Score 0  Sedation outcome Resumed medications at 50%  Mechanical Ventilation  End Tidal CO2 23

## 2016-10-30 NOTE — Progress Notes (Signed)
PULMONARY / CRITICAL CARE MEDICINE   Name: Coston Mandato MRN: [id number] DOB: Dec 14, 1950    ADMISSION DATE:  10/15/2016    PT PROFILE:   66 y.o. male smoker intubated after failing BiPAP with diagnosis of AECOPD  MAJOR EVENTS/TEST RESULTS: 05/18 Admitted by Hospitalist service with acute on chronic respiratory failure deemed due to AECOPD 05/18 Echo:The estimated ejection   fraction was in the range of 55% to 60%. Wall motion was normal. Doppler parameters are con sistent with abnormal left ventricular relaxation (grade 1 diastolic dysfunction).  05/19 Transferred to ICU/SDU for BiPAP. Failed BiPAP and intubated 05/20 Renal US: No cause for acute renal failure identified. Both kidneys are relatively small, measuring less than 9 cm  05/21 very agitated on WUA. No weaning 05/22 Very agitated on WUA. Dexmedetomidine initiated 05/23 increased airflow obstruction. Precedex changed to propofol. Continued problems with agitation on WUA 05/24 persistent severe airflow obstruction. PSV as tolerated. 05/25 family meeting held with pts 3 daughters to discuss goals of care pts daughters stated they would like to proceed with tracheostomy and peg tube placement if indicated. They also wanted to change pts code status to limited code to include NO CPR, ACLS medications, or defibrillation and/or cardioversion 5/28 Met with family, discussed status and plan, continue to want trach.  5/29 Eldest daughter continues to want trach, confirms DNR status. ; failed weaning trial.  5/30; Failed weaning trial due to tachypnea and tachycardia within 15 minutes.   INDWELLING DEVICES:: ETT 05/19 >>  L IJ CVL 05/19 >>   MICRO DATA: MRSA PCR 05/19 >> NEG Urine 05/20 >> NEG  ANTIMICROBIALS:  Levofloxacin 05/19 >> 05/22 Ceftriaxone 05/22 >>05/25  REVIEW OF SYSTEMS: Unable to assess pt mechanically intubated  SUBJ: Pt sedated mechanically intubated  VITAL SIGNS: BP 109/73   Pulse 91   Temp 97.3 F (36.3  C) (Axillary)   Resp 15   Ht 6' (1.829 m)   Wt 116 lb 2.9 oz (52.7 kg)   SpO2 94%   BMI 15.76 kg/m   HEMODYNAMICS:    VENTILATOR SETTINGS: Vent Mode: PRVC FiO2 (%):  [35 %] 35 % Set Rate:  [14 bmp] 14 bmp Vt Set:  [550 mL-600 mL] 550 mL PEEP:  [5 cmH20] 5 cmH20 Plateau Pressure:  [15 cmH20] 15 cmH20  INTAKE / OUTPUT: I/O last 3 completed shifts: In: 345.1 [I.V.:345.1] Out: 2425 [Urine:2425]  PHYSICAL EXAMINATION: Gen: acutely ill frail appearing [name], mechanically intubated  Neuro: sedated but opens eyes, withdraws from painful stimulation, PERRL HEENT: supple, no JVD present  Lungs: rhonchi and diminished throughout with very prolonged exp phase, non labored Cardiovascular: sinus tach, s1s2, no M/R/G Abdomen: soft, +BS x4, soft, non tender, non distended  Musculoskeletal: normal tone, 1+ bilateral lower extremity edema Skin: intact no rashes or lesions  LABS:  BMET  Recent Labs Lab 10/28/16 0441 10/29/16 0550 10/30/16 0458  NA 144 147* 152*  K 3.9 3.8 3.3*  CL 102 107 113*  CO2 35* 32 31  BUN 34* 33* 31*  CREATININE 1.05 1.04 0.93  GLUCOSE 112* 117* 128*    Electrolytes  Recent Labs Lab 10/28/16 0441 10/29/16 0550 10/30/16 0458  CALCIUM 9.0 8.3* 8.4*  MG 2.3 2.2 2.2    CBC  Recent Labs Lab 10/28/16 0441 10/29/16 0550 10/30/16 0458  WBC 13.7* 15.8* 17.2*  HGB 12.9* 11.3* 11.1*  HCT 38.5* 34.5* 33.6*  PLT 265 230 239    ABG  Recent Labs Lab 10/26/16 0430  PHART 7.48*  PCO2ART 65*  PO2ART 57*    ASSESSMENT / PLAN:  PULMONARY A: Acute on chronic hypercarbic respiratory failure AECOPD with advanced COPD/emphysema.  Vent dependent, failed multiple weaning attempts. Intubated 5/19 Failed weaning multiple attempts.  P:   Cont vent support - settings reviewed and/or adjusted Cont vent bundle continue vent weaning.  --Transfer to LTAC after 21 days on vent.   Hypertension, controlled -We'll continue to  monitor.   GASTROINTESTINAL SUP: enteral famotidine Cont TF protocol Gi consulted, PEG tube on 6/1; d/w gastroenterology.    ENDOCRINE A:   DM 2 Steroid induced hyperglycemia Cont SSI protocol    -[name] M.D. 10/30/2016

## 2016-10-31 LAB — CBC
HCT: 32.9 % — ABNORMAL LOW (ref 40.0–52.0)
Hemoglobin: 10.8 g/dL — ABNORMAL LOW (ref 13.0–18.0)
MCH: 27.4 pg (ref 26.0–34.0)
MCHC: 32.9 g/dL (ref 32.0–36.0)
MCV: 83.2 fL (ref 80.0–100.0)
PLATELETS: 265 10*3/uL (ref 150–440)
RBC: 3.95 MIL/uL — ABNORMAL LOW (ref 4.40–5.90)
RDW: 14.9 % — AB (ref 11.5–14.5)
WBC: 15.6 10*3/uL — ABNORMAL HIGH (ref 3.8–10.6)

## 2016-10-31 LAB — BASIC METABOLIC PANEL
Anion gap: 10 (ref 5–15)
BUN: 29 mg/dL — AB (ref 6–20)
CALCIUM: 8.9 mg/dL (ref 8.9–10.3)
CO2: 31 mmol/L (ref 22–32)
Chloride: 114 mmol/L — ABNORMAL HIGH (ref 101–111)
Creatinine, Ser: 0.86 mg/dL (ref 0.61–1.24)
GFR calc Af Amer: >60 mL/min (ref >60)
GLUCOSE: 150 mg/dL — AB (ref 65–99)
Potassium: 3.4 mmol/L — ABNORMAL LOW (ref 3.5–5.1)
Sodium: 155 mmol/L — ABNORMAL HIGH (ref 135–145)

## 2016-10-31 LAB — GLUCOSE, CAPILLARY
Glucose-Capillary: 80 mg/dL (ref 65–99)
Glucose-Capillary: 141 mg/dL — ABNORMAL HIGH (ref 65–99)
Glucose-Capillary: 193 mg/dL — ABNORMAL HIGH (ref 65–99)
Glucose-Capillary: 141 mg/dL — ABNORMAL HIGH (ref 65–99)
Glucose-Capillary: 134 mg/dL — ABNORMAL HIGH (ref 65–99)
Glucose-Capillary: 139 mg/dL — ABNORMAL HIGH (ref 65–99)

## 2016-10-31 LAB — MAGNESIUM: MAGNESIUM: 2.4 mg/dL (ref 1.7–2.4)

## 2016-10-31 LAB — PHOSPHORUS: PHOSPHORUS: 2.8 mg/dL (ref 2.5–4.6)

## 2016-10-31 MED ORDER — VITAL 1.5 CAL PO LIQD
1000.0000 mL | ORAL | Status: DC
  Administered 2016-10-31 – 2016-11-08 (×8): 1000 mL

## 2016-10-31 MED ORDER — POTASSIUM CHLORIDE 20 MEQ PO PACK
40.0000 meq | PACK | Freq: Once | ORAL | Status: AC
  Administered 2016-10-31: 40 meq via ORAL
  Filled 2016-10-31: qty 2

## 2016-10-31 MED ORDER — FREE WATER
200.0000 mL | Freq: Three times a day (TID) | Status: DC
  Administered 2016-10-31 – 2016-11-02 (×6): 200 mL

## 2016-10-31 NOTE — Progress Notes (Signed)
Received verbal order to place patient on trach collar as tolerated. Patient on 35%. Patient responding appropriately to questions. No distress noted.

## 2016-10-31 NOTE — Progress Notes (Signed)
  Lucilla Lame, MD [facility]   [address] [place], [place] [address] Phone: [phone] Fax : [phone]   Subjective: This patient had a PEG tube placed on Friday. The patient has not started tube feeding yet. There is no report of any complications over the weekend with the patient's PEG tube site. The patient is alert and shaking his head to questions at the present time.    Objective: Vital signs in last 24 hours: Vitals:   10/31/16 0600 10/31/16 0800 10/31/16 0836 10/31/16 0838  BP: 137/80 105/78    Pulse: 93 84    Resp: 17 14    Temp:      TempSrc:      SpO2: 95% 96% 96% 96%  Weight:      Height:       Weight change: -3 lb 8.4 oz (-1.6 kg)  Intake/Output Summary (Last 24 hours) at 10/31/16 1155 Last data filed at 10/31/16 0800  Gross per 24 hour  Intake            43.51 ml  Output             1075 ml  Net         -1031.49 ml     Exam: Heart:: Regular rate and rhythm, S1 and S2 heard Lungs: normal, decreased breath sounds bilaterally Abdomen: soft, nontender, normal bowel sounds, without rebound or guarding   Lab Results: _0 @ Micro Results: No results found for this or any previous visit (from the past 240 hour(s)). Studies/Results: No results found. Medications: I have reviewed the patient's current medications. Scheduled Meds: . budesonide (PULMICORT) nebulizer solution  0.25 mg Nebulization Q6H  . chlorhexidine gluconate (MEDLINE KIT)  15 mL Mouth Rinse BID  . docusate  100 mg Per Tube BID  . enoxaparin (LOVENOX) injection  40 mg Subcutaneous Q24H  . famotidine  20 mg Per Tube Daily  . gabapentin  400 mg Per Tube TID  . insulin aspart  0-15 Units Subcutaneous Q4H  . ipratropium-albuterol  3 mL Nebulization Q6H  . mouth rinse  15 mL Mouth Rinse 10 times per day  . methylPREDNISolone (SOLU-MEDROL) injection  40 mg Intravenous Q24H  . multivitamin  15 mL Per Tube Daily  . polyethylene glycol  17 g Per Tube Daily  . sodium chloride flush   10-40 mL Intracatheter Q12H   Continuous Infusions: . dexmedetomidine (PRECEDEX) IV infusion Stopped (10/22/16 1900)  . propofol (DIPRIVAN) infusion Stopped (10/31/16 0814)   PRN Meds:.acetaminophen **OR** [DISCONTINUED] acetaminophen, albuterol, fentaNYL (SUBLIMAZE) injection, hydrALAZINE, lip balm, metoprolol tartrate, midazolam, [DISCONTINUED] ondansetron **OR** ondansetron (ZOFRAN) IV, sennosides, sodium chloride flush   Assessment: Active Problems:   Acute on chronic respiratory failure with hypoxemia (HCC)   Protein-calorie malnutrition, severe   Feeding problem    Plan: This patient is status post PEG tube after having a tracheostomy for respiratory failure. The patient has a soft abdomen and exudate appears good. The patient can be started on tube feedings. Contact me if the patient has any complications from the tube. Otherwise I will sign off at this time.   LOS: 16 days   Verdell Dykman 10/31/2016, 11:55 AM

## 2016-10-31 NOTE — Progress Notes (Signed)
[facility] [place] at [facility]   PATIENT NAME: Kevin BurrowJoseph Mcbain    MR#:  [id number]  DATE OF BIRTH:  Jun 18, 1950  SUBJECTIVE:  Remains  on the vent thru tracheostomy  on propofol   Had trach 10/28/16 and PEG 10/29/16.   REVIEW OF SYSTEMS:   Review of Systems  Unable to perform ROS: Intubated   Intubated and sedated  DRUG ALLERGIES:   Allergies  Allergen Reactions  . Aspirin Other (See Comments)    Unknown  . Celecoxib Other (See Comments)    Reaction: Unknown  . Nsaids Other (See Comments)    Reaction: Unknown    VITALS:  Blood pressure 105/78, pulse 84, temperature 97.6 F (36.4 C), temperature source Axillary, resp. rate 14, height 6' (1.829 m), weight 51.1 kg (112 lb 10.5 oz), SpO2 96 %.  PHYSICAL EXAMINATION:   Physical Exam  GENERAL:  66 y.o.-year-old patient lying in the bed with Critically ill EYES: Pupils equal, round, reactive to light and accommodation. No scleral icterus. Extraocular muscles intact.  HEENT: Head atraumatic, normocephalic. Oropharynx and nasopharynx clear. Tracheostomy tube in place NECK:  Supple, no jugular venous distention. No thyroid enlargement, no tenderness.  LUNGS: Normal breath sounds bilaterally, no wheezing, rales, rhonchi. No use of accessory muscles of respiration.  CARDIOVASCULAR: S1, S2 normal. No murmurs, rubs, or gallops.  ABDOMEN: Soft, nontender, nondistended. Bowel sounds present. No organomegaly or mass. PEG tube in place. EXTREMITIES: No cyanosis, clubbing or edema b/l.    NEUROLOGIC: vent , alert, eyes open, shakes hand with me. PSYCHIATRIC: on the vent SKIN: No obvious rash, lesion, or ulcer.   LABORATORY PANEL:  CBC  Recent Labs Lab 10/30/16 0458  WBC 17.2*  HGB 11.1*  HCT 33.6*  PLT 239    Chemistries   Recent Labs Lab 10/30/16 0458  NA 152*  K 3.3*  CL 113*  CO2 31  GLUCOSE 128*  BUN 31*  CREATININE 0.93  CALCIUM 8.4*  MG 2.2   Cardiac Enzymes No results for  input(s): TROPONINI in the last 168 hours. RADIOLOGY:  No results found. ASSESSMENT AND PLAN:   Mr. Kevin BurrowJoseph Sutphin is a 66 y.o. black male Asthma/COPD, hypertension, prior history of pneumonia, who [facility]for evaluation of increasing shortness of breath. COPD exacerbation   # Acute on chronic respiratory failure with hypoxia due to COPD exacerbation and pneumonia Still needing full ventilatory support Nebs and IV steroids On propofol Unable to wean. Trach placed 10/28/16. [facility]  # Septic shock secondary to pneumonia Off pressors. Resolved   # COPD exacerbation due to acute bronchitis, was on levofloxacin Pulmicort, DuoNeb's, prednisone, Spiriva, follow clinically   # Acute kidney injury due to ATN from hypotension Resolved  # Chronic Tobacco abuse  Case discussed with Care Management/Social Worker. Management plans discussed with the patient, family and they are in agreement.  CODE STATUS: FULL  DVT Prophylaxis: Lovenox  TOTAL TIME TAKING CARE OF THIS PATIENT: 25 minutes.   Note: This dictation was prepared with Dragon dictation along with smaller phrase technology. Any transcriptional errors that result from this process are unintentional.  Milagros LollSudini, Ciearra Rufo R M.D on 10/31/2016 at 10:05 AM  Between 7am to 6pm - Pager - [phone]  After 6pm go to [email or web address] - Social research officer, governmentpassword EPAS [facility]  [facility] [place] [facility]  Office  [phone]  CC: Primary care physician; Leanna SatoMiles, Linda M, MD

## 2016-10-31 NOTE — Progress Notes (Signed)
PULMONARY / CRITICAL CARE MEDICINE   Name: Wandell Scullion MRN: [id number] DOB: 1950/12/27    ADMISSION DATE:  10/15/2016    PT PROFILE:   66 y.o. male smoker intubated after failing BiPAP with diagnosis of AECOPD  MAJOR EVENTS/TEST RESULTS: 05/18 Admitted by Hospitalist service with acute on chronic respiratory failure deemed due to AECOPD 05/18 Echo:The estimated ejection   fraction was in the range of 55% to 60%. Wall motion was normal. Doppler parameters are con sistent with abnormal left ventricular relaxation (grade 1 diastolic dysfunction).  05/19 Transferred to ICU/SDU for BiPAP. Failed BiPAP and intubated 05/20 Renal US: No cause for acute renal failure identified. Both kidneys are relatively small, measuring less than 9 cm  05/21 very agitated on WUA. No weaning 05/22 Very agitated on WUA. Dexmedetomidine initiated 05/23 increased airflow obstruction. Precedex changed to propofol. Continued problems with agitation on WUA 05/24 persistent severe airflow obstruction. PSV as tolerated. 05/25 family meeting held with pts 3 daughters to discuss goals of care pts daughters stated they would like to proceed with tracheostomy and peg tube placement if indicated. They also wanted to change pts code status to limited code to include NO CPR, ACLS medications, or defibrillation and/or cardioversion 5/28 Met with family, discussed status and plan, continue to want trach.  5/29 Eldest daughter continues to want trach, confirms DNR status. ; failed weaning trial.  5/30; Failed weaning trial due to tachypnea and tachycardia within 15 minutes.  06/2; failed weaning.   INDWELLING DEVICES:: ETT 05/19 >>  L IJ CVL 05/19 >>   MICRO DATA: MRSA PCR 05/19 >> NEG Urine 05/20 >> NEG  ANTIMICROBIALS:  Levofloxacin 05/19 >> 05/22 Ceftriaxone 05/22 >>05/25  REVIEW OF SYSTEMS: Unable to assess pt mechanically intubated  SUBJ: Pt sedated mechanically intubated  VITAL SIGNS: BP 106/77   Pulse  89   Temp 97.9 F (36.6 C) (Axillary)   Resp 13   Ht 6' (1.829 m)   Wt 112 lb 10.5 oz (51.1 kg)   SpO2 98%   BMI 15.28 kg/m   HEMODYNAMICS:    VENTILATOR SETTINGS: Vent Mode: PSV FiO2 (%):  [35 %] 35 % Set Rate:  [14 bmp] 14 bmp Vt Set:  [550 mL] 550 mL PEEP:  [5 cmH20-8 cmH20] 8 cmH20 Pressure Support:  [12 cmH20] 12 cmH20 Plateau Pressure:  [14 cmH20-15 cmH20] 14 cmH20  INTAKE / OUTPUT: I/O last 3 completed shifts: In: 141.2 [I.V.:141.2] Out: 2000 [Urine:2000]  PHYSICAL EXAMINATION: Gen: acutely ill frail appearing [name], mechanically intubated  Neuro: Patient awake, nods yes and no to questions, follows commands. HEENT: supple, no JVD present  Lungs: rhonchi and diminished throughout with very prolonged exp phase, non labored Cardiovascular: sinus tach, s1s2, no M/R/G Abdomen: soft, +BS x4, soft, non tender, non distended  Musculoskeletal: normal tone, 1+ bilateral lower extremity edema Skin: intact no rashes or lesions  LABS:  BMET  Recent Labs Lab 10/29/16 0550 10/30/16 0458 10/31/16 1118  NA 147* 152* 155*  K 3.8 3.3* 3.4*  CL 107 113* 114*  CO2 32 31 31  BUN 33* 31* 29*  CREATININE 1.04 0.93 0.86  GLUCOSE 117* 128* 150*    Electrolytes  Recent Labs Lab 10/29/16 0550 10/30/16 0458 10/31/16 1118  CALCIUM 8.3* 8.4* 8.9  MG 2.2 2.2 2.4  PHOS  --   --  2.8    CBC  Recent Labs Lab 10/29/16 0550 10/30/16 0458 10/31/16 1118  WBC 15.8* 17.2* 15.6*  HGB 11.3* 11.1* 10.8*  HCT 34.5* 33.6* 32.9*  PLT 230 239 265    ABG  Recent Labs Lab 10/26/16 0430  PHART 7.48*  PCO2ART 65*  PO2ART 57*    ASSESSMENT / PLAN:   Acute on chronic hypercarbic respiratory failure AECOPD with advanced COPD/emphysema.  Vent dependent, failed multiple weaning attempts. Intubated 5/19 Failed weaning multiple attempts.  P:   Cont vent support  -Will wean again today. Return to previous vent settings overnight. --Transfer to LTAC after 21 days on  vent.   Hypertension, controlled -We'll continue to monitor.  Hypernatremia. --Start free water replacement.   SUP: enteral famotidine Cont TF protocol Status post PEG tube on 6/1;  Discussed with gastroneurology, we'll start tube feedings.   ENDOCRINE A:   DM 2 Steroid induced hyperglycemia Cont SSI protocol    -[name] M.D. 10/31/2016

## 2016-11-01 ENCOUNTER — Encounter: Payer: Self-pay | Admitting: Gastroenterology

## 2016-11-01 DIAGNOSIS — L8990 Pressure ulcer of unspecified site, unspecified stage: Secondary | ICD-10-CM | POA: Insufficient documentation

## 2016-11-01 DIAGNOSIS — R29898 Other symptoms and signs involving the musculoskeletal system: Secondary | ICD-10-CM

## 2016-11-01 LAB — BASIC METABOLIC PANEL
Anion gap: 7 (ref 5–15)
BUN: 25 mg/dL — ABNORMAL HIGH (ref 6–20)
CALCIUM: 9.1 mg/dL (ref 8.9–10.3)
CO2: 31 mmol/L (ref 22–32)
Chloride: 116 mmol/L — ABNORMAL HIGH (ref 101–111)
Creatinine, Ser: 0.86 mg/dL (ref 0.61–1.24)
GFR calc Af Amer: >60 mL/min (ref >60)
GFR calc non Af Amer: >60 mL/min (ref >60)
GLUCOSE: 149 mg/dL — AB (ref 65–99)
Potassium: 3.8 mmol/L (ref 3.5–5.1)
Sodium: 154 mmol/L — ABNORMAL HIGH (ref 135–145)

## 2016-11-01 LAB — GLUCOSE, CAPILLARY
Glucose-Capillary: 114 mg/dL — ABNORMAL HIGH (ref 65–99)
Glucose-Capillary: 118 mg/dL — ABNORMAL HIGH (ref 65–99)
Glucose-Capillary: 122 mg/dL — ABNORMAL HIGH (ref 65–99)
Glucose-Capillary: 125 mg/dL — ABNORMAL HIGH (ref 65–99)
Glucose-Capillary: 141 mg/dL — ABNORMAL HIGH (ref 65–99)
Glucose-Capillary: 147 mg/dL — ABNORMAL HIGH (ref 65–99)
Glucose-Capillary: 181 mg/dL — ABNORMAL HIGH (ref 65–99)

## 2016-11-01 LAB — CBC
HEMATOCRIT: 37 % — AB (ref 40.0–52.0)
Hemoglobin: 12.1 g/dL — ABNORMAL LOW (ref 13.0–18.0)
MCH: 27.6 pg (ref 26.0–34.0)
MCHC: 32.7 g/dL (ref 32.0–36.0)
MCV: 84.7 fL (ref 80.0–100.0)
Platelets: 258 10*3/uL (ref 150–440)
RBC: 4.37 MIL/uL — ABNORMAL LOW (ref 4.40–5.90)
RDW: 15.7 % — AB (ref 11.5–14.5)
WBC: 17.2 10*3/uL — AB (ref 3.8–10.6)

## 2016-11-01 MED ORDER — GABAPENTIN 250 MG/5ML PO SOLN
200.0000 mg | Freq: Three times a day (TID) | ORAL | Status: DC
  Administered 2016-11-01 – 2016-11-09 (×24): 200 mg
  Filled 2016-11-01 (×25): qty 4

## 2016-11-01 MED ORDER — PRO-STAT SUGAR FREE PO LIQD
30.0000 mL | Freq: Every day | ORAL | Status: DC
  Administered 2016-11-01 – 2016-11-08 (×8): 30 mL

## 2016-11-01 MED ORDER — STERILE WATER FOR INJECTION IJ SOLN
INTRAMUSCULAR | Status: AC
  Filled 2016-11-01: qty 10

## 2016-11-01 MED ORDER — ALTEPLASE 2 MG IJ SOLR
2.0000 mg | Freq: Once | INTRAMUSCULAR | Status: AC
  Administered 2016-11-01: 2 mg
  Filled 2016-11-01: qty 2

## 2016-11-01 MED ORDER — DEXTROSE 5 % IV SOLN
INTRAVENOUS | Status: AC
  Administered 2016-11-01: 11:00:00 via INTRAVENOUS

## 2016-11-01 MED ORDER — PREDNISONE 20 MG PO TABS
20.0000 mg | ORAL_TABLET | Freq: Every day | ORAL | Status: DC
  Administered 2016-11-02: 20 mg via ORAL
  Filled 2016-11-01: qty 1

## 2016-11-01 NOTE — Evaluation (Signed)
Physical Therapy Evaluation Patient Details Name: Kevin Holden MRN: [id number] DOB: 08-Jun-1950 Today's Date: 11/01/2016   History of Present Illness  presented to ER secondary to progressive SOB; admitted with acute/chronic respiratory failure with hypoxemia secondary to asthma/COPD.  Hospital course significant for intubation (5/19) with failure to extubate (complicated by intermittent agitation), status post trach placement 5/31 with transition to trach collar 6/3.  PEG placed 6/1.  Clinical Impression  Upon evaluation, patient alert and oriented; follows all commands and demonstrates excellent effort with all evaluation components.  Bilat UE/LE strength globally fatigued and deconditioned with noted active ROM limitations (due to strength deficits).  Currently requiring mod/max assist for supine/sit; max assist +2 for sit/stand, basic transfer and very short-distance (bed/chair) gait with RW.  Poor sitting and standing balance with significant difficulty maintaining postural extension in all upright postures due to poor muscular endurance; however, makes strong effort to correct with cuing from therapist. Vitals stable and WFL; patient very pleased with his progress this session.  Anticipate consistent effort and participation. Would benefit from skilled PT to address above deficits and promote optimal return to PLOF; recommend transition to acute inpatient rehab upon discharge for high-intensity, post-acute rehab services.      Follow Up Recommendations CIR    Equipment Recommendations  Rolling walker with 5" wheels    Recommendations for Other Services       Precautions / Restrictions Precautions Precautions: Fall Precaution Comments: NPO-trach/PEG Restrictions Weight Bearing Restrictions: No      Mobility  Bed Mobility Overal bed mobility: Needs Assistance Bed Mobility: Supine to Sit     Supine to sit: Mod assist;Max assist        Transfers Overall transfer level:  Needs assistance Equipment used: Rolling walker (2 wheeled) Transfers: Sit to/from Stand Sit to Stand: Mod assist;Max assist;+2 physical assistance         General transfer comment: assist for lift off and standing balance  Ambulation/Gait Ambulation/Gait assistance: Max assist;+2 physical assistance Ambulation Distance (Feet): 5 Feet Assistive device: Rolling walker (2 wheeled)       General Gait Details: forward flexed posture; max assist from therapist for weight shift and LE advancement.  Difficulty sustaining full postural extension due to fatigue with upright functional activities.  Poor balance  Stairs            Wheelchair Mobility    Modified Rankin (Stroke Patients Only)       Balance Overall balance assessment: Needs assistance Sitting-balance support: No upper extremity supported;Feet supported Sitting balance-Leahy Scale: Fair Sitting balance - Comments: min/mod assist to maintain with downward gaze, kyphosis and forward trunk flexion Postural control: Posterior lean Standing balance support: Bilateral upper extremity supported Standing balance-Leahy Scale: Poor Standing balance comment: max/dep assist from therapist for sustained postural extension and standing balance                             Pertinent Vitals/Pain Pain Assessment: No/denies pain    Home Living Family/patient expects to be discharged to:: Private residence Living Arrangements: Alone Available Help at Discharge: Family Type of Home: House Home Access: Stairs to enter Entrance Stairs-Rails: Right Entrance Stairs-Number of Steps: 2 Home Layout: Two level;Able to live on main level with bedroom/bathroom Home Equipment: Gilmer Mor - single point      Prior Function Level of Independence: Independent         Comments: Indep with ADLs, household and community mobility; intermittent use of SPC.  +  driving.  No home O2.     Hand Dominance        Extremity/Trunk  Assessment   Upper Extremity Assessment Upper Extremity Assessment: Overall WFL for tasks assessed (globally weak and deconditioned, proximal > distal, at least 3-/5)    Lower Extremity Assessment Lower Extremity Assessment:  (globally weak and deconditioned, 3-/5 throughout except bilat ankle DF only 1/5.  Able to achieve neutral ankle DF with mild passive stretching)       Communication   Communication: Tracheostomy (communicates needs via mouthing and gestures)  Cognition Arousal/Alertness: Awake/alert Behavior During Therapy: WFL for tasks assessed/performed Overall Cognitive Status: Within Functional Limits for tasks assessed                                        General Comments      Exercises Other Exercises Other Exercises: Supine LE therex, 1x10, act assist ROM: ankle pumps with end-range PF stretching, heel slides with resisted extension, hip abduct/adduct. Other Exercises: Postural extension therex in unsupported sitting, mod assist for balance.  Able to initiate correction to full extension, but unable to maintain >10-15 seconds due to poor muscular endurance   Assessment/Plan    PT Assessment Patient needs continued PT services  PT Problem List Decreased strength;Decreased range of motion;Decreased activity tolerance;Decreased knowledge of use of DME;Decreased balance;Decreased mobility;Decreased safety awareness;Decreased coordination;Decreased knowledge of precautions;Cardiopulmonary status limiting activity       PT Treatment Interventions DME instruction;Gait training;Stair training;Functional mobility training;Therapeutic activities;Therapeutic exercise;Balance training;Patient/family education    PT Goals (Current goals can be found in the Care Plan section)  Acute Rehab PT Goals Patient Stated Goal: to attempt OOB PT Goal Formulation: With patient Time For Goal Achievement: 11/15/16 Potential to Achieve Goals: Good    Frequency 7X/week    Barriers to discharge Decreased caregiver support      Co-evaluation               AM-PAC PT "6 Clicks" Daily Activity  Outcome Measure Difficulty turning over in bed (including adjusting bedclothes, sheets and blankets)?: Total Difficulty moving from lying on back to sitting on the side of the bed? : Total Difficulty sitting down on and standing up from a chair with arms (e.g., wheelchair, bedside commode, etc,.)?: Total Help needed moving to and from a bed to chair (including a wheelchair)?: Total Help needed walking in hospital room?: Total Help needed climbing 3-5 steps with a railing? : Total 6 Click Score: 6    End of Session Equipment Utilized During Treatment: Gait belt;Oxygen (trach collar with aerosolized O2) Activity Tolerance: Patient tolerated treatment well Patient left: in chair;with call bell/phone within reach Nurse Communication: Mobility status PT Visit Diagnosis: Muscle weakness (generalized) (M62.81);Difficulty in walking, not elsewhere classified (R26.2)    Time: [id number] PT Time Calculation (min) (ACUTE ONLY): 28 min   Charges:   PT Evaluation $PT Eval Moderate Complexity: 1 Procedure PT Treatments $Therapeutic Exercise: 8-22 mins   PT G Codes:        Dayanara Sherrill H. Manson PasseyBrown, PT, DPT, NCS 11/01/16, 5:06 PM [phone]

## 2016-11-01 NOTE — Progress Notes (Signed)
Nutrition Follow-up  DOCUMENTATION CODES:   Severe malnutrition in context of chronic illness  INTERVENTION:  1. Continue Vital 1.5 @ 45, add Pro-Stat 30mL daily Provides 1720 calories,87 grams protein, 832cc free water  NUTRITION DIAGNOSIS:   Malnutrition related to catabolic illness as evidenced by severe depletion of muscle mass, severe depletion of body fat. -ongoing  GOAL:   Patient will meet greater than or equal to 90% of their needs -meeting  MONITOR:   PO intake, Supplement acceptance, Labs, Weight trends  REASON FOR ASSESSMENT:   Other (Comment) (low BMI)    ASSESSMENT:   66 year old male with h/o HTN and COPD admitted for acute on chronic respiratory failure with hypoxemia.  Patient is now on 35% trach collar SLP eval today S/P PEG, NPO, tolerating tubefeeds Labs and medications reviewed: CBGs 147-181 Colace, MIralax, Prednisone, MVI liquid D5 @ 16700mL/hr --> 408 calories  Diet Order:     Skin:  Reviewed, no issues  Last BM:  10/29/2016 - type 6  Height:   Ht Readings from Last 1 Encounters:  10/16/16 6' (1.829 m)    Weight:   Wt Readings from Last 1 Encounters:  11/01/16 115 lb 1.3 oz (52.2 kg)    Ideal Body Weight:  80.9 kg  BMI:  Body mass index is 15.61 kg/m.  Estimated Nutritional Needs:   Kcal:  1550-1850 calories (30-35 cal/kg)  Protein:  72-88 grams (1.4-1.7 grams/kg)  Fluid:  1.5-1.8 L/day (30-35 ml/kg)  EDUCATION NEEDS:   Education needs no appropriate at this time  Dionne AnoWilliam M. Leanny Moeckel, MS, RD LDN Inpatient Clinical Dietitian Pager [phone]

## 2016-11-01 NOTE — Evaluation (Signed)
[facility] - Evaluation Patient Details  Name: Kevin Holden MRN: [id number] Date of Birth: February 20, 1951  Today's Date: 11/01/2016 Time: 1230-1330 SLP Time Calculation (min) (ACUTE ONLY): 60 min  Past Medical History:  Past Medical History:  Diagnosis Date  . Asthma   . COPD (chronic obstructive pulmonary disease) (HCC)   . HTN (hypertension)   . Pneumonia    Past Surgical History:  Past Surgical History:  Procedure Laterality Date  . ABDOMINAL SURGERY    . BACK SURGERY    . PEG PLACEMENT N/A 10/29/2016   Procedure: PERCUTANEOUS ENDOSCOPIC GASTROSTOMY (PEG) PLACEMENT;  Surgeon: Midge Minium, MD;  Location: [facility] ENDOSCOPY;  Service: Endoscopy;  Laterality: N/A;  . PERIPHERAL VASCULAR CATHETERIZATION Right 05/27/2016   Procedure: Lower Extremity Angiography;  Surgeon: Annice Needy, MD;  Location: [facility] INVASIVE CV LAB;  Service: Cardiovascular;  Laterality: Right;  . PERIPHERAL VASCULAR CATHETERIZATION N/A 05/27/2016   Procedure: Abdominal Aortogram w/Lower Extremity;  Surgeon: Annice Needy, MD;  Location: [facility] INVASIVE CV LAB;  Service: Cardiovascular;  Laterality: N/A;  . PERIPHERAL VASCULAR CATHETERIZATION  05/27/2016   Procedure: Lower Extremity Intervention;  Surgeon: Annice Needy, MD;  Location: [facility] INVASIVE CV LAB;  Service: Cardiovascular;;  . TRACHEOSTOMY TUBE PLACEMENT N/A 10/28/2016   Procedure: TRACHEOSTOMY;  Surgeon: Bud Face, MD;  Location: [facility] ORS;  Service: ENT;  Laterality: N/A;  . VASCULAR SURGERY     2016 balloon in vein in right leg   HPI:  Pt is a 66 y/o male with past medical history significant for COPD and hypertension presents to the emergency department complaining of shortness of breath. The patient was recently discharged from this facility after treatment of COPD exacerbation. He was completed a prednisone taper as well as azithromycin but reports that for the last few days he has awakened at night feeling feverish. He he admits to a cough  but he reports that he is unable to bring anything up. The patient also admits to having swelling in his lower extremities. With this exacerbation he describes a feeling of tightness rising from his thighs into his abdomen and then into his chest where as on previous occasions he feels as if the swelling were occurring "from the top down". Upon discharge he was prescribed low-dose Lasix presumably for venous insufficiency but he states that this only helps sometimes. In the emergency department the patient was found to have oxygen saturations in the low 80s. He was placed on 4 L of oxygen via nasal cannula which improved his oxygen saturations somewhat. The next day, Transferred to ICU/SDU for BiPAP. Failed BiPAP and intubated w/ dx of AECOPD. Pt received a tracheostomy on 10/28/16; PEG placed on 10/29/16 for nutrition/hydration. Pt is currently weaning to trach collar O2 support. Pt has a Shiley 8 cuffed placed w/ cuff deflated at baseline prior to session beginning.   Assessment / Plan / Recommendation Clinical Impression  Pt presents as Aphonic and unable to attain sufficient airflow superiorly through the glottal area for voice and verbal communication w/ others. Due to the moderate+ amount of thick, tan, copious secretions, PMV placement was not attempted but instead, SLP assessed pt's ability to redirect airflow for phonation/verbalization through Finger Occlusion. Pt was instructed x3 to take a deep breath and give a loud "ah" or count w/ only a gravely phonation revealed x2. Suspect pt may not have enough room w/in th trachea for airflow around the trach itself d/t the size of the trach - Shiley #8.  SLP  Visit Diagnosis: Aphonia (R49.1)    SLP Assessment  Patient needs continued Speech Lanaguage Pathology Services (post further assessment for downsizing trach to #6)    Follow Up Recommendations  Skilled Nursing facility    Frequency and Duration min 2x/week (w/ increased frequency once able to use  PMV)  2 weeks    PMSV Trial PMSV was placed for: n/a - finger occlusion trials attempted w/ pt Able to redirect subglottic air through upper airway: No Able to Attain Phonation: No Voice Quality: Aphonic (1-2 gravely sounds) Able to Expectorate Secretions: No Level of Secretion Expectoration with PMSV: Tracheal (w/ finger occlusion) Breath Support for Phonation:  (n/a) Intelligibility: Unable to assess (comment) Respirations During Trial: 20 SpO2 During Trial: 95 % Pulse During Trial: 89 Behavior: Alert;Cooperative (; unable to verbally express self - attempted writing)   Tracheostomy Tube  Additional Tracheostomy Tube Assessment Trach Collar Period: ongoing trach collar trials today Secretion Description: thick, tan copious secretions at trach level when coughing Frequency of Tracheal Suctioning: x1 at beginning of session via catheter; via yaunkeur when coughing other times Level of Secretion Expectoration: Tracheal    Vent Dependency  Vent Dependent: No (weaning ongoing now; vent support PRN) FiO2 (%): 35 %    Cuff Deflation Trial  GO Tolerated Cuff Deflation: Yes Length of Time for Cuff Deflation Trial: noted already deflated at baseline as sessoin began Behavior: Alert;Cooperative;Smiling (somewhat distracted and unable to write effectively) Cuff Deflation Trial - Comments: already deflated at baseline        Watson,Katherine 11/01/2016, 6:11 PM [name], MS, CCC-SLP

## 2016-11-01 NOTE — Care Management (Signed)
Per previous progression discussions [facility] trial [facility] been attempted for at least 21-days. Today is day 17. He is now on Spontaneous vent support and has SLP evaluation pending. Per MD, he has not plans to de-cannulate patient this visit and patient may need a vent snf if LTAC will not be authorized. CSW updated. RNCM will continue to follow.

## 2016-11-01 NOTE — Progress Notes (Signed)
PULMONARY / CRITICAL CARE MEDICINE   Name: Kevin Holden MRN: [id number] DOB: [birth year]    ADMISSION DATE:  10/15/2016    PT PROFILE:   66 y.o. male smoker intubated after failing BiPAP with diagnosis of AECOPD  MAJOR EVENTS/TEST RESULTS: 05/18 Admitted by Hospitalist service with acute on chronic respiratory failure deemed due to AECOPD 05/18 Echo:The estimated ejection   fraction was in the range of 55% to 60%. Wall motion was normal. Doppler parameters are con sistent with abnormal left ventricular relaxation (grade 1 diastolic dysfunction).  05/19 Transferred to ICU/SDU for BiPAP. Failed BiPAP and intubated 05/20 Renal US: No cause for acute renal failure identified. Both kidneys are relatively small, measuring less than 9 cm  05/21 very agitated on WUA. No weaning 05/22 Very agitated on WUA. Dexmedetomidine initiated 05/23 increased airflow obstruction. Precedex changed to propofol. Continued problems with agitation on WUA 05/24 persistent severe airflow obstruction. PSV as tolerated. 05/25 family meeting held with pts 3 daughters to discuss goals of care pts daughters stated they would like to proceed with tracheostomy and peg tube placement if indicated. They also wanted to change pts code status to limited code to include NO CPR, ACLS medications, or defibrillation and/or cardioversion 05/28 Met with family, discussed status and plan, continue to want trach.  05/29 Eldest daughter continues to want trach, confirms DNR status. ; failed weaning trial.  05/30 Failed weaning trial due to tachypnea and tachycardia within 15 minutes.  05/31 tracheostomy tube placed 06/01 PEG tube placed 06/02 failed weaning.   INDWELLING DEVICES:: ETT 05/19 >> 05/31 Trach tube 05/31 >>  L IJ CVL 05/19 >> 06/04  MICRO DATA: MRSA PCR 05/19 >> NEG Urine 05/20 >> NEG  ANTIMICROBIALS:  Levofloxacin 05/19 >> 05/22 Ceftriaxone 05/22 >>05/25   SUBJ: Comfortable on ATC. Alert, follows  commands  VITAL SIGNS: BP 127/79   Pulse 95   Temp 98.6 F (37 C) (Oral)   Resp 18   Ht 6' (1.829 m)   Wt 115 lb 1.3 oz (52.2 kg)   SpO2 97%   BMI 15.61 kg/m   HEMODYNAMICS:    VENTILATOR SETTINGS: Vent Mode: PSV FiO2 (%):  [30 %-35 %] 35 % PEEP:  [8 cmH20] 8 cmH20 Pressure Support:  [12 cmH20] 12 cmH20  INTAKE / OUTPUT: I/O last 3 completed shifts: In: 764.1 [I.V.:29.9; NG/GT:734.2] Out: 1750 [Urine:1750]  PHYSICAL EXAMINATION: Gen: Frail-appearing, no distress on ATC Neuro: No focal deficits. HEENT: supple, no JVD present  Lungs: No wheezes noted Cardiovascular: Regular, no murmurs Abdomen: soft, +BS x4, soft, non tender, non distended  Extremities: Warm, no edema Skin: intact no rashes or lesions  LABS:  BMET  Recent Labs Lab 10/30/16 0458 10/31/16 1118 11/01/16 0520  NA 152* 155* 154*  K 3.3* 3.4* 3.8  CL 113* 114* 116*  CO2 31 31 31   BUN 31* 29* 25*  CREATININE 0.93 0.86 0.86  GLUCOSE 128* 150* 149*    Electrolytes  Recent Labs Lab 10/29/16 0550 10/30/16 0458 10/31/16 1118 11/01/16 0520  CALCIUM 8.3* 8.4* 8.9 9.1  MG 2.2 2.2 2.4  --   PHOS  --   --  2.8  --     CBC  Recent Labs Lab 10/30/16 0458 10/31/16 1118 11/01/16 0520  WBC 17.2* 15.6* 17.2*  HGB 11.1* 10.8* 12.1*  HCT 33.6* 32.9* 37.0*  PLT 239 265 258    ABG  Recent Labs Lab 10/26/16 0430  PHART 7.48*  PCO2ART 65*  PO2ART 57*   CXR:  NNF  ASSESSMENT / PLAN: Acute on chronic hypercarbic respiratory failure AECOPD with end stage COPD/emphysema.  Tracheostomy status Prolonged mechanical ventilation  P:   Continue ATC as long as tolerated Ventilatory support PRN  Hypertension, controlled Continue current management  Hypernatremia. Add D5W 06/04 Monitor BMET intermittently Monitor I/Os Correct electrolytes as indicated  G tube status protein-calorie malnutrition  SUP: enteral famotidine Cont TF protocol  ENDOCRINE A:   DM 2 Steroid induced  hyperglycemia Cont SSI protocol   Profound deconditioning  PT eval and management 06/04   will need placement in rehabilitation/SNF   Merton Border, MD PCCM service Mobile [phone] Pager [phone] 11/01/2016 5:12 PM

## 2016-11-01 NOTE — Progress Notes (Signed)
Pt on trach collar at 35% O2 88% to 90% Speech Eval requested Report given to [place]

## 2016-11-01 NOTE — Progress Notes (Signed)
[facility] -  at [facility]   PATIENT NAME: Kevin BurrowJoseph Noecker    MR#:  [id number]  DATE OF BIRTH:  Sep 06, 1950  SUBJECTIVE:  Awake.    Had trach 10/28/16 and PEG 10/29/16.  On trach collar.  REVIEW OF SYSTEMS:   Review of Systems  Unable to perform ROS: Intubated   On vent  DRUG ALLERGIES:   Allergies  Allergen Reactions  . Aspirin Other (See Comments)    Unknown  . Celecoxib Other (See Comments)    Reaction: Unknown  . Nsaids Other (See Comments)    Reaction: Unknown    VITALS:  Blood pressure 126/71, pulse 91, temperature 98.9 F (37.2 C), temperature source Oral, resp. rate 16, height 6' (1.829 m), weight 52.2 kg (115 lb 1.3 oz), SpO2 (!) 86 %.  PHYSICAL EXAMINATION:   Physical Exam  GENERAL:  66 y.o.-year-old patient lying in the bed  EYES: Pupils equal, round, reactive to light and accommodation. No scleral icterus. Extraocular muscles intact.  HEENT: Head atraumatic, normocephalic. Oropharynx and nasopharynx clear. Tracheostomy tube in place NECK:  Supple, no jugular venous distention. No thyroid enlargement, no tenderness.  LUNGS: Normal breath sounds bilaterally, no wheezing, rales, rhonchi. No use of accessory muscles of respiration.  CARDIOVASCULAR: S1, S2 normal. No murmurs, rubs, or gallops.  ABDOMEN: Soft, nontender, nondistended. Bowel sounds present. No organomegaly or mass. PEG tube in place. EXTREMITIES: No cyanosis, clubbing or edema b/l.    NEUROLOGIC: vent , alert, eyes open, shakes hand with me. PSYCHIATRIC: awake.  SKIN: No obvious rash, lesion, or ulcer.   LABORATORY PANEL:  CBC  Recent Labs Lab 11/01/16 0520  WBC 17.2*  HGB 12.1*  HCT 37.0*  PLT 258    Chemistries   Recent Labs Lab 10/31/16 1118 11/01/16 0520  NA 155* 154*  K 3.4* 3.8  CL 114* 116*  CO2 31 31  GLUCOSE 150* 149*  BUN 29* 25*  CREATININE 0.86 0.86  CALCIUM 8.9 9.1  MG 2.4  --    Cardiac Enzymes No results for input(s):  TROPONINI in the last 168 hours. RADIOLOGY:  No results found. ASSESSMENT AND PLAN:   Mr. Kevin BurrowJoseph Bose is a 66 y.o. black male Asthma/COPD, hypertension, prior history of pneumonia, who was [facility]2018for evaluation of increasing shortness of breath. COPD exacerbation   # Acute on chronic respiratory failure with hypoxia due to COPD exacerbation and pneumonia Still needing full ventilatory support Nebs and IV steroids On propofol Unable to wean. Trach placed 10/28/16. [facility]  # Septic shock secondary to pneumonia Off pressors. Resolved  # Hyperkalemia. Started on free water through PEG tube.   # COPD exacerbation due to acute bronchitis, was on levofloxacin Pulmicort, DuoNeb's, prednisone, Spiriva, follow clinically   # Acute kidney injury due to ATN from hypotension Resolved  # Chronic Tobacco abuse  Case discussed with Care Management/Social Worker. Management plans discussed with the patient, family and they are in agreement.  CODE STATUS: FULL  DVT Prophylaxis: Lovenox  TOTAL TIME TAKING CARE OF THIS PATIENT: 25 minutes.   Note: This dictation was prepared with Dragon dictation along with smaller phrase technology. Any transcriptional errors that result from this process are unintentional.  Milagros LollSudini, Mortimer Bair R M.D on 11/01/2016 at 12:49 PM  Between 7am to 6pm - Pager - [phone]  After 6pm go to [email or web address] - Social research officer, governmentpassword EPAS [facility]  [facility]  Office  [phone]  CC: Primary care physician; Darreld McleanMiles, Linda  Jerilynn Mages, MD

## 2016-11-02 ENCOUNTER — Inpatient Hospital Stay: Payer: Medicare Other

## 2016-11-02 ENCOUNTER — Encounter (INDEPENDENT_AMBULATORY_CARE_PROVIDER_SITE_OTHER): Payer: Medicare Other

## 2016-11-02 ENCOUNTER — Ambulatory Visit (INDEPENDENT_AMBULATORY_CARE_PROVIDER_SITE_OTHER): Payer: Self-pay | Admitting: Vascular Surgery

## 2016-11-02 DIAGNOSIS — Z930 Tracheostomy status: Secondary | ICD-10-CM

## 2016-11-02 DIAGNOSIS — J411 Mucopurulent chronic bronchitis: Secondary | ICD-10-CM

## 2016-11-02 DIAGNOSIS — R5381 Other malaise: Secondary | ICD-10-CM

## 2016-11-02 LAB — GLUCOSE, CAPILLARY
Glucose-Capillary: 134 mg/dL — ABNORMAL HIGH (ref 65–99)
Glucose-Capillary: 137 mg/dL — ABNORMAL HIGH (ref 65–99)
Glucose-Capillary: 138 mg/dL — ABNORMAL HIGH (ref 65–99)
Glucose-Capillary: 154 mg/dL — ABNORMAL HIGH (ref 65–99)
Glucose-Capillary: 167 mg/dL — ABNORMAL HIGH (ref 65–99)
Glucose-Capillary: 169 mg/dL — ABNORMAL HIGH (ref 65–99)
Glucose-Capillary: 98 mg/dL (ref 65–99)

## 2016-11-02 MED ORDER — FENTANYL CITRATE (PF) 100 MCG/2ML IJ SOLN
25.0000 ug | INTRAMUSCULAR | Status: DC | PRN
Start: 2016-11-02 — End: 2016-11-02

## 2016-11-02 MED ORDER — PREDNISONE 10 MG PO TABS
10.0000 mg | ORAL_TABLET | Freq: Every day | ORAL | Status: DC
  Administered 2016-11-03: 10 mg via ORAL
  Filled 2016-11-02: qty 1

## 2016-11-02 MED ORDER — FREE WATER
200.0000 mL | Status: DC
  Administered 2016-11-02 – 2016-11-07 (×29): 200 mL

## 2016-11-02 MED ORDER — DEXTROSE 5 % IV SOLN: INTRAVENOUS | Status: DC

## 2016-11-02 MED ORDER — LEVOFLOXACIN 500 MG PO TABS
500.0000 mg | ORAL_TABLET | Freq: Every day | ORAL | Status: AC
  Administered 2016-11-02 – 2016-11-08 (×7): 500 mg
  Filled 2016-11-02 (×7): qty 1

## 2016-11-02 MED ORDER — DILTIAZEM 12 MG/ML ORAL SUSPENSION
30.0000 mg | Freq: Four times a day (QID) | ORAL | Status: DC
  Administered 2016-11-02 – 2016-11-09 (×29): 30 mg
  Filled 2016-11-02 (×32): qty 3

## 2016-11-02 MED ORDER — ONDANSETRON HCL 4 MG/2ML IJ SOLN: 4.0000 mg | Freq: Once | INTRAMUSCULAR | Status: DC | PRN

## 2016-11-02 NOTE — Progress Notes (Signed)
Inpatient Rehabilitation  PT has evaluated pt. and has recommended IP Rehab.  Per Marylene LandAngela Johnson's note dated 11/01/16, it appears that pt. may require LTACH vs vent SNF as pt. thus far has failed vent wean.  I will follow pt. at a distance for any potential role for IP Rehab.  I left a voice mail with this update for [employer]ela Johnson. Please call if questions.  Weldon PickingSusan Anush Wiedeman PT Inpatient Rehab Admissions Coordinator Cell [phone] Office [phone]

## 2016-11-02 NOTE — Progress Notes (Signed)
Physical Therapy Treatment Patient Details Name: Kevin Holden MRN: [id number] DOB: Dec 25, 1950 Today's Date: 11/02/2016    History of Present Illness 66 yo male pt presented to ER secondary to progressive SOB; admitted with acute/chronic respiratory failure with hypoxemia secondary to asthma/COPD.  Hospital course significant for intubation (5/19) with failure to extubate (complicated by intermittent agitation), status post trach placement 5/31 with transition to trach collar 6/3.  PEG placed 6/1.    PT Comments    Noted improvement in postural extension and functional mobility this date, requiring less physical assist for all functional activities (bed mobility, unsupported sitting balance and sit/stand) this date.  Patient currently maintaining sats on trach collar with aerosolized O2; not requiring return to vent at this point.  Patient remains very motivated to participate/progress and is excellent candidate for intensive rehab at CIR to promote optimal return to [facility].    Follow Up Recommendations  CIR     Equipment Recommendations  Rolling walker with 5" wheels    Recommendations for Other Services       Precautions / Restrictions Precautions Precautions: Fall Precaution Comments: NPO-trach/PEG Restrictions Weight Bearing Restrictions: No    Mobility  Bed Mobility Overal bed mobility: Needs Assistance Bed Mobility: Supine to Sit;Sit to Supine     Supine to sit: Mod assist Sit to supine: Max assist;Total assist;+2 for physical assistance      Transfers Overall transfer level: Needs assistance Equipment used: Rolling walker (2 wheeled) Transfers: Sit to/from Stand           General transfer comment: completing with less physical assist this date  Ambulation/Gait             General Gait Details: single LE step forward/backward with RW, mod assist +2-able to initiate weight shift and limb advancement with less assist from PT this date   Stairs            Wheelchair Mobility    Modified Rankin (Stroke Patients Only)       Balance Overall balance assessment: Needs assistance Sitting-balance support: No upper extremity supported;Feet supported Sitting balance-Leahy Scale: Fair   Postural control: Posterior lean Standing balance support: Bilateral upper extremity supported Standing balance-Leahy Scale: Poor                              Cognition   Behavior During Therapy: Impulsive Overall Cognitive Status: Difficult to assess                                 General Comments: pt generally able to follow commands but with minimal verbal cues at times      Exercises Other Exercises Other Exercises: Unsupported sitting edge of bed, worked to promote postural extension and increased thoraco/lumbar extension.  Unable to acheive neutral postural extension, but demonstrates marked improvement in control compared to previous session  Able to maintain with min/mod to periods of close sup this date. Other Exercises: Sit/stand x2 with RW, min/mod assist +2 for lift off, standing postural extension and balance.    General Comments        Pertinent Vitals/Pain Pain Assessment: Faces Faces Pain Scale: Hurts little more Pain Location: back, L hip? Pain Descriptors / Indicators: Grimacing Pain Intervention(s): Limited activity within patient's tolerance;Monitored during session;Repositioned    Home Living  Prior Function            PT Goals (current goals can now be found in the care plan section) Acute Rehab PT Goals Patient Stated Goal: get better PT Goal Formulation: With patient Time For Goal Achievement: 11/15/16 Potential to Achieve Goals: Good Progress towards PT goals: Progressing toward goals    Frequency    7X/week      PT Plan Current plan remains appropriate    Co-evaluation PT/OT/SLP Co-Evaluation/Treatment: Yes Reason for Co-Treatment:  Complexity of the patient's impairments (multi-system involvement);For patient/therapist safety;To address functional/ADL transfers PT goals addressed during session: Mobility/safety with mobility;Balance        AM-PAC PT "6 Clicks" Daily Activity  Outcome Measure  Difficulty turning over in bed (including adjusting bedclothes, sheets and blankets)?: Total Difficulty moving from lying on back to sitting on the side of the bed? : Total Difficulty sitting down on and standing up from a chair with arms (e.g., wheelchair, bedside commode, etc,.)?: Total Help needed moving to and from a bed to chair (including a wheelchair)?: Total Help needed walking in hospital room?: Total Help needed climbing 3-5 steps with a railing? : Total 6 Click Score: 6    End of Session   Activity Tolerance: Patient tolerated treatment well Patient left: in chair;with call bell/phone within reach   PT Visit Diagnosis: Muscle weakness (generalized) (M62.81);Difficulty in walking, not elsewhere classified (R26.2)     Time: [id number] PT Time Calculation (min) (ACUTE ONLY): 38 min  Charges:  $Therapeutic Activity: 23-37 mins                    G Codes:       Kevin Holden, PT, DPT, NCS 11/03/16, 12:01 AM [phone]

## 2016-11-02 NOTE — Progress Notes (Signed)
[facility] - [place] at [facility]   PATIENT NAME: Kevin Holden    MR#:  [id number]  DATE OF BIRTH:  1950-07-04  SUBJECTIVE:  Awake.    Had trach 10/28/16 and PEG 10/29/16.  On trach collar and TFs  REVIEW OF SYSTEMS:   Review of Systems  Unable to perform ROS: Intubated   On vent  DRUG ALLERGIES:   Allergies  Allergen Reactions  . Aspirin Other (See Comments)    Unknown  . Celecoxib Other (See Comments)    Reaction: Unknown  . Nsaids Other (See Comments)    Reaction: Unknown    VITALS:  Blood pressure (!) 148/85, pulse 100, temperature 98.5 F (36.9 C), temperature source Axillary, resp. rate (!) 24, height 6' (1.829 m), weight 52.2 kg (115 lb 1.3 oz), SpO2 95 %.  PHYSICAL EXAMINATION:   Physical Exam  GENERAL:  66 y.o.-year-old patient lying in the bed  EYES: Pupils equal, round, reactive to light and accommodation. No scleral icterus. Extraocular muscles intact.  HEENT: Head atraumatic, normocephalic. Oropharynx and nasopharynx clear. Tracheostomy tube in place NECK:  Supple, no jugular venous distention. No thyroid enlargement, no tenderness.  LUNGS: Normal breath sounds bilaterally, no wheezing, rales, rhonchi. No use of accessory muscles of respiration.  CARDIOVASCULAR: S1, S2 normal. No murmurs, rubs, or gallops.  ABDOMEN: Soft, nontender, nondistended. Bowel sounds present. No organomegaly or mass. PEG tube in place. EXTREMITIES: No cyanosis, clubbing or edema b/l.    NEUROLOGIC: alert and awake SKIN: No obvious rash, lesion, or ulcer.   LABORATORY PANEL:  CBC  Recent Labs Lab 11/01/16 0520  WBC 17.2*  HGB 12.1*  HCT 37.0*  PLT 258    Chemistries   Recent Labs Lab 10/31/16 1118 11/01/16 0520  NA 155* 154*  K 3.4* 3.8  CL 114* 116*  CO2 31 31  GLUCOSE 150* 149*  BUN 29* 25*  CREATININE 0.86 0.86  CALCIUM 8.9 9.1  MG 2.4  --    Cardiac Enzymes No results for input(s): TROPONINI in the last 168  hours. RADIOLOGY:  Dg Chest Port 1 View  Result Date: 11/02/2016 CLINICAL DATA:  66 year old male with acute respiratory failure. Subsequent encounter. EXAM: PORTABLE CHEST 1 VIEW COMPARISON:  10/28/2016. FINDINGS: Chronic obstructive pulmonary changes without segmental consolidation, pulmonary edema or pneumothorax. Tracheostomy tube tip midline. No plain film evidence of pulmonary malignancy. Calcified aorta. Heart size within normal limits. Left central line and nasogastric tube removed. IMPRESSION: Left central line and nasogastric tube have been removed. Otherwise no significant change. Electronically Signed   By: Lacy DuverneySteven  Olson M.D.   On: 11/02/2016 07:25   ASSESSMENT AND PLAN:   Kevin Holden is a 66 y.o. black male Asthma/COPD, hypertension, prior history of pneumonia, who [facility] evaluation of increasing shortness of breath. COPD exacerbation   # Acute on chronic respiratory failure with hypoxia due to COPD exacerbation and pneumonia Still needing full ventilatory support Nebs and  steroids Unable to wean. Trach placed 10/28/16. Trach colar.  # Septic shock secondary to pneumonia Off pressors. Resolved  # Hypernatremia. Started on free water through PEG tube.   # COPD exacerbation due to acute bronchitis, was on levofloxacin Pulmicort, DuoNeb's, prednisone, Spiriva, follow clinically   # Acute kidney injury due to ATN from hypotension Resolved  # Chronic Tobacco abuse   CODE STATUS: FULL  DVT Prophylaxis: Lovenox  TOTAL TIME TAKING CARE OF THIS PATIENT: 25 minutes.   Note: This dictation was  prepared with Dragon dictation along with smaller phrase technology. Any transcriptional errors that result from this process are unintentional.  Milagros Loll R M.D on 11/02/2016 at 3:25 PM  Between 7am to 6pm - Pager - [phone]  After 6pm go to [email or web address] - Social research officer, government  [facility] [place] [facility]  Office   [phone]  CC: Primary care physician; Leanna Sato, MD

## 2016-11-02 NOTE — Progress Notes (Signed)
PULMONARY / CRITICAL CARE MEDICINE   Name: Kevin Holden MRN: [id number] DOB: 07-05-1950    ADMISSION DATE:  10/15/2016    PT PROFILE:   66 y.o. male smoker intubated after failing BiPAP with diagnosis of AECOPD  MAJOR EVENTS/TEST RESULTS: 05/18 Admitted by Hospitalist service with acute on chronic respiratory failure deemed due to AECOPD 05/18 Echo:The estimated ejection   fraction was in the range of 55% to 60%. Wall motion was normal. Doppler parameters are con sistent with abnormal left ventricular relaxation (grade 1 diastolic dysfunction).  05/19 Transferred to ICU/SDU for BiPAP. Failed BiPAP and intubated 05/20 Renal US: No cause for acute renal failure identified. Both kidneys are relatively small, measuring less than 9 cm  05/21 very agitated on WUA. No weaning 05/22 Very agitated on WUA. Dexmedetomidine initiated 05/23 increased airflow obstruction. Precedex changed to propofol. Continued problems with agitation on WUA 05/24 persistent severe airflow obstruction. PSV as tolerated. 05/25 family meeting held with pts 3 daughters to discuss goals of care pts daughters stated they would like to proceed with tracheostomy and peg tube placement if indicated. They also wanted to change pts code status to limited code to include NO CPR, ACLS medications, or defibrillation and/or cardioversion 05/28 Met with family, discussed status and plan, continue to want trach.  05/29 Eldest daughter continues to want trach, confirms DNR status. ; failed weaning trial.  05/30 Failed weaning trial due to tachypnea and tachycardia within 15 minutes.  05/31 tracheostomy tube placed 06/01 PEG tube placed 06/02 failed weaning.  06/04 on trach collar all day 06/05 comfortable on trach collar. Copious purulent tracheal secretions  INDWELLING DEVICES:: ETT 05/19 >> 05/31 L IJ CVL 05/19 >> 06/04 Trach tube 05/31 >>   MICRO DATA: MRSA PCR 05/19 >> NEG Urine 05/20 >> NEG Resp 06/05 >>    ANTIMICROBIALS:  Levofloxacin 05/19 >> 05/22 Ceftriaxone 05/22 >>05/25 Levofloxacin 06/05 >>   SUBJ: Comfortable on ATC. Alert, follows commands. Copious purulent secretions  VITAL SIGNS: BP (!) 131/94 (BP Location: Right Arm)   Pulse (!) 113   Temp 98.5 F (36.9 C) (Axillary)   Resp 16   Ht 6' (1.829 m)   Wt 115 lb 1.3 oz (52.2 kg)   SpO2 93%   BMI 15.61 kg/m   HEMODYNAMICS:    VENTILATOR SETTINGS: FiO2 (%):  [35 %] 35 %  INTAKE / OUTPUT: I/O last 3 completed shifts: In: 0240 [I.V.:1950; NG/GT:1710] Out: 2225 [Urine:2225]  PHYSICAL EXAMINATION: Gen: No distress on ATC Neuro: No focal deficits, diffusely weak HEENT: supple, no JVD present  Lungs: No wheezes noted, scattered rhonchi Cardiovascular: Regular, no murmurs Abdomen: soft, +BS x4, soft, non tender, non distended  Extremities: Warm, no edema Skin: intact no rashes or lesions  LABS:  BMET  Recent Labs Lab 10/30/16 0458 10/31/16 1118 11/01/16 0520  NA 152* 155* 154*  K 3.3* 3.4* 3.8  CL 113* 114* 116*  CO2 31 31 31   BUN 31* 29* 25*  CREATININE 0.93 0.86 0.86  GLUCOSE 128* 150* 149*    Electrolytes  Recent Labs Lab 10/29/16 0550 10/30/16 0458 10/31/16 1118 11/01/16 0520  CALCIUM 8.3* 8.4* 8.9 9.1  MG 2.2 2.2 2.4  --   PHOS  --   --  2.8  --     CBC  Recent Labs Lab 10/30/16 0458 10/31/16 1118 11/01/16 0520  WBC 17.2* 15.6* 17.2*  HGB 11.1* 10.8* 12.1*  HCT 33.6* 32.9* 37.0*  PLT 239 265 258    ABG No  results for input(s): PHART, PCO2ART, PO2ART in the last 168 hours.    CXR: Emphysematous changes without acute infiltrates or edema  ASSESSMENT: Acute on chronic hypercarbic respiratory failure End stage COPD/emphysema AECOPD - resolving Tracheostomy status Prolonged mechanical ventilation - off vent X 48 hrs Purulent tracheobronchitis  Hypertension, marginally controlled Hypernatremia G tube status protein-calorie malnutrition  DM 2 with mild  hyperglycemia Steroid induced hyperglycemia Profound deconditioning   Continue nebulized bronchodilators Continue supplemental oxygen Decrease prednisone to 10 mg daily Respiratory culture ordered 06/05 Empiric levofloxacin initiated 06/05 Diltiazem initiated 06/05 Continue free water repletion Continue physical therapy OT ordered 06/05 Continue to monitor CBGs intermittently - consider SSI for CBGs greater than 180  Watch in SDU through today. If no major problems and not requiring mechanical ventilation, consider transfer to MedSurg 06/06  Merton Border, MD PCCM service Mobile [phone] Pager [phone] 11/02/2016 11:37 AM

## 2016-11-02 NOTE — Progress Notes (Addendum)
Per Dr Sung AmabileSimonds we will monitor CBGs and decrease prednisone, no SSI at this time.  OK for CBGs to be 200 or less.Ok to DC foley  Red edematous area under right side of trach,  Showed to Dr Sung AmabileSimonds he removed two sutures from right side and writer loosened ties and placed gauze under this area to protect.  Pt grimacing indicates this area is very tender

## 2016-11-02 NOTE — Care Management (Addendum)
MD paged for OT evaluation. PT is recommending CIR. I have notified Genie with [facility] to please review patient for this need. RNCM received notification from Weldon PickingSusan Blankenship with [facility] [phone] stating that she will watch patient's progress.

## 2016-11-02 NOTE — Evaluation (Signed)
Occupational Therapy Evaluation Patient Details Name: Kevin Holden MRN: [id number] DOB: 1950-09-26 Today's Date: 11/02/2016    History of Present Illness 66 yo male pt presented to ER secondary to progressive SOB; admitted with acute/chronic respiratory failure with hypoxemia secondary to asthma/COPD.  Hospital course significant for intubation (5/19) with failure to extubate (complicated by intermittent agitation), status post trach placement 5/31 with transition to trach collar 6/3.  PEG placed 6/1.   Clinical Impression   Pt seen for OT evaluation this date as part of co-tx with PT for pt safety/functional mobility. Pt alert and oriented, follows commands with occasional verbal cues to continue and demonstrates excellent effort and motivation to participate throughout. Pt globally weak and deconditioned with poor activity tolerance bilaterally UE/LE with AROM limitations due to strength deficits. However, pt seems to be improving from yesterday with AROM shoulder flexion as well as functional transfers (see 11/01/16 PT evaluation). Pt required mod-max assist x1 for supine> sit, max assist x2 for sit>supine, min-modx1 for sitting balance with brief periods of supervision and cuing for upright posture, and min-mod assist x2 for sit to stand with RW. Pt very pleased with his progress this date and eager to attempt walking next date if possible. Pt will benefit from skilled OT services to address noted impairments in strength, activity tolerance, and balance including therapeutic activity/exercise, balance training, education/training in self care tasks and AE for ADL, and energy conservation strategies in order to maximize return to PLOF and minimize risk of further decline and falls. Recommend CIR placement following hospitalization to further pt's progress towards PLOF.     Follow Up Recommendations  CIR    Equipment Recommendations  Other (comment) (defer to next venue of care)    Recommendations  for Other Services       Precautions / Restrictions Precautions Precautions: Fall Precaution Comments: NPO-trach/PEG Restrictions Weight Bearing Restrictions: No      Mobility Bed Mobility Overal bed mobility: Needs Assistance Bed Mobility: Supine to Sit;Sit to Supine     Supine to sit: Mod assist;Max assist Sit to supine: Max assist;+2 for physical assistance   General bed mobility comments: defer to PT note for additional detail  Transfers Overall transfer level: Needs assistance Equipment used: Rolling walker (2 wheeled) Transfers: Sit to/from Stand Sit to Stand: Min assist;Mod assist;+2 physical assistance;+2 safety/equipment         General transfer comment: defer to PT note for additional detail    Balance Overall balance assessment: Needs assistance     Sitting balance - Comments: defer to PT note for additional detail       Standing balance comment: defer to PT note for additional detail                           ADL either performed or assessed with clinical judgement   ADL Overall ADL's : Needs assistance/impaired Eating/Feeding: NPO (PEG tube)   Grooming: Set up;Bed level;Supervision/safety   Upper Body Bathing: Moderate assistance;Bed level   Lower Body Bathing: Maximal assistance;Bed level   Upper Body Dressing : Moderate assistance;Bed level   Lower Body Dressing: Maximal assistance;Bed level     Toilet Transfer Details (indicate cue type and reason): pt provided with set up for urinal at bed level         Functional mobility during ADLs: +2 for physical assistance;+2 for safety/equipment;Minimal assistance;Moderate assistance;Rolling walker General ADL Comments: pt generally mod assist for UB ADL, max assist for LB ADL  with occasional verbal cues to initiate/continue tasks     Vision Baseline Vision/History:  (no glasses present in room) Patient Visual Report: No change from baseline Vision Assessment?: No apparent  visual deficits     Perception     Praxis      Pertinent Vitals/Pain Pain Assessment: No/denies pain     Hand Dominance Right   Extremity/Trunk Assessment Upper Extremity Assessment Upper Extremity Assessment: Generalized weakness (globally weak and deconditioned, proximal > distal, grossly 3-/5 bilaterally, approx 75% AROM against gravity shoulder flexion, fair+ grip strength bilaterally)   Lower Extremity Assessment Lower Extremity Assessment: Defer to PT evaluation;Generalized weakness   Cervical / Trunk Assessment Cervical / Trunk Assessment: Kyphotic   Communication Communication Communication: Tracheostomy (communicates by mouthing words and gesturing)   Cognition Arousal/Alertness: Awake/alert Behavior During Therapy: WFL for tasks assessed/performed Overall Cognitive Status: Difficult to assess                                 General Comments: pt generally able to follow commands but with minimal verbal cues at times   General Comments  pt coughed up secretions which nursing suctioned    Exercises General Exercises - Upper Extremity Shoulder Flexion: AROM;Seated;Both;10 reps   Shoulder Instructions      Home Living Family/patient expects to be discharged to:: Private residence Living Arrangements: Alone Available Help at Discharge: Family Type of Home: House Home Access: Stairs to enter Secretary/administratorntrance Stairs-Number of Steps: 2 Entrance Stairs-Rails: Right Home Layout: Two level;Able to live on main level with bedroom/bathroom               Home Equipment: Gil[employer] Morane - single point          Prior Functioning/Environment          Comments: Indep with ADLs, household and community mobility; intermittent use of SPC.  + driving.  No home O2.        OT Problem List: Decreased strength;Decreased range of motion;Decreased cognition;Decreased safety awareness;Decreased activity tolerance;Impaired balance (sitting and/or standing);Decreased  knowledge of use of DME or AE      OT Treatment/Interventions: Self-care/ADL training;Therapeutic exercise;Therapeutic activities;Energy conservation;DME and/or AE instruction;Patient/family education;Balance training    OT Goals(Current goals can be found in the care plan section) Acute Rehab OT Goals Patient Stated Goal: get better OT Goal Formulation: With patient Time For Goal Achievement: 11/16/16 Potential to Achieve Goals: Good  OT Frequency: Min 3X/week   Barriers to D/C:            Co-evaluation PT/OT/SLP Co-Evaluation/Treatment: Yes Reason for Co-Treatment: For patient/therapist safety;To address functional/ADL transfers   OT goals addressed during session: Strengthening/ROM;ADL's and self-care      AM-PAC PT "6 Clicks" Daily Activity     Outcome Measure Help from another person eating meals?: Total (NPO - PEG tube) Help from another person taking care of personal grooming?: A Little Help from another person toileting, which includes using toliet, bedpan, or urinal?: A Lot Help from another person bathing (including washing, rinsing, drying)?: A Lot Help from another person to put on and taking off regular upper body clothing?: A Lot Help from another person to put on and taking off regular lower body clothing?: A Lot 6 Click Score: 12   End of Session Equipment Utilized During Treatment: Rolling walker;Oxygen Nurse Communication: Other (comment) (need for assist in changing soiled disposable briefs)  Activity Tolerance: Patient tolerated treatment well Patient left: in bed;with  call bell/phone within reach;with bed alarm set  OT Visit Diagnosis: Other abnormalities of gait and mobility (R26.89);Muscle weakness (generalized) (M62.81)                Time: 2440-1027 OT Time Calculation (min): 39 min Charges:  OT General Charges $OT Visit: 1 Procedure OT Evaluation $OT Eval Moderate Complexity: 1 Procedure G-Codes:     [name], MPH, MS, OTR/L ascom  [phone] 11/02/16, 4:19 PM

## 2016-11-03 LAB — BASIC METABOLIC PANEL
ANION GAP: 10 (ref 5–15)
BUN: 22 mg/dL — ABNORMAL HIGH (ref 6–20)
CO2: 30 mmol/L (ref 22–32)
Calcium: 9.2 mg/dL (ref 8.9–10.3)
Chloride: 108 mmol/L (ref 101–111)
Creatinine, Ser: 0.76 mg/dL (ref 0.61–1.24)
GFR calc Af Amer: >60 mL/min (ref >60)
GLUCOSE: 145 mg/dL — AB (ref 65–99)
Potassium: 3.6 mmol/L (ref 3.5–5.1)
Sodium: 148 mmol/L — ABNORMAL HIGH (ref 135–145)

## 2016-11-03 LAB — GLUCOSE, CAPILLARY
Glucose-Capillary: 128 mg/dL — ABNORMAL HIGH (ref 65–99)
Glucose-Capillary: 142 mg/dL — ABNORMAL HIGH (ref 65–99)
Glucose-Capillary: 133 mg/dL — ABNORMAL HIGH (ref 65–99)

## 2016-11-03 MED ORDER — POTASSIUM CHLORIDE 20 MEQ/15ML (10%) PO SOLN
40.0000 meq | Freq: Once | ORAL | Status: AC
  Administered 2016-11-03: 40 meq
  Filled 2016-11-03: qty 30

## 2016-11-03 MED ORDER — PREDNISONE 10 MG PO TABS
5.0000 mg | ORAL_TABLET | Freq: Every day | ORAL | Status: DC
  Administered 2016-11-04 – 2016-11-06 (×3): 5 mg via ORAL
  Filled 2016-11-03 (×3): qty 1

## 2016-11-03 MED ORDER — OXYCODONE HCL 5 MG/5ML PO SOLN
5.0000 mg | ORAL | Status: DC | PRN
  Administered 2016-11-03 – 2016-11-07 (×12): 5 mg
  Filled 2016-11-03 (×12): qty 5

## 2016-11-03 MED ORDER — DAKINS (1/4 STRENGTH) 0.125 % EX SOLN
Freq: Two times a day (BID) | CUTANEOUS | Status: AC
  Administered 2016-11-03: 1
  Administered 2016-11-04: 22:00:00
  Administered 2016-11-04 – 2016-11-05 (×3): 1
  Administered 2016-11-05: 08:00:00
  Filled 2016-11-03: qty 473

## 2016-11-03 NOTE — Progress Notes (Signed)
Patient found in floor next to bed. Back resting against bed and head against mattress. Patient did not appear in any distress at this time, no complaints of any pain or injury.  Patient was smiling and laughing at time of discovery.  Patient was found by another RN passing by room after coming out of another patient's room ([employer] [place]).  Patient was assisted back to bed and assessed by care RN and RN that found patient on floor.  No physical injury noted at this time and vitals signs remain stable.  Patient was educated not to get out of bed again.  Call bell within reach, bed in lowest position, bed alarm turned on, and personal alarm placed on patient.  Consulting civil engineerCharge RN and NP notified of event.

## 2016-11-03 NOTE — Plan of Care (Signed)
Problem: Education: Goal: Knowledge of disease or condition will improve Outcome: Completed/Met Date Met: 11/03/16 Family educated Goal: Knowledge of the prescribed therapeutic regimen will improve Outcome: Completed/Met Date Met: 11/03/16 Family educated

## 2016-11-03 NOTE — Progress Notes (Signed)
[facility] -  at [facility]   PATIENT NAME: Kevin BurrowJoseph Holden    MR#:  [id number]  DATE OF BIRTH:  1950-12-28  SUBJECTIVE:  Awake.    Had trach 10/28/16 and PEG 10/29/16.  Off vent. TFs  REVIEW OF SYSTEMS:   Review of Systems  Unable to perform ROS: Intubated    DRUG ALLERGIES:   Allergies  Allergen Reactions  . Aspirin Other (See Comments)    Unknown  . Celecoxib Other (See Comments)    Reaction: Unknown  . Nsaids Other (See Comments)    Reaction: Unknown    VITALS:  Blood pressure 98/66, pulse (!) 105, temperature 98.5 F (36.9 C), temperature source Axillary, resp. rate (!) 26, height 6' (1.829 m), weight 52.2 kg (115 lb 1.3 oz), SpO2 95 %.  PHYSICAL EXAMINATION:   Physical Exam  GENERAL:  66 y.o.-year-old patient lying in the bed  EYES: Pupils equal, round, reactive to light and accommodation. No scleral icterus. Extraocular muscles intact.  HEENT: Head atraumatic, normocephalic. Oropharynx and nasopharynx clear. Tracheostomy tube in place NECK:  Supple, no jugular venous distention. No thyroid enlargement, no tenderness.  LUNGS: Normal breath sounds bilaterally, no wheezing, rales, rhonchi. No use of accessory muscles of respiration.  CARDIOVASCULAR: S1, S2 normal. No murmurs, rubs, or gallops.  ABDOMEN: Soft, nontender, nondistended. Bowel sounds present. No organomegaly or mass. PEG tube in place. EXTREMITIES: No cyanosis, clubbing or edema b/l.    NEUROLOGIC: alert and awake SKIN: No obvious rash, lesion, or ulcer.   LABORATORY PANEL:  CBC  Recent Labs Lab 11/01/16 0520  WBC 17.2*  HGB 12.1*  HCT 37.0*  PLT 258    Chemistries   Recent Labs Lab 10/31/16 1118  11/03/16 0446  NA 155*  < > 148*  K 3.4*  < > 3.6  CL 114*  < > 108  CO2 31  < > 30  GLUCOSE 150*  < > 145*  BUN 29*  < > 22*  CREATININE 0.86  < > 0.76  CALCIUM 8.9  < > 9.2  MG 2.4  --   --   < > = values in this interval not displayed. Cardiac  Enzymes No results for input(s): TROPONINI in the last 168 hours. RADIOLOGY:  Dg Chest Port 1 View  Result Date: 11/02/2016 CLINICAL DATA:  66 year old male with acute respiratory failure. Subsequent encounter. EXAM: PORTABLE CHEST 1 VIEW COMPARISON:  10/28/2016. FINDINGS: Chronic obstructive pulmonary changes without segmental consolidation, pulmonary edema or pneumothorax. Tracheostomy tube tip midline. No plain film evidence of pulmonary malignancy. Calcified aorta. Heart size within normal limits. Left central line and nasogastric tube removed. IMPRESSION: Left central line and nasogastric tube have been removed. Otherwise no significant change. Electronically Signed   By: Lacy DuverneySteven  Olson M.D.   On: 11/02/2016 07:25   ASSESSMENT AND PLAN:   Mr. Kevin BurrowJoseph Maes is a 66 y.o. black male Asthma/COPD, hypertension, prior history of pneumonia, who [facility]for evaluation of increasing shortness of breath. COPD exacerbation   # Acute on chronic respiratory failure with hypoxia due to COPD exacerbation and pneumonia Nebs   Trach placed 10/28/16. Trach colar. Tracheostomy Cx with GPC in pairs and GNR. ON levaquin  # Septic shock secondary to pneumonia Off pressors. Resolved  # Hypernatremia. Started on free water through PEG tube. Resolved   # COPD exacerbation due to acute bronchitis Pulmicort, DuoNeb's, prednisone, Spiriva, follow clinically   # Acute kidney injury due to ATN from hypotension  Resolved  # Chronic Tobacco abuse   CODE STATUS: FULL  DVT Prophylaxis: Lovenox  TOTAL TIME TAKING CARE OF THIS PATIENT: 25 minutes.   Note: This dictation was prepared with Dragon dictation along with smaller phrase technology. Any transcriptional errors that result from this process are unintentional.  Milagros Loll R M.D on 11/03/2016 at 1:38 PM  Between 7am to 6pm - Pager - [phone]  After 6pm go to [email or web address] - Social research officer, government  [facility] [place]  [facility]  Office  [phone]  CC: Primary care physician; Leanna Sato, MD

## 2016-11-03 NOTE — Consult Note (Signed)
WOC Nurse wound consult note Reason for Consult: Device related pressure injury (unstageable) to right tracheostomy site, placed on 10/28/16.  Patient has prominent clavicle bony prominence that directly contacts the anchoring device of the trach site. Patient refuses to turn to left site at all.  This increases pressure to this area.  Discussed this with family that is at bedside this AM.   Unstageable pressure injury to sacrococcygeal region. Noted as moisture associated skin damage to sacrum on admission.    Wound type: Pressure Pressure Injury POA: Yes to sacrum Measurement: Right clavicle near trach site:  1 cm x 2.2 cm 100% adherent gray slough, necrotic odor.  Sacrum:  4 cm x 3.6 cm 100% thin adherent black eschar, necrotic odor.  Wound bed:100% devitalized tissue Drainage (amount, consistency, odor) Moderate purulence Periwound:Erytehma and induration to right clavicle site.  Tender to touch Sacrum periwound intact Dressing procedure/placement/frequency:Cleanse sacral wound with NS and pat gently dry.  Apply Dakin's moist gauze to wound bed. Cover with 4x4 gauze and ABD pad.  Change twice daily.   Cleanse right trach site wound with NS.  Trim silicone border from allevyn dressing and cut to fit beneath trach anchor.  Change daily to promote autolysis and pad/protect this area.   Encourage to change position/ne turned every two hours.   WOC team will follow Maple HudsonKaren Shuntia Exton RN BSN [facility] Pager [phone]

## 2016-11-03 NOTE — Care Management (Signed)
RNCM has updated Kevin Holden with [facility]. Plan will be to continue with LTAC auth unless patient makes significant rehab recovery in next couple of days. Kevin Holden will re-submit authorization tomorrow (day 20).

## 2016-11-03 NOTE — Progress Notes (Signed)
Inpatient Rehabilitation  I am continuing to follow patient's progress for disposition.  I note pt. is tolerating TC however notes indicate pt. with copious secretions.  Fae PippinMelissa Bowie will follow along Thursday/Friday in my absence and can be reached at [phone].  Please call if questions.  Weldon PickingSusan Raguel Kosloski PT Inpatient Rehab Admissions Coordinator Cell [phone] Office [phone]

## 2016-11-03 NOTE — Progress Notes (Signed)
Physical Therapy Treatment Patient Details Name: Kevin Holden MRN: [id number] DOB: 1950-07-17 Today's Date: 11/03/2016    History of Present Illness 66 yo male pt presented to ER secondary to progressive SOB; admitted with acute/chronic respiratory failure with hypoxemia secondary to asthma/COPD.  Hospital course significant for intubation (5/19) with failure to extubate (complicated by intermittent agitation), status post trach placement 5/31 with transition to trach collar 6/3.  PEG placed 6/1.    PT Comments    Patient continues to make notable improvement with each therapy treatment session. Demonstrates excellent effort and participation with all therapeutic activities.  Does appear somewhat frustrated by limited ability to verbally communicate at times; may consider use of white board to aid as needed. Treatment sessions continue to focus on postural extension and trunk control in all upright postures; patient able to maintain static sitting with close sup and static standing with min/mod assist +2 (RW) this date.  Able to correct to neutral with functional activity (dynamic reaching), but unable to maintain for sustained activities.  Patient very eager to initiate gait training; redirected to need for overall muscular strength/endurance prior to gait training of increased distances. Remains great candidate for CIR upon discharge; anticipate good progress with consistent and intensive multi-disciplinary care post-acute discharge. OOB in chair end of session; RN informed/aware.    Follow Up Recommendations  CIR     Equipment Recommendations  Rolling walker with 5" wheels    Recommendations for Other Services       Precautions / Restrictions Precautions Precautions: Fall Precaution Comments: NPO-trach/PEG Restrictions Weight Bearing Restrictions: No    Mobility  Bed Mobility Overal bed mobility: Needs Assistance Bed Mobility: Supine to Sit     Supine to sit: Mod assist         Transfers Overall transfer level: Needs assistance Equipment used: Rolling walker (2 wheeled)   Sit to Stand: Min assist;Mod assist;+2 physical assistance         General transfer comment: initiating transfer indep; assist for lift off and standing balance  Ambulation/Gait Ambulation/Gait assistance: Mod assist;Max assist;+2 physical assistance Ambulation Distance (Feet): 5 Feet Assistive device: Rolling walker (2 wheeled)       General Gait Details: able to indep advance bilat LEs this date, though with noted difficulty in foot placement.  Unable to maintain full postural extension throughout gait cycle; fatigues quickly   Stairs            Wheelchair Mobility    Modified Rankin (Stroke Patients Only)       Balance Overall balance assessment: Needs assistance Sitting-balance support: Feet supported;No upper extremity supported Sitting balance-Leahy Scale: Good Sitting balance - Comments: maintains static sitting balance with improved postural control, close sup this date   Standing balance support: Bilateral upper extremity supported Standing balance-Leahy Scale: Fair Standing balance comment: min/mod assist for postural extension with manual cuing at posterior hips, upper chest/sternum for truncal extension                            Cognition Arousal/Alertness: Awake/alert Behavior During Therapy: Impulsive                                   General Comments: mildly confused with limited insight into deficits and overall health condition      Exercises Other Exercises Other Exercises: Sit/stand x3 with RW, min/mod assist +2; progressed to include  R UE reaching (modified D2 extension pattern) to promote UE strength and overall postural extension.  Marked improvement in postural control with integratino of functional activity-able to initiate and correct to neutral; unable to maintain >5-10 seconds. Other Exercises: Continued  LE assessment reveals multi-beat, sustained clonus to R ankle, positive babinski bilat LEs.    General Comments        Pertinent Vitals/Pain Pain Assessment: Faces Faces Pain Scale: Hurts even more Pain Location: R shoulder, back Pain Descriptors / Indicators: Grimacing Pain Intervention(s): Limited activity within patient's tolerance;Monitored during session;Repositioned    Home Living                      Prior Function            PT Goals (current goals can now be found in the care plan section) Acute Rehab PT Goals Patient Stated Goal: get better PT Goal Formulation: With patient Time For Goal Achievement: 11/15/16 Potential to Achieve Goals: Good Progress towards PT goals: Progressing toward goals    Frequency    7X/week      PT Plan Current plan remains appropriate    Co-evaluation              AM-PAC PT "6 Clicks" Daily Activity  Outcome Measure  Difficulty turning over in bed (including adjusting bedclothes, sheets and blankets)?: Total Difficulty moving from lying on back to sitting on the side of the bed? : Total Difficulty sitting down on and standing up from a chair with arms (e.g., wheelchair, bedside commode, etc,.)?: Total Help needed moving to and from a bed to chair (including a wheelchair)?: Total Help needed walking in hospital room?: Total Help needed climbing 3-5 steps with a railing? : Total 6 Click Score: 6    End of Session Equipment Utilized During Treatment: Oxygen Activity Tolerance: Patient tolerated treatment well Patient left: in chair;with call bell/phone within reach (nursing aware of patient position) Nurse Communication: Mobility status PT Visit Diagnosis: Muscle weakness (generalized) (M62.81);Difficulty in walking, not elsewhere classified (R26.2)     Time: [id number] PT Time Calculation (min) (ACUTE ONLY): 38 min  Charges:  $Gait Training: 8-22 mins $Therapeutic Activity: 23-37 mins                     G Codes:       Collen Vincent H. Manson PasseyBrown, PT, DPT, NCS 11/03/16, 4:49 PM [phone]

## 2016-11-03 NOTE — Progress Notes (Signed)
PULMONARY / CRITICAL CARE MEDICINE   Name: Kevin Holden MRN: [id number] DOB: 1950-07-30    ADMISSION DATE:  10/15/2016    PT PROFILE:   66 y.o. male smoker intubated after failing BiPAP with diagnosis of AECOPD  MAJOR EVENTS/TEST RESULTS: 05/18 Admitted by Hospitalist service with acute on chronic respiratory failure deemed due to AECOPD 05/18 Echo:The estimated ejection   fraction was in the range of 55% to 60%. Wall motion was normal. Doppler parameters are con sistent with abnormal left ventricular relaxation (grade 1 diastolic dysfunction).  05/19 Transferred to ICU/SDU for BiPAP. Failed BiPAP and intubated 05/20 Renal US: No cause for acute renal failure identified. Both kidneys are relatively small, measuring less than 9 cm  05/21 very agitated on WUA. No weaning 05/22 Very agitated on WUA. Dexmedetomidine initiated 05/23 increased airflow obstruction. Precedex changed to propofol. Continued problems with agitation on WUA 05/24 persistent severe airflow obstruction. PSV as tolerated. 05/25 family meeting held with pts 3 daughters to discuss goals of care pts daughters stated they would like to proceed with tracheostomy and peg tube placement if indicated. They also wanted to change pts code status to limited code to include NO CPR, ACLS medications, or defibrillation and/or cardioversion 05/28 Met with family, discussed status and plan, continue to want trach.  05/29 Eldest daughter continues to want trach, confirms DNR status. ; failed weaning trial.  05/30 Failed weaning trial due to tachypnea and tachycardia within 15 minutes.  05/31 tracheostomy tube placed 06/01 PEG tube placed 06/02 failed weaning.  06/04 on trach collar all day 06/05 comfortable on trach collar. Copious purulent tracheal secretions 06/06 remains comfortable on trach collar. Remains mildly confused. Still with significant tracheal secretions.  INDWELLING DEVICES:: ETT 05/19 >> 05/31 L IJ CVL 05/19 >>  06/04 Trach tube 05/31 >>   MICRO DATA: MRSA PCR 05/19 >> NEG Urine 05/20 >> NEG Resp 06/05 >> abundant GPC's in pairs, moderate GNR's, few GPRs >>   ANTIMICROBIALS:  Levofloxacin 05/19 >> 05/22 Ceftriaxone 05/22 >>05/25 Levofloxacin 06/05 >>   SUBJ: Remains comfortable on trach collar. Remains mildly confused. Still with significant tracheal secretions.  VITAL SIGNS: BP 98/66 (BP Location: Right Arm)   Pulse (!) 105   Temp 98.5 F (36.9 C) (Axillary)   Resp (!) 26   Ht 6' (1.829 m)   Wt 115 lb 1.3 oz (52.2 kg)   SpO2 95%   BMI 15.61 kg/m   HEMODYNAMICS:    VENTILATOR SETTINGS: FiO2 (%):  [35 %-40 %] 40 %  INTAKE / OUTPUT: I/O last 3 completed shifts: In: 9030 [I.V.:1200; Other:162; NG/GT:1885] Out: 1050 [Urine:1050]  PHYSICAL EXAMINATION: Gen: No distress on ATC, RASS 0 Neuro: No focal deficits, diffusely weak HEENT: supple, no JVD present  Lungs: No wheezes, few rhonchi Cardiovascular: Regular, no murmurs Abdomen: soft, +BS x4, soft, non tender, non distended  Extremities: Warm, no edema Skin: intact no rashes or lesions  LABS:  BMET  Recent Labs Lab 10/31/16 1118 11/01/16 0520 11/03/16 0446  NA 155* 154* 148*  K 3.4* 3.8 3.6  CL 114* 116* 108  CO2 31 31 30   BUN 29* 25* 22*  CREATININE 0.86 0.86 0.76  GLUCOSE 150* 149* 145*    Electrolytes  Recent Labs Lab 10/29/16 0550 10/30/16 0458 10/31/16 1118 11/01/16 0520 11/03/16 0446  CALCIUM 8.3* 8.4* 8.9 9.1 9.2  MG 2.2 2.2 2.4  --   --   PHOS  --   --  2.8  --   --  CBC  Recent Labs Lab 10/30/16 0458 10/31/16 1118 11/01/16 0520  WBC 17.2* 15.6* 17.2*  HGB 11.1* 10.8* 12.1*  HCT 33.6* 32.9* 37.0*  PLT 239 265 258    ABG No results for input(s): PHART, PCO2ART, PO2ART in the last 168 hours.    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 Prolonged mechanical ventilation - off  vent X 48 hrs Purulent tracheobronchitis  Hypertension, marginally controlled Hypernatremia G tube status protein-calorie malnutrition  DM 2 with mild hyperglycemia Steroid induced hyperglycemia Profound deconditioning   Continue nebulized bronchodilators Continue supplemental oxygen Decrease prednisone to 10 mg daily Respiratory culture ordered 06/05 Empiric levofloxacin initiated 06/05 Diltiazem initiated 06/05 Continue free water repletion Continue physical therapy OT ordered 06/05 Continue to monitor CBGs intermittently - consider SSI for CBGs greater than 180 Discharge planning-he will require some sort of nursing facility that can provide physical rehabilitation His nursing requirements continue to exceed what can be provided on a medical floor  Merton Border, MD PCCM service Mobile [phone] Pager [phone] 11/03/2016 1:11 PM

## 2016-11-04 LAB — GLUCOSE, CAPILLARY
Glucose-Capillary: 130 mg/dL — ABNORMAL HIGH (ref 65–99)
Glucose-Capillary: 132 mg/dL — ABNORMAL HIGH (ref 65–99)
Glucose-Capillary: 148 mg/dL — ABNORMAL HIGH (ref 65–99)

## 2016-11-04 LAB — CULTURE, RESPIRATORY W GRAM STAIN

## 2016-11-04 LAB — CULTURE, RESPIRATORY: CULTURE: NORMAL

## 2016-11-04 NOTE — Progress Notes (Signed)
Patient alert and able to make needs known. Communicates well with nods, gestures and mouthing words. Patient is impatient and wants to go home. Patient needs much redirection and reminded that he must keep the oxygen on his trach collar. He is able to cough up through the trach a copious amount of thick, greenish colored mucous. Tolerates suctioning fairly well when needed. Refused repositioning a few times. Upon start of shift pain was rated a 10/10, generalized and a new order was received from the NP for pain medicine, which was effective for relief. Bed alarm remains on as the patient attempts to get up many times. Call bell in reach.

## 2016-11-04 NOTE — Progress Notes (Signed)
Occupational Therapy Treatment Patient Details Name: Kevin Holden MRN: [id number] DOB: 11-05-1950 Today's Date: 11/04/2016    History of present illness 66 yo male pt presented to ER secondary to progressive SOB; admitted with acute/chronic respiratory failure with hypoxemia secondary to asthma/COPD.  Hospital course significant for intubation (5/19) with failure to extubate (complicated by intermittent agitation), status post trach placement 5/31 with transition to trach collar 6/3.  PEG placed 6/1.   OT comments  Pt seen for OT treatment session after patient care with nursing staff. Pt eager to work with OT, very pleased with his progress earlier this date working with PT. Pt performed BUE strengthening exercises to support functional independence with self care tasks and functional mobility. Pt's strength improving well since date of evaluation. Good grip strength. Pt reported 8/10 L hip pain, participated in bed mobility to support readjustment of pillows for comfort/pain relief. Pt reported some relief but still in significant pain. RN notified of pt's request for pain medications. Continue to progress towards OT goals.    Follow Up Recommendations  CIR    Equipment Recommendations       Recommendations for Other Services      Precautions / Restrictions Precautions Precautions: Fall Precaution Comments: NPO-trach/PEG Restrictions Weight Bearing Restrictions: No       Mobility Bed Mobility Overal bed mobility: Needs Assistance Bed Mobility: Rolling Rolling: Min guard         General bed mobility comments: min guard and verbal cues to roll towards R side in order for OT to better position a pillow for comfort/pain relief  Transfers                      Balance                                           ADL either performed or assessed with clinical judgement   ADL Overall ADL's : Needs assistance/impaired                                              Vision Patient Visual Report: No change from baseline Vision Assessment?: No apparent visual deficits   Perception     Praxis      Cognition Arousal/Alertness: Awake/alert Behavior During Therapy: WFL for tasks assessed/performed Overall Cognitive Status: Within Functional Limits for tasks assessed                                          Exercises General Exercises - Upper Extremity Shoulder Flexion: AROM;Strengthening;Both;10 reps;Supine;Other (comment) (minimal resistance) Elbow Flexion: AROM;Strengthening;Both;10 reps;Supine;Other (comment) (minimal resistance) Elbow Extension: AROM;Strengthening;Both;10 reps;Other (comment) (moderate resistance) Other Exercises Other Exercises: pt educated in pursed lip breathing to support breathing/minimize SOB/pain mgt strategy, pt able to demonstrate with verbal cues, would benefit from additional education/training to maximize recall and carryover of learned techniques   Shoulder Instructions       General Comments      Pertinent Vitals/ Pain       Pain Assessment: 0-10 Pain Score: 8  Pain Location: L posterior hip Pain Intervention(s): Patient requesting pain meds-RN notified;Repositioned;Monitored during session;Limited activity within patient's tolerance;Utilized relaxation  techniques  Home Living                                          Prior Functioning/Environment              Frequency  Min 3X/week        Progress Toward Goals  OT Goals(current goals can now be found in the care plan section)  Progress towards OT goals: Progressing toward goals  Acute Rehab OT Goals Patient Stated Goal: get better OT Goal Formulation: With patient Time For Goal Achievement: 11/16/16 Potential to Achieve Goals: Good  Plan Discharge plan remains appropriate;Frequency remains appropriate    Co-evaluation                 AM-PAC PT "6 Clicks" Daily  Activity     Outcome Measure   Help from another person eating meals?: Total (NPO, PEG tube) Help from another person taking care of personal grooming?: A Little Help from another person toileting, which includes using toliet, bedpan, or urinal?: A Lot Help from another person bathing (including washing, rinsing, drying)?: A Lot Help from another person to put on and taking off regular upper body clothing?: A Little Help from another person to put on and taking off regular lower body clothing?: A Lot 6 Click Score: 13    End of Session    OT Visit Diagnosis: Other abnormalities of gait and mobility (R26.89);Muscle weakness (generalized) (M62.81)   Activity Tolerance Patient limited by pain   Patient Left in bed;with call bell/phone within reach;with bed alarm set   Nurse Communication Patient requests pain meds        Time: [id number] OT Time Calculation (min): 15 min  Charges: OT General Charges $OT Visit: 1 Procedure OT Treatments $Therapeutic Exercise: 8-22 mins  Richrd PrimeJamie Stiller, MPH, MS, OTR/L ascom [phone] 11/04/16, 4:05 PM

## 2016-11-04 NOTE — Progress Notes (Signed)
[facility] - [place] at [facility]   PATIENT NAME: Kevin Holden    MR#:  [id number]  DATE OF BIRTH:  [birth year]  SUBJECTIVE:  Awake.    Had trach 10/28/16 and PEG 10/29/16.  Off vent. TFs  Nods yes and no.  REVIEW OF SYSTEMS:   Review of Systems  Unable to perform ROS: Intubated    DRUG ALLERGIES:   Allergies  Allergen Reactions  . Aspirin Other (See Comments)    Unknown  . Celecoxib Other (See Comments)    Reaction: Unknown  . Nsaids Other (See Comments)    Reaction: Unknown    VITALS:  Blood pressure 122/75, pulse 86, temperature 98.5 F (36.9 C), temperature source Oral, resp. rate 17, height 6' (1.829 m), weight 52.2 kg (115 lb 1.3 oz), SpO2 93 %.  PHYSICAL EXAMINATION:   Physical Exam  GENERAL:  66 y.o.-year-old patient lying in the bed  EYES: Pupils equal, round, reactive to light and accommodation. No scleral icterus. Extraocular muscles intact.  HEENT: Head atraumatic, normocephalic. Oropharynx and nasopharynx clear. Tracheostomy tube in place NECK:  Supple, no jugular venous distention. No thyroid enlargement, no tenderness.  LUNGS: Normal breath sounds bilaterally, no wheezing, rales, rhonchi. No use of accessory muscles of respiration.  CARDIOVASCULAR: S1, S2 normal. No murmurs, rubs, or gallops.  ABDOMEN: Soft, nontender, nondistended. Bowel sounds present. No organomegaly or mass. PEG tube in place. EXTREMITIES: No cyanosis, clubbing or edema b/l.    NEUROLOGIC: alert and awake SKIN: No obvious rash, lesion, or ulcer.   LABORATORY PANEL:  CBC  Recent Labs Lab 11/01/16 0520  WBC 17.2*  HGB 12.1*  HCT 37.0*  PLT 258    Chemistries   Recent Labs Lab 10/31/16 1118  11/03/16 0446  NA 155*  < > 148*  K 3.4*  < > 3.6  CL 114*  < > 108  CO2 31  < > 30  GLUCOSE 150*  < > 145*  BUN 29*  < > 22*  CREATININE 0.86  < > 0.76  CALCIUM 8.9  < > 9.2  MG 2.4  --   --   < > = values in this interval not  displayed. Cardiac Enzymes No results for input(s): TROPONINI in the last 168 hours. RADIOLOGY:  No results found. ASSESSMENT AND PLAN:   [name] is a 66 y.o. black male Asthma/COPD, hypertension, prior history of pneumonia, who [facility]for evaluation of increasing shortness of breath. COPD exacerbation   # Acute on chronic respiratory failure with hypoxia due to COPD exacerbation and pneumonia Nebs   Trach placed 10/28/16. Trach colar. Tracheostomy Cx with GPC in pairs and GNR.Thought to be normal respiratory flora. On levaquin  # Septic shock secondary to pneumonia Off pressors. Resolved  # Hypernatremia. Started on free water through PEG tube. Resolved   # COPD exacerbation due to acute bronchitis Pulmicort, DuoNeb's, prednisone, Spiriva, follow clinically   # Acute kidney injury due to ATN from hypotension Resolved  # Chronic Tobacco abuse  CODE STATUS: FULL  DVT Prophylaxis: Lovenox  TOTAL TIME TAKING CARE OF THIS PATIENT: 20 minutes.   Note: This dictation was prepared with Dragon dictation along with smaller phrase technology. Any transcriptional errors that result from this process are unintentional.  Milagros LollSudini, Armenta Erskin R M.D on 11/04/2016 at 11:28 AM  Between 7am to 6pm - Pager - [phone]  After 6pm go to [email or web address] - password [email or web address]  [facility]  Office  [phone]  CC: Primary care physician; Leanna Sato, MD

## 2016-11-04 NOTE — Progress Notes (Signed)
PULMONARY / CRITICAL CARE MEDICINE   Name: Kevin Holden MRN: [id number] DOB: 16-Sep-1950    ADMISSION DATE:  10/15/2016    PT PROFILE:   66 y.o. male smoker intubated after failing BiPAP with diagnosis of AECOPD  MAJOR EVENTS/TEST RESULTS: 05/18 Admitted by Hospitalist service with acute on chronic respiratory failure deemed due to AECOPD 05/18 Echo:The estimated ejection   fraction was in the range of 55% to 60%. Wall motion was normal. Doppler parameters are con sistent with abnormal left ventricular relaxation (grade 1 diastolic dysfunction).  05/19 Transferred to ICU/SDU for BiPAP. Failed BiPAP and intubated 05/20 Renal US: No cause for acute renal failure identified. Both kidneys are relatively small, measuring less than 9 cm  05/21 very agitated on WUA. No weaning 05/22 Very agitated on WUA. Dexmedetomidine initiated 05/23 increased airflow obstruction. Precedex changed to propofol. Continued problems with agitation on WUA 05/24 persistent severe airflow obstruction. PSV as tolerated. 05/25 family meeting held with pts 3 daughters to discuss goals of care pts daughters stated they would like to proceed with tracheostomy and peg tube placement if indicated. They also wanted to change pts code status to limited code to include NO CPR, ACLS medications, or defibrillation and/or cardioversion 05/28 Met with family, discussed status and plan, continue to want trach.  05/29 Eldest daughter continues to want trach, confirms DNR status. ; failed weaning trial.  05/30 Failed weaning trial due to tachypnea and tachycardia within 15 minutes.  05/31 tracheostomy tube placed 06/01 PEG tube placed 06/02 failed weaning.  06/04 on trach collar all day 06/05 comfortable on trach collar. Copious purulent tracheal secretions 06/06 remains comfortable on trach collar. Remains mildly confused. Still with significant tracheal secretions   INDWELLING DEVICES:: ETT 05/19 >> 05/31 L IJ CVL 05/19 >>  06/04 Trach tube 05/31 >>   MICRO DATA: MRSA PCR 05/19 >> NEG Urine 05/20 >> NEG Resp 06/05 >> abundant GPC's in pairs, moderate GNR's, few GPRs >>   ANTIMICROBIALS:  Levofloxacin 05/19 >> 05/22 Ceftriaxone 05/22 >>05/25 Levofloxacin 06/05 >>   SUBJ: According to RN pt fell last night attempting to get out of bed without assistance he mouths he wants to go home.  He needs continued redirection  VITAL SIGNS: BP 121/71   Pulse 86   Temp 98.5 F (36.9 C) (Oral)   Resp 20   Ht 6' (1.829 m)   Wt 52.2 kg (115 lb 1.3 oz)   SpO2 92%   BMI 15.61 kg/m   HEMODYNAMICS:    VENTILATOR SETTINGS: FiO2 (%):  [35 %-40 %] 40 %  INTAKE / OUTPUT: I/O last 3 completed shifts: In: 2316 [I.V.:6; Other:425; NG/GT:1885] Out: 1 [Urine:1]  PHYSICAL EXAMINATION: Gen: No distress on ATC, RASS 0 Neuro: No focal deficits, diffusely weak, follows commands  HEENT: supple, no JVD present, midline tracheostomy  Lungs: rhonchi throughout, even, non labored on trach collar  Cardiovascular: nsr, regular, no M/R/G Abdomen: soft, +BS x4, soft, non tender, non distended  Extremities: Warm, no edema Skin: intact no rashes or lesions  LABS:  BMET  Recent Labs Lab 10/31/16 1118 11/01/16 0520 11/03/16 0446  NA 155* 154* 148*  K 3.4* 3.8 3.6  CL 114* 116* 108  CO2 _0 BUN 29* 25* 22*  CREATININE 0.86 0.86 0.76  GLUCOSE 150* 149* 145*    Electrolytes  Recent Labs Lab 10/29/16 0550 10/30/16 0458 10/31/16 1118 11/01/16 0520 11/03/16 0446  CALCIUM 8.3* 8.4* 8.9 9.1 9.2  MG 2.2 2.2 2.4  --   --  PHOS  --   --  2.8  --   --     CBC  Recent Labs Lab 10/30/16 0458 10/31/16 1118 11/01/16 0520  WBC 17.2* 15.6* 17.2*  HGB 11.1* 10.8* 12.1*  HCT 33.6* 32.9* 37.0*  PLT 239 265 258    ABG No results for input(s): PHART, PCO2ART, PO2ART in the last 168 hours.    CXR: NNF  ASSESSMENT: Acute on chronic hypercarbic respiratory failure End stage COPD/emphysema AECOPD -  resolving Tracheostomy status Prolonged mechanical ventilation - off vent >48 hrs Purulent tracheobronchitis  Hypertension, marginally controlled Hypernatremia G tube status Protein-calorie malnutrition  DM 2 with mild hyperglycemia Steroid induced hyperglycemia Profound deconditioning  P: Continue nebulized bronchodilators Continue supplemental oxygen Continue po prednisone  Follow respiratory culture  Continue empiric levofloxacin initiated 06/05 Continue diltiazem initiated 06/05 Continue tube feeds and free water repletion Continue physical therapy and OT  Attempt OOB to Chair today 06/7 Continue to monitor CBGs intermittently - consider SSI for CBGs greater than 180 Discharge planning-he will require some sort of nursing facility that can provide physical rehabilitation His nursing requirements continue to exceed what can be provided on a medical floor  [name], [place] Pager [phone] (please enter 7 digits) PCCM Consult Pager [phone] (please enter 7 digits)  PCCM ATTENDING ATTESTATION:  I have evaluated patient with the APP [place], reviewed database in its entirety and discussed care plan in detail. In addition, this patient was discussed on multidisciplinary rounds.   He continues to make gradual progress. However, his nursing and RT needs exceed what can be safely provided on a medical surgical floor. We'll continue to watch him in the SDU  Merton Border, MD PCCM service Mobile [phone] Pager [phone] 11/04/2016 12:13 PM

## 2016-11-04 NOTE — Progress Notes (Signed)
Inpatient Rehabilitation  Continuing to follow along for patient progress and disposition planning.  Note that team is seeking LTAC authorization today.  I have [facility] and [facility] Landngela to notify us if plans should change.  Please call with questions.   Charlane FerrettiMelissa Kinleigh Nault, M.A., CCC/SLP Admission Coordinator  [facility]  Cell [phone]

## 2016-11-04 NOTE — Progress Notes (Signed)
Physical Therapy Treatment Patient Details Name: Kevin BurrowJoseph Keech MRN: [id number] DOB: Jul 07, 1950 Today's Date: 11/04/2016    History of Present Illness 66 yo male pt presented to ER secondary to progressive SOB; admitted with acute/chronic respiratory failure with hypoxemia secondary to asthma/COPD.  Hospital course significant for intubation (5/19) with failure to extubate (complicated by intermittent agitation), status post trach placement 5/31 with transition to trach collar 6/3.  PEG placed 6/1.    PT Comments    Pt ready and motivated to participate in session this am.  Participated in exercises as described below.  Pt with improved bed mobility skills this session but does require extra time to complete repositioning.  To edge of bed and was able to stand with min/mod a x 2.  He was able to take 4 steps to recliner at bedside where he remained at end of session with chair alarm intact.  Pt re-educated on use of call bell and to not attempt standing without assist.  Call bell in reach.  He nodded understanding and instructed to call nsg when he wanted to return to bed.  He was fatigued with tasks and declined further session.   Follow Up Recommendations  CIR     Equipment Recommendations  Rolling walker with 5" wheels    Recommendations for Other Services       Precautions / Restrictions Precautions Precautions: Fall Precaution Comments: NPO-trach/PEG Restrictions Weight Bearing Restrictions: No    Mobility  Bed Mobility Overal bed mobility: Needs Assistance Bed Mobility: Supine to Sit     Supine to sit: Mod assist        Transfers Overall transfer level: Needs assistance Equipment used: Rolling walker (2 wheeled) Transfers: Sit to/from Stand Sit to Stand: Min assist;Mod assist;+2 physical assistance            Ambulation/Gait     Assistive device: Rolling walker (2 wheeled) Gait Pattern/deviations: Step-to pattern   Gait velocity interpretation: <1.8  ft/sec, indicative of risk for recurrent falls General Gait Details: 4 steps to chair at bedside, improved stepping overall with improved support and control.   Stairs            Wheelchair Mobility    Modified Rankin (Stroke Patients Only)       Balance Overall balance assessment: Needs assistance Sitting-balance support: Feet supported;No upper extremity supported Sitting balance-Leahy Scale: Good Sitting balance - Comments: maintains static sitting balance with improved postural control, close sup this date   Standing balance support: Bilateral upper extremity supported Standing balance-Leahy Scale: Fair                              Cognition Arousal/Alertness: Awake/alert Behavior During Therapy: WFL for tasks assessed/performed Overall Cognitive Status: Within Functional Limits for tasks assessed                                        Exercises Other Exercises Other Exercises: supine x 2 x 10 for ankle pumps, heel slides, ab/add, slr     General Comments        Pertinent Vitals/Pain Pain Assessment: Faces Faces Pain Scale: Hurts even more Pain Location: R shoulder, back, bottom Pain Descriptors / Indicators: Grimacing Pain Intervention(s): Limited activity within patient's tolerance    Home Living  Prior Function            PT Goals (current goals can now be found in the care plan section) Progress towards PT goals: Progressing toward goals    Frequency    7X/week      PT Plan Current plan remains appropriate    Co-evaluation              AM-PAC PT "6 Clicks" Daily Activity  Outcome Measure  Difficulty turning over in bed (including adjusting bedclothes, sheets and blankets)?: Total Difficulty moving from lying on back to sitting on the side of the bed? : Total Difficulty sitting down on and standing up from a chair with arms (e.g., wheelchair, bedside commode, etc,.)?:  Total Help needed moving to and from a bed to chair (including a wheelchair)?: Total Help needed walking in hospital room?: Total Help needed climbing 3-5 steps with a railing? : Total 6 Click Score: 6    End of Session Equipment Utilized During Treatment: Oxygen Activity Tolerance: Patient tolerated treatment well Patient left: in chair;with call bell/phone within reach Nurse Communication: Mobility status       Time: 1040-1103 PT Time Calculation (min) (ACUTE ONLY): 23 min  Charges:  $Therapeutic Exercise: 8-22 mins $Therapeutic Activity: 8-22 mins                    G Codes:       Danielle Dess, PTA 11/04/16, 11:45 AM

## 2016-11-05 LAB — BASIC METABOLIC PANEL
Anion gap: 8 (ref 5–15)
BUN: 21 mg/dL — ABNORMAL HIGH (ref 6–20)
CALCIUM: 8.7 mg/dL — AB (ref 8.9–10.3)
CO2: 30 mmol/L (ref 22–32)
CREATININE: 0.83 mg/dL (ref 0.61–1.24)
Chloride: 107 mmol/L (ref 101–111)
GFR calc non Af Amer: >60 mL/min (ref >60)
Glucose, Bld: 146 mg/dL — ABNORMAL HIGH (ref 65–99)
Potassium: 3.7 mmol/L (ref 3.5–5.1)
SODIUM: 145 mmol/L (ref 135–145)

## 2016-11-05 LAB — GLUCOSE, CAPILLARY
Glucose-Capillary: 116 mg/dL — ABNORMAL HIGH (ref 65–99)
Glucose-Capillary: 137 mg/dL — ABNORMAL HIGH (ref 65–99)
Glucose-Capillary: 137 mg/dL — ABNORMAL HIGH (ref 65–99)

## 2016-11-05 LAB — CBC
HCT: 32.7 % — ABNORMAL LOW (ref 40.0–52.0)
Hemoglobin: 10.8 g/dL — ABNORMAL LOW (ref 13.0–18.0)
MCH: 27.7 pg (ref 26.0–34.0)
MCHC: 33.1 g/dL (ref 32.0–36.0)
MCV: 83.8 fL (ref 80.0–100.0)
PLATELETS: 229 10*3/uL (ref 150–440)
RBC: 3.9 MIL/uL — AB (ref 4.40–5.90)
RDW: 15.5 % — AB (ref 11.5–14.5)
WBC: 8.2 10*3/uL (ref 3.8–10.6)

## 2016-11-05 NOTE — Progress Notes (Addendum)
Pt.'s daughter, Kevin Holden bedside, stated she had missed a call from case management.  Was requesting that the best time foir Kevin Holden to call the daughter, Kevin Holden on 6/9 was at work number between 8-12p, then she will be available at home/cell #.

## 2016-11-05 NOTE — Care Management (Signed)
LTAC auth still pending- per Loury with [facility]. I have updated MD and unit clerk that patient may discharge to LTAC or need another Peer to Peer review. I attempted to contact patient's daughter [name] by phone but there was no answer at either number.

## 2016-11-05 NOTE — Progress Notes (Signed)
PT Cancellation Note  Patient Details Name: Kevin BurrowJoseph Holden MRN: [id number] DOB: 27-Oct-1950   Cancelled Treatment:    Reason Eval/Treat Not Completed: Pain limiting ability to participate (Treatment session attempted.  Patient politely declined this date due to pain in L hip; preferred to rest at this time.  RN informed/aware of request for pain meds.  Will re-attempt at later time/date as appropriate.  Of note, care team pursuing transition to LTAC; will modify treatment frequency to 2x/week at this time, but continue to see as frequently as possible to maximize functional recovery.)   Tiffony Kite H. Manson PasseyBrown, PT, DPT, NCS 11/05/16, 3:24 PM [phone]

## 2016-11-05 NOTE — Progress Notes (Signed)
[facility] [place] [facility]   PATIENT NAME: Kevin BurrowJoseph Holden    MR#:  [id number]  DATE OF BIRTH:  1950-09-13  SUBJECTIVE:  Awake.    Had trach 10/28/16 and PEG 10/29/16.  Off vent. TFs  Nods yes and no. Smiling  REVIEW OF SYSTEMS:   Review of Systems  Unable to perform ROS: Intubated    DRUG ALLERGIES:   Allergies  Allergen Reactions  . Aspirin Other (See Comments)    Unknown  . Celecoxib Other (See Comments)    Reaction: Unknown  . Nsaids Other (See Comments)    Reaction: Unknown    VITALS:  Blood pressure 111/64, pulse 92, temperature 98 F (36.7 C), temperature source Axillary, resp. rate 17, height 6' (1.829 m), weight 52.2 kg (115 lb 1.3 oz), SpO2 98 %.  PHYSICAL EXAMINATION:   Physical Exam  GENERAL:  66 y.o.-year-old patient lying in the bed  EYES: Pupils equal, round, reactive to light and accommodation. No scleral icterus. Extraocular muscles intact.  HEENT: Head atraumatic, normocephalic. Oropharynx and nasopharynx clear. Tracheostomy tube in place NECK:  Supple, no jugular venous distention. No thyroid enlargement, no tenderness.  LUNGS: Normal breath sounds bilaterally, no wheezing, rales, rhonchi. No use of accessory muscles of respiration.  CARDIOVASCULAR: S1, S2 normal. No murmurs, rubs, or gallops.  ABDOMEN: Soft, nontender, nondistended. Bowel sounds present. No organomegaly or mass. PEG tube in place. EXTREMITIES: No cyanosis, clubbing or edema b/l.    NEUROLOGIC: alert and awake SKIN: No obvious rash, lesion, or ulcer.   LABORATORY PANEL:  CBC  Recent Labs Lab 11/05/16 0439  WBC 8.2  HGB 10.8*  HCT 32.7*  PLT 229    Chemistries   Recent Labs Lab 10/31/16 1118  11/05/16 0439  NA 155*  < > 145  K 3.4*  < > 3.7  CL 114*  < > 107  CO2 31  < > 30  GLUCOSE 150*  < > 146*  BUN 29*  < > 21*  CREATININE 0.86  < > 0.83  CALCIUM 8.9  < > 8.7*  MG 2.4  --   --   < > = values in this interval not  displayed. Cardiac Enzymes No results for input(s): TROPONINI in the last 168 hours. RADIOLOGY:  No results found. ASSESSMENT AND PLAN:   Mr. Kevin BurrowJoseph Holden is a 66 y.o. black male Asthma/COPD, hypertension, prior history of pneumonia, who [facility] evaluation of increasing shortness of breath. COPD exacerbation   # Acute on chronic respiratory failure with hypoxia due to COPD exacerbation and pneumonia Nebs   Trach placed 10/28/16. Trach colar.  # Purulent tracheobronchitis  levaquin  # Septic shock secondary to pneumonia Off pressors. Resolved  # Hypernatremia. Started on free water through PEG tube. Resolved   # COPD exacerbation due to acute bronchitis Pulmicort, DuoNeb's, prednisone, Spiriva, follow clinically   # Acute kidney injury due to ATN from hypotension Resolved  # Chronic Tobacco abuse  CODE STATUS: Partial  DVT Prophylaxis: Lovenox  TOTAL TIME TAKING CARE OF THIS PATIENT: 20 minutes.  Note: This dictation was prepared with Dragon dictation along with smaller phrase technology. Any transcriptional errors that result from this process are unintentional.  Milagros LollSudini, Admir Candelas R M.D on 11/05/2016 at 12:26 PM  Between 7am to 6pm - Pager - [phone]  After 6pm go to [email or web address] - Social research officer, governmentpassword [email or web address]  [facility]  Office  [phone]  CC: Primary care physician; Darreld McleanMiles, Linda  Jerilynn Mages, MD

## 2016-11-05 NOTE — Progress Notes (Signed)
PULMONARY / CRITICAL CARE MEDICINE   Name: Kevin Holden MRN: [id number] DOB: Dec 04, 1950    ADMISSION DATE:  10/15/2016    PT PROFILE:   66 y.o. male smoker intubated after failing BiPAP with diagnosis of AECOPD  MAJOR EVENTS/TEST RESULTS: 05/18 Admitted by Hospitalist service with acute on chronic respiratory failure deemed due to AECOPD 05/18 Echo:The estimated ejection   fraction was in the range of 55% to 60%. Wall motion was normal. Doppler parameters are con sistent with abnormal left ventricular relaxation (grade 1 diastolic dysfunction).  05/19 Transferred to ICU/SDU for BiPAP. Failed BiPAP and intubated 05/20 Renal US: No cause for acute renal failure identified. Both kidneys are relatively small, measuring less than 9 cm  05/21 very agitated on WUA. No weaning 05/22 Very agitated on WUA. Dexmedetomidine initiated 05/23 increased airflow obstruction. Precedex changed to propofol. Continued problems with agitation on WUA 05/24 persistent severe airflow obstruction. PSV as tolerated. 05/25 family meeting held with pts 3 daughters to discuss goals of care pts daughters stated they would like to proceed with tracheostomy and peg tube placement if indicated. They also wanted to change pts code status to limited code to include NO CPR, ACLS medications, or defibrillation and/or cardioversion 05/28 Met with family, discussed status and plan, continue to want trach.  05/29 Eldest daughter continues to want trach, confirms DNR status. ; failed weaning trial.  05/30 Failed weaning trial due to tachypnea and tachycardia within 15 minutes.  05/31 tracheostomy tube placed 06/01 PEG tube placed 06/02 failed weaning.  06/04 on trach collar all day 06/05 comfortable on trach collar. Copious purulent tracheal secretions 06/06 remains comfortable on trach collar. Remains mildly confused. Still with significant tracheal secretions   INDWELLING DEVICES:: ETT 05/19 >> 05/31 L IJ CVL 05/19 >>  06/04 Trach tube 05/31 >>   MICRO DATA: MRSA PCR 05/19 >> NEG Urine 05/20 >> NEG Resp 06/05 >> abundant GPC's in pairs, moderate GNR's, few GPRs >>   ANTIMICROBIALS:  Levofloxacin 05/19 >> 05/22 Ceftriaxone 05/22 >>05/25 Levofloxacin 06/05 >>   SUBJ: Pt appears to be calmer today and in good spirits.  VITAL SIGNS: BP 111/64   Pulse 92   Temp 98 F (36.7 C) (Axillary)   Resp 17   Ht 6' (1.829 m)   Wt 52.2 kg (115 lb 1.3 oz)   SpO2 98%   BMI 15.61 kg/m   HEMODYNAMICS:    VENTILATOR SETTINGS: FiO2 (%):  [40 %] 40 %  INTAKE / OUTPUT: I/O last 3 completed shifts: In: 1856 [I.V.:16; Other:170; NG/GT:1670] Out: -   PHYSICAL EXAMINATION: Gen: No distress on ATC, RASS 0 Neuro: No focal deficits, diffusely weak, follows commands  HEENT: supple, no JVD present, midline tracheostomy  Lungs: rhonchi throughout, even, non labored on trach collar  Cardiovascular: nsr, regular, no M/R/G Abdomen: soft, +BS x4, soft, non tender, non distended  Extremities: Warm, no edema Skin: intact no rashes or lesions  LABS:  BMET  Recent Labs Lab 11/01/16 0520 11/03/16 0446 11/05/16 0439  NA 154* 148* 145  K 3.8 3.6 3.7  CL 116* 108 107  CO2 31 30 30   BUN 25* 22* 21*  CREATININE 0.86 0.76 0.83  GLUCOSE 149* 145* 146*    Electrolytes  Recent Labs Lab 10/30/16 0458 10/31/16 1118 11/01/16 0520 11/03/16 0446 11/05/16 0439  CALCIUM 8.4* 8.9 9.1 9.2 8.7*  MG 2.2 2.4  --   --   --   PHOS  --  2.8  --   --   --  CBC  Recent Labs Lab 10/31/16 1118 11/01/16 0520 11/05/16 0439  WBC 15.6* 17.2* 8.2  HGB 10.8* 12.1* 10.8*  HCT 32.9* 37.0* 32.7*  PLT 265 258 229    ABG No results for input(s): PHART, PCO2ART, PO2ART in the last 168 hours.    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 Prolonged mechanical ventilation-resolved remains on trach collar  Purulent  tracheobronchitis-improving  Hypertension, marginally controlled Hypernatremia G tube status Protein-calorie malnutrition  DM 2 with mild hyperglycemia Steroid induced hyperglycemia Profound deconditioning  P: Continue nebulized bronchodilators Continue supplemental oxygen Continue po prednisone  Follow respiratory culture  Continue empiric levofloxacin initiated 06/05 Continue diltiazem initiated 06/05 Continue tube feeds and free water repletion Continue physical therapy and OT  Attempt OOB to Chair Continue to monitor CBGs intermittently - consider SSI for CBGs greater than 180 Discharge planning in process currently waiting on a bed at [place], [place] Pager [phone] (please enter 7 digits) PCCM Consult Pager [phone] (please enter 7 digits)   PCCM ATTENDING ATTESTATION:  I have evaluated patient with the APP [place], reviewed database in its entirety and discussed care plan in detail. In addition, this patient was discussed on multidisciplinary rounds. His nursing and RT requirements still exceed what can be safely provided on the floor. Therefore he will remain here in the stepdown unit   Merton Border, MD PCCM service Mobile [phone] Pager [phone] 11/05/2016 5:05 PM

## 2016-11-06 ENCOUNTER — Inpatient Hospital Stay: Payer: Medicare Other

## 2016-11-06 LAB — GLUCOSE, CAPILLARY
Glucose-Capillary: 130 mg/dL — ABNORMAL HIGH (ref 65–99)
Glucose-Capillary: 136 mg/dL — ABNORMAL HIGH (ref 65–99)

## 2016-11-06 MED ORDER — FENTANYL CITRATE (PF) 100 MCG/2ML IJ SOLN
50.0000 ug | INTRAMUSCULAR | Status: DC | PRN
  Administered 2016-11-06 – 2016-11-09 (×9): 50 ug via INTRAVENOUS
  Filled 2016-11-06 (×10): qty 2

## 2016-11-06 NOTE — Progress Notes (Signed)
eLink Physician-Brief Progress Note Patient Name: Kevin BurrowJoseph Botts DOB: 08-27-1950 MRN: [id number]   Date of Service  11/06/2016  HPI/Events of Note  Called by nurse for chest pain.  Trach patient with COPD with no change in resp or hemodynamic status.  eICU Interventions  cxr and EKG requested     Intervention Category Intermediate Interventions: Pain - evaluation and management  Henry RusselSMITH, Mikalyn Hermida, P 11/06/2016, 8:22 PM

## 2016-11-06 NOTE — Progress Notes (Signed)
#  8 shiley changed per Dr. Sung AmabileSimonds to #6 shiley.

## 2016-11-06 NOTE — Progress Notes (Signed)
[facility] - [place] [facility]   PATIENT NAME: Kevin Holden    MR#:  [id number]  DATE OF BIRTH:  [birth year]  SUBJECTIVE:  Awake.    Had trach 10/28/16 and PEG 10/29/16. Off vent. TFs Awake. Confused at times  Purulent secretions from trach  REVIEW OF SYSTEMS:   Review of Systems  Unable to perform ROS: Intubated    DRUG ALLERGIES:   Allergies  Allergen Reactions  . Aspirin Other (See Comments)    Unknown  . Celecoxib Other (See Comments)    Reaction: Unknown  . Nsaids Other (See Comments)    Reaction: Unknown    VITALS:  Blood pressure 137/69, pulse 94, temperature 98.7 F (37.1 C), resp. rate (!) 22, height 6' (1.829 m), weight 52.2 kg (115 lb 1.3 oz), SpO2 97 %.  PHYSICAL EXAMINATION:   Physical Exam  GENERAL:  66 y.o.-year-old patient lying in the bed  EYES: Pupils equal, round, reactive to light and accommodation. No scleral icterus. Extraocular muscles intact.  HEENT: Head atraumatic, normocephalic. Oropharynx and nasopharynx clear. Tracheostomy tube in place NECK:  Supple, no jugular venous distention. No thyroid enlargement, no tenderness.  LUNGS: Normal breath sounds bilaterally, no wheezing, rales, rhonchi. No use of accessory muscles of respiration.  CARDIOVASCULAR: S1, S2 normal. No murmurs, rubs, or gallops.  ABDOMEN: Soft, nontender, nondistended. Bowel sounds present. No organomegaly or mass. PEG tube in place. EXTREMITIES: No cyanosis, clubbing or edema b/l.    NEUROLOGIC: alert and awake SKIN: No obvious rash, lesion, or ulcer.   LABORATORY PANEL:  CBC  Recent Labs Lab 11/05/16 0439  WBC 8.2  HGB 10.8*  HCT 32.7*  PLT 229    Chemistries   Recent Labs Lab 10/31/16 1118  11/05/16 0439  NA 155*  < > 145  K 3.4*  < > 3.7  CL 114*  < > 107  CO2 31  < > 30  GLUCOSE 150*  < > 146*  BUN 29*  < > 21*  CREATININE 0.86  < > 0.83  CALCIUM 8.9  < > 8.7*  MG 2.4  --   --   < > = values in this interval not  displayed. Cardiac Enzymes No results for input(s): TROPONINI in the last 168 hours. RADIOLOGY:  No results found. ASSESSMENT AND PLAN:   [name] is a 66 y.o. black male Asthma/COPD, hypertension, prior history of pneumonia, who [facility]for evaluation of increasing shortness of breath. COPD exacerbation   # Acute on chronic respiratory failure with hypoxia due to COPD exacerbation and pneumonia # Septic shock secondary to pneumonia - resolved # tarcheobronchitis # Hypernatremia.Resolved  # COPD exacerbation due to acute bronchitis # Acute kidney injury due to ATN from hypotension # Chronic Tobacco abuse  Tracheotomy downsized On levaquin Waiting for LTAC Pulmonary on board  CODE STATUS: Partial  DVT Prophylaxis: Lovenox  TOTAL TIME TAKING CARE OF THIS PATIENT: 20 minutes.   Milagros LollSudini, Brandi Tomlinson R M.D on 11/06/2016 at 1:16 PM  Between 7am to 6pm - Pager - [phone]  After 6pm go to [email or web address] - password [email or web address]  [facility] [place]  Office  [phone]  CC: Primary care physician; Leanna SatoMiles, Linda M, MD    Note: This dictation was prepared with Dragon dictation along with smaller phrase technology. Any transcriptional errors that result from this process are unintentional.

## 2016-11-06 NOTE — Progress Notes (Signed)
Good day. Slept off and on. Refused to turn off of left side earlier in day but he was off his sacrum. After afternoon bath up in chair watching TV. Refused to be suction-secretions wiped from chest. Strong cough and able to clear own secretions.

## 2016-11-06 NOTE — Progress Notes (Signed)
Pt. VSS O/N, Pt. OOB to chair with a 2 person assist- remained in chair for 1 hour without complication. Copious secretions oozing from around trach site/mouth/nose. Placed external catheter to assist with keeping urine off of sacral wound. Wound care done at 2200, TC done @ 0400. Pt. Resting in bed. Report given to Myra.

## 2016-11-06 NOTE — Progress Notes (Signed)
Pt.'s wound under right side of trach has a golf size area of edema that is reddened and painful.  When changing pt.'s dressing pt.'s wound oozed out a copious amount of pus & blood that had a purulent odor. [facility] who instructed to clean and redress as best as possible. MD will leave a note for the daytime MD to address this with Dr. Andee PolesVaught from ENT. Will continue to monitor pt. Closely.

## 2016-11-06 NOTE — Progress Notes (Signed)
PULMONARY / CRITICAL CARE MEDICINE   Name: Kevin Holden MRN: [id number] DOB: December 02, 1950    ADMISSION DATE:  10/15/2016    PT PROFILE:   66 y.o. male smoker intubated after failing BiPAP with diagnosis of AECOPD  MAJOR EVENTS/TEST RESULTS: 05/18 Admitted by Hospitalist service with acute on chronic respiratory failure deemed due to AECOPD 05/18 Echo:The estimated ejection   fraction was in the range of 55% to 60%. Wall motion was normal. Doppler parameters are con sistent with abnormal left ventricular relaxation (grade 1 diastolic dysfunction).  05/19 Transferred to ICU/SDU for BiPAP. Failed BiPAP and intubated 05/20 Renal US: No cause for acute renal failure identified. Both kidneys are relatively small, measuring less than 9 cm  05/21 very agitated on WUA. No weaning 05/22 Very agitated on WUA. Dexmedetomidine initiated 05/23 increased airflow obstruction. Precedex changed to propofol. Continued problems with agitation on WUA 05/24 persistent severe airflow obstruction. PSV as tolerated. 05/25 family meeting held with pts 3 daughters to discuss goals of care pts daughters stated they would like to proceed with tracheostomy and peg tube placement if indicated. They also wanted to change pts code status to limited code to include NO CPR, ACLS medications, or defibrillation and/or cardioversion 05/28 Met with family, discussed status and plan, continue to want trach.  05/29 Eldest daughter continues to want trach, confirms DNR status. ; failed weaning trial.  05/30 Failed weaning trial due to tachypnea and tachycardia within 15 minutes.  05/31 tracheostomy tube placed 06/01 PEG tube placed 06/02 failed weaning.  06/04 on trach collar all day 06/05 comfortable on trach collar. Copious purulent tracheal secretions 06/06 remains comfortable on trach collar. Remains mildly confused. Still with significant tracheal secretions 06/09 continues to have copious purulent secretions from  tracheostomy tube. Tracheostomy tube changed to uncuffed #6 Shiley  INDWELLING DEVICES:: ETT 05/19 >> 05/31 L IJ CVL 05/19 >> 06/04 Trach tube 05/31 >>   MICRO DATA: MRSA PCR 05/19 >> NEG Urine 05/20 >> NEG Resp 06/05 >> abundant GPC's in pairs, moderate GNR's, few GPRs >> consistent with normal flora  ANTIMICROBIALS:  Levofloxacin 05/19 >> 05/22 Ceftriaxone 05/22 >>05/25 Levofloxacin 06/05 >>   SUBJ: No respiratory distress. He is having a great deal of pain due to the erosions under the flanges of the tracheostomy tube. Follows commands. RASS 0  VITAL SIGNS: BP 137/69   Pulse 94   Temp 98.7 F (37.1 C)   Resp (!) 22   Ht 6' (1.829 m)   Wt 115 lb 1.3 oz (52.2 kg)   SpO2 97%   BMI 15.61 kg/m   HEMODYNAMICS:    VENTILATOR SETTINGS: FiO2 (%):  [40 %] 40 %  INTAKE / OUTPUT: I/O last 3 completed shifts: In: 3220 [I.V.:10; Other:220; NG/GT:2990] Out: 350 [Urine:350]  PHYSICAL EXAMINATION: Gen: No distress on ATC, RASS 0 Neuro: No focal deficits, diffusely weak, follows commands  HEENT: NCAT, sclerae white, very poor dentition Neck:; Erosion under flanges of tracheostomy tube with purulent drainage Lungs: rhonchi throughout, even, non labored on trach collar  Cardiovascular: nsr, regular, no M/R/G Abdomen: soft, +BS x4, soft, non tender, non distended  Extremities: Warm, no edema Skin: intact no rashes or lesions  LABS:  BMET  Recent Labs Lab 11/01/16 0520 11/03/16 0446 11/05/16 0439  NA 154* 148* 145  K 3.8 3.6 3.7  CL 116* 108 107  CO2 31 30 30   BUN 25* 22* 21*  CREATININE 0.86 0.76 0.83  GLUCOSE 149* 145* 146*    Electrolytes  Recent Labs Lab 10/31/16 1118 11/01/16 0520 11/03/16 0446 11/05/16 0439  CALCIUM 8.9 9.1 9.2 8.7*  MG 2.4  --   --   --   PHOS 2.8  --   --   --     CBC  Recent Labs Lab 10/31/16 1118 11/01/16 0520 11/05/16 0439  WBC 15.6* 17.2* 8.2  HGB 10.8* 12.1* 10.8*  HCT 32.9* 37.0* 32.7*  PLT 265 258 229     ABG No results for input(s): PHART, PCO2ART, PO2ART in the last 168 hours.    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 Prolonged mechanical ventilation-resolved remains on trach collar  Purulent tracheobronchitis-improving  Hypertension, marginally controlled Hypernatremia G tube status Protein-calorie malnutrition  DM 2 with mild hyperglycemia Steroid induced hyperglycemia Profound deconditioning  P: Continue nebulized bronchodilators Continue supplemental oxygen  Continue empiric levofloxacin initiated 06/05 Continue diltiazem initiated 06/05 Continue tube feeds and free water repletion Continue physical therapy and OT  Advance activity Now that tracheostomy tube has been changed, we can consider Passy-Muir valve Discharge planning in process currently waiting on a bed at [facility]   His nursing needs continue to you to exceed what can be provided on a general medicine floor. Continue ST you status   Merton Border, MD PCCM service Mobile [phone] Pager [phone] 11/06/2016 12:03 PM

## 2016-11-06 NOTE — Progress Notes (Signed)
eLink Physician-Brief Progress Note Patient Name: Kevin BurrowJoseph Holden DOB: 12/01/1950 MRN: [id number]   Date of Service  11/06/2016  HPI/Events of Note  Increasing dyspnea and sinus tachycardia.  Patient with severe COPD.  CXR w/o acute change  eICU Interventions  Place on vent via trach Albuterol/atrovent treatment.       Intervention Category Major Interventions: Respiratory failure - evaluation and management  Henry RusselSMITH, Kaho Selle, P 11/06/2016, 9:33 PM

## 2016-11-07 DIAGNOSIS — L8990 Pressure ulcer of unspecified site, unspecified stage: Secondary | ICD-10-CM

## 2016-11-07 MED ORDER — FREE WATER
200.0000 mL | Freq: Three times a day (TID) | Status: DC
  Administered 2016-11-07 – 2016-11-09 (×6): 200 mL

## 2016-11-07 NOTE — Progress Notes (Signed)
0028- pt temp 102.3, MD elink called Deter[facility], informed of pt progress, gave tylendol.

## 2016-11-07 NOTE — Progress Notes (Signed)
2030- pt tachy, SOB, complaining chest pain, called elink MD Henry RusselJames Smith - CTX/ EKG  2137- pt put back on Vent/ anxiety has decrease

## 2016-11-07 NOTE — Progress Notes (Signed)
Good day. Trach collar 40-70% on all day. No respiratory distress.Up in chair x 3 hours. Very alert. Continues to have copious tan secretions from tracheostomy. Broken area to left of tracheostomy is stage 3 due to emaciated patient condition.Broken area to right of tracheostomy is unchanged. Referral to ENT for help with breakdown areas around tracheostomy.Sacral breakdown is unchanged. Given prn for pain x 2 for neck pain

## 2016-11-07 NOTE — Progress Notes (Signed)
[facility] [place] at [facility]   PATIENT NAME: Kevin Holden    MR#:  [id number]  DATE OF BIRTH:  04/11/1951  SUBJECTIVE:  Awake.    Had trach 10/28/16 and PEG 10/29/16. Off vent. TFs Awake. Confused at times  Purulent secretions from trach. Fever 102 overnight  REVIEW OF SYSTEMS:   Review of Systems  Unable to perform ROS: Intubated    DRUG ALLERGIES:   Allergies  Allergen Reactions  . Aspirin Other (See Comments)    Unknown  . Celecoxib Other (See Comments)    Reaction: Unknown  . Nsaids Other (See Comments)    Reaction: Unknown    VITALS:  Blood pressure 106/80, pulse 95, temperature 98.9 F (37.2 C), resp. rate 14, height 6' (1.829 m), weight 52.2 kg (115 lb 1.3 oz), SpO2 95 %.  PHYSICAL EXAMINATION:   Physical Exam  GENERAL:  66 y.o.-year-old patient lying in the bed  EYES: Pupils equal, round, reactive to light and accommodation. No scleral icterus. Extraocular muscles intact.  HEENT: Head atraumatic, normocephalic. Oropharynx and nasopharynx clear. Tracheostomy tube in place NECK:  Supple, no jugular venous distention. No thyroid enlargement, no tenderness.  LUNGS: Normal breath sounds bilaterally, no wheezing, rales, rhonchi. No use of accessory muscles of respiration.  CARDIOVASCULAR: S1, S2 normal. No murmurs, rubs, or gallops.  ABDOMEN: Soft, nontender, nondistended. Bowel sounds present. No organomegaly or mass. PEG tube in place. EXTREMITIES: No cyanosis, clubbing or edema b/l.    NEUROLOGIC: alert and awake. Moves all 4 extremities  LABORATORY PANEL:  CBC  Recent Labs Lab 11/05/16 0439  WBC 8.2  HGB 10.8*  HCT 32.7*  PLT 229    Chemistries   Recent Labs Lab 11/05/16 0439  NA 145  K 3.7  CL 107  CO2 30  GLUCOSE 146*  BUN 21*  CREATININE 0.83  CALCIUM 8.7*   Cardiac Enzymes No results for input(s): TROPONINI in the last 168 hours. RADIOLOGY:  Dg Chest 1 View  Result Date: 11/06/2016 CLINICAL DATA:   Acute onset chest pain and shortness of breath. EXAM: CHEST 1 VIEW COMPARISON:  11/02/2016. FINDINGS: Normal sized heart. Clear lungs. The lungs remain hyperexpanded with minimally prominent interstitial markings. Stable tracheostomy tube in satisfactory position. Unremarkable bones. IMPRESSION: No acute abnormality.  Stable changes of COPD. Electronically Signed   By: Beckie SaltsSteven  Reid M.D.   On: 11/06/2016 20:44   ASSESSMENT AND PLAN:   Kevin Holden is a 66 y.o. black male Asthma/COPD, hypertension, prior history of pneumonia, who [facility]for evaluation of increasing shortness of breath. COPD exacerbation   # Acute on chronic respiratory failure with hypoxia due to COPD exacerbation and pneumonia # Septic shock secondary to pneumonia - resolved # tarcheobronchitis # Hypernatremia.Resolved  # COPD exacerbation due to acute bronchitis # Acute kidney injury due to ATN from hypotension # Chronic Tobacco abuse  Fever overnight. On Levaquin. Tracheotomy downsized Waiting for LTAC Pulmonary on board  CODE STATUS: Partial  DVT Prophylaxis: Lovenox  TOTAL TIME TAKING CARE OF THIS PATIENT: 20 minutes.  Milagros LollSudini, Sharonann Malbrough R M.D on 11/07/2016 at 11:43 AM  Between 7am to 6pm - Pager - [phone]  After 6pm go to [email or web address] - password [email or web address]  [facility]  Office  [phone]  CC: Primary care physician; Leanna SatoMiles, Linda M, MD    Note: This dictation was prepared with Dragon dictation along with smaller phrase technology. Any transcriptional errors that result from this process are unintentional.

## 2016-11-07 NOTE — Progress Notes (Signed)
PULMONARY / CRITICAL CARE MEDICINE   Name: Kevin Holden MRN: [id number] DOB: 1950-09-10    ADMISSION DATE:  10/15/2016    PT PROFILE:   66 y.o. male smoker intubated after failing BiPAP with diagnosis of AECOPD  MAJOR EVENTS/TEST RESULTS: 05/18 Admitted by Hospitalist service with acute on chronic respiratory failure deemed due to AECOPD 05/18 Echo:The estimated ejection   fraction was in the range of 55% to 60%. Wall motion was normal. Doppler parameters are con sistent with abnormal left ventricular relaxation (grade 1 diastolic dysfunction).  05/19 Transferred to ICU/SDU for BiPAP. Failed BiPAP and intubated 05/20 Renal US: No cause for acute renal failure identified. Both kidneys are relatively small, measuring less than 9 cm  05/21 very agitated on WUA. No weaning 05/22 Very agitated on WUA. Dexmedetomidine initiated 05/23 increased airflow obstruction. Precedex changed to propofol. Continued problems with agitation on WUA 05/24 persistent severe airflow obstruction. PSV as tolerated. 05/25 family meeting held with pts 3 daughters to discuss goals of care pts daughters stated they would like to proceed with tracheostomy and peg tube placement if indicated. They also wanted to change pts code status to limited code to include NO CPR, ACLS medications, or defibrillation and/or cardioversion 05/28 Met with family, discussed status and plan, continue to want trach.  05/29 Eldest daughter continues to want trach, confirms DNR status. ; failed weaning trial.  05/30 Failed weaning trial due to tachypnea and tachycardia within 15 minutes.  05/31 tracheostomy tube placed 06/01 PEG tube placed 06/02 failed weaning.  06/04 on trach collar all day 06/05 comfortable on trach collar. Copious purulent tracheal secretions 06/06 remains comfortable on trach collar. Remains mildly confused. Still with significant tracheal secretions 06/09 continues to have copious purulent secretions from  tracheostomy tube. Tracheostomy tube changed to uncuffed #6 Shiley. In the evening, he had increased shortness of breath. His tracheostomy tube was changed to a cuffed Shiley. He was placed back on the ventilator for the night. 06/10 back on trach collar. Comfortable. Cognition intact.  INDWELLING DEVICES:: ETT 05/19 >> 05/31 L IJ CVL 05/19 >> 06/04 Trach tube 05/31 >>   MICRO DATA: MRSA PCR 05/19 >> NEG Urine 05/20 >> NEG Resp 06/05 >>  normal flora  ANTIMICROBIALS:  Levofloxacin 05/19 >> 05/22 Ceftriaxone 05/22 >>05/25 Levofloxacin 06/05 >>   SUBJ: Events of last night noted. He is now back on trach collar and appears comfortable. His cognition is intact. He continues to have cough and purulent sputum. His tracheostomy site continues to look bad with a skin erosion underneath the flange of the trach tube.  VITAL SIGNS: BP 135/87   Pulse (!) 103   Temp 98.1 F (36.7 C)   Resp 14   Ht 6' (1.829 m)   Wt 115 lb 1.3 oz (52.2 kg)   SpO2 98%   BMI 15.61 kg/m   HEMODYNAMICS:    VENTILATOR SETTINGS: Vent Mode: PRVC FiO2 (%):  [40 %-70 %] 70 % Set Rate:  [14 bmp] 14 bmp Vt Set:  [500 mL-550 mL] 500 mL PEEP:  [5 cmH20] 5 cmH20  INTAKE / OUTPUT: I/O last 3 completed shifts: In: 3302.8 [Other:550; NG/GT:2752.8] Out: 1300 [Urine:1300]  PHYSICAL EXAMINATION: Gen: No distress on ATC, RASS 0 Neuro: No focal deficits, diffusely weak, follows commands  HEENT: NCAT, sclerae white, very poor dentition Neck:; Erosion under flanges of tracheostomy tube with purulent drainage Lungs: Few scattered rhonchi, no wheezes, comfortable on trach collar  Cardiovascular: nRegular, no murmurs noted Abdomen: Bowel sounds present,  soft, non tender, non distended  Extremities: Warm, no edema Skin: intact no rashes or lesions  LABS:  BMET  Recent Labs Lab 11/01/16 0520 11/03/16 0446 11/05/16 0439  NA 154* 148* 145  K 3.8 3.6 3.7  CL 116* 108 107  CO2 _0 BUN 25* 22* 21*   CREATININE 0.86 0.76 0.83  GLUCOSE 149* 145* 146*    Electrolytes  Recent Labs Lab 11/01/16 0520 11/03/16 0446 11/05/16 0439  CALCIUM 9.1 9.2 8.7*    CBC  Recent Labs Lab 11/01/16 0520 11/05/16 0439  WBC 17.2* 8.2  HGB 12.1* 10.8*  HCT 37.0* 32.7*  PLT 258 229    ABG No results for input(s): PHART, PCO2ART, PO2ART in the last 168 hours.    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 Prolonged mechanical ventilation - tolerating trach collar Purulent tracheobronchitis Hypertension, marginally controlled Hypernatremia, resolved G tube status Protein-calorie malnutrition  DM 2 with mild hyperglycemia, controlled Steroid induced hyperglycemia - prednisone taper has been completed Profound deconditioning  P: Continue nebulized bronchodilators and steroids Continue supplemental oxygen  Continue mechanical ventilation as needed Continue empiric levofloxacin initiated 06/05 - 7 day course ordered Recheck chest x-ray 06/11 Continue diltiazem initiated 06/05 Continue tube feeds and free water repletion Recheck chemistry panel 06/11 Continue PT and OT  Passy-Muir valve per SLP as tolerated I have requested that ENT return on 06/11 to evaluate tracheostomy site Discharge planning in process currently waiting on a bed at [facility], [place] PCCM service Mobile [phone] Pager [phone] 11/07/2016 12:55 PM

## 2016-11-08 ENCOUNTER — Inpatient Hospital Stay: Payer: Medicare Other

## 2016-11-08 LAB — BASIC METABOLIC PANEL
Anion gap: 7 (ref 5–15)
BUN: 16 mg/dL (ref 6–20)
CALCIUM: 8.6 mg/dL — AB (ref 8.9–10.3)
CO2: 31 mmol/L (ref 22–32)
Chloride: 96 mmol/L — ABNORMAL LOW (ref 101–111)
Creatinine, Ser: 0.73 mg/dL (ref 0.61–1.24)
GFR calc Af Amer: >60 mL/min (ref >60)
GLUCOSE: 124 mg/dL — AB (ref 65–99)
POTASSIUM: 3.9 mmol/L (ref 3.5–5.1)
Sodium: 134 mmol/L — ABNORMAL LOW (ref 135–145)

## 2016-11-08 LAB — CBC
HEMATOCRIT: 30.8 % — AB (ref 40.0–52.0)
Hemoglobin: 10.5 g/dL — ABNORMAL LOW (ref 13.0–18.0)
MCH: 28.3 pg (ref 26.0–34.0)
MCHC: 34.1 g/dL (ref 32.0–36.0)
MCV: 83 fL (ref 80.0–100.0)
Platelets: 240 10*3/uL (ref 150–440)
RBC: 3.71 MIL/uL — ABNORMAL LOW (ref 4.40–5.90)
RDW: 14.6 % — AB (ref 11.5–14.5)
WBC: 7.3 10*3/uL (ref 3.8–10.6)

## 2016-11-08 MED ORDER — PRO-STAT SUGAR FREE PO LIQD
30.0000 mL | Freq: Two times a day (BID) | ORAL | Status: DC
  Administered 2016-11-08 – 2016-11-09 (×2): 30 mL

## 2016-11-08 NOTE — Progress Notes (Signed)
Initial Nutrition Assessment  DOCUMENTATION CODES:   Severe malnutrition in context of chronic illness  INTERVENTION:   Continue Vital 1.5 @45ml /hr via PEG  Prostat via PEG tube BID  Free water 200ml q 8 hrs per MD  Regimen provides 1820kcal/day, 103g/day protein, 1425ml free water   MVI  NUTRITION DIAGNOSIS:   Malnutrition (severe) related to catabolic illness, COPD, PNA as evidenced by severe depletion of muscle mass, severe depletion of body fat.  GOAL:   Patient will meet greater than or equal to 90% of their needs  MONITOR:   PO intake, Supplement acceptance, Labs, Weight trends  ASSESSMENT:   66 year old male with h/o HTN and COPD admitted for acute on chronic respiratory failure with hypoxemia.   Pt re-intubated 6/11. Pt currently with tracheostomy. ENT evaluation pending for purulent drainage from trach site. Pt remains on vent. AKI resolved. Tolerating tube feeds.   Medications reviewed and include: colace, lovenox, MVI, miralax, fentanyl  Labs reviewed: Na 134(L), Cl 96(L), Ca 8.6(L) P 2.8 wnl, Mg 2.4 wnl- labs from 6/3  Patient is currently intubated on ventilator support MV: 14 L/min Temp (24hrs), Avg:98.9 F (37.2 C), Min:98.6 F (37 C), Max:99 F (37.2 C)  Propofol: none   Diet Order:   NPO  Skin:  Wound (see comment) (Stage II sacrum, unstageable wound chest)  Last BM:  6/9  Height:   Ht Readings from Last 1 Encounters:  10/16/16 6' (1.829 m)    Weight:   Wt Readings from Last 1 Encounters:  11/01/16 115 lb 1.3 oz (52.2 kg)    Ideal Body Weight:  80.9 kg  BMI:  Body mass index is 15.61 kg/m.  Estimated Nutritional Needs:   Kcal:  1730kcal/day   Protein:  94-104g/day  Fluid:  471ml/kcal/day   EDUCATION NEEDS:   Education needs no appropriate at this time  Betsey Holidayasey Tylar Amborn MS, RD, LDN Pager #- [phone]

## 2016-11-08 NOTE — Progress Notes (Signed)
Pt placed on Trach Collar at 50%, pt states that he is more comfortable and fills better off vent, will continue to monitor and leave on trach collar as tolerated

## 2016-11-08 NOTE — Care Management (Signed)
UMNurse Lisa's number PH#: [phone]<tel:[phone]

## 2016-11-08 NOTE — Consult Note (Signed)
Kevin Holden, Kevin Holden [id number] [birth year] Kevin Holden, [name], MD  Reason for Consult: Reevaluate trach site  HPI: The patient had a tracheostomy place about 12 days ago for vent dependent trach. He had the trach tube changed 2 days ago from an 8 Shiley DCT to a 6 Shiley DCT. He is a lot of oral secretions that are common through the tube and around the tube. He's had some skin irritation as well at the stoma site and assessment was made of his trachea see what would be the next best step.  Allergies:  Allergies  Allergen Reactions  . Aspirin Other (See Comments)    Unknown  . Celecoxib Other (See Comments)    Reaction: Unknown  . Nsaids Other (See Comments)    Reaction: Unknown    ROS: Review of systems normal other than 12 systems except per HPI.  PMH:  Past Medical History:  Diagnosis Date  . Asthma   . COPD (chronic obstructive pulmonary disease) (HCC)   . HTN (hypertension)   . Pneumonia     FH: History reviewed. No pertinent family history.  SH:  Social History   Social History  . Marital status: Single    Spouse name: N/A  . Number of children: N/A  . Years of education: N/A   Occupational History  . Not on file.   Social History Main Topics  . Smoking status: Current Every Day Smoker    Packs/day: 1.00    Years: 40.00    Types: Cigarettes  . Smokeless tobacco: Never Used  . Alcohol use No  . Drug use: No  . Sexual activity: No   Other Topics Concern  . Not on file   Social History Narrative  . No narrative on file    PSH:  Past Surgical History:  Procedure Laterality Date  . ABDOMINAL SURGERY    . BACK SURGERY    . PEG PLACEMENT N/A 10/29/2016   Procedure: PERCUTANEOUS ENDOSCOPIC GASTROSTOMY (PEG) PLACEMENT;  Surgeon: Midge MiniumWohl, Darren, MD;  Location: [facility] ENDOSCOPY;  Service: Endoscopy;  Laterality: N/A;  . PERIPHERAL VASCULAR CATHETERIZATION Right 05/27/2016   Procedure: Lower Extremity Angiography;  Surgeon: Annice NeedyJason S Dew, MD;  Location: [facility] INVASIVE CV LAB;   Service: Cardiovascular;  Laterality: Right;  . PERIPHERAL VASCULAR CATHETERIZATION N/A 05/27/2016   Procedure: Abdominal Aortogram w/Lower Extremity;  Surgeon: Annice NeedyJason S Dew, MD;  Location: [facility] INVASIVE CV LAB;  Service: Cardiovascular;  Laterality: N/A;  . PERIPHERAL VASCULAR CATHETERIZATION  05/27/2016   Procedure: Lower Extremity Intervention;  Surgeon: Annice NeedyJason S Dew, MD;  Location: [facility] INVASIVE CV LAB;  Service: Cardiovascular;;  . TRACHEOSTOMY TUBE PLACEMENT N/A 10/28/2016   Procedure: TRACHEOSTOMY;  Surgeon: Bud FaceVaught, Creighton, MD;  Location: [facility] ORS;  Service: ENT;  Laterality: N/A;  . VASCULAR SURGERY     2016 balloon in vein in right leg    Physical  Exam:  Neck supple with no masses or lesions. No lymphadenopathy palpated. Trach tube is sitting in a good position in the neck. The stoma site is healing well. There is excoriation of some the skin underneath the trach stoma where the sutures were that of pulled through the skin and left healing wound. He is aphonic currently as all the air was coming out through the trach tube. He is on a trach collar currently but was on the vent earlier today. His cough is deflated.   A/P: Patient is tolerating the intermediate size trach tube very well. The cuff is deflated which is allowing some of the  saliva from his larynx to roll down into his trachea that he is coughing out the trach stoma. He is a good candidate for trials of speaking valve to see how he does. This would force air up through his larynx to help increase the competency of his larynx and to help prevent aspiration of saliva. If he tolerates the speaking valve well, then I would continue with the current size trach tube,, but possibly move to a cuff less tube if he does not need the ventilator at all. When his lungs are strong enough for possible decannulation, then moving to a smaller trach tube and doing capping trials would be the next step prior to decannulation. The skin wounds beneath  the trach tube will heal on their own. Using optifoam or some type of wound dressing over the top of him his best until the skin heals over.  If he has other questions please call.   Kevin Holden H 11/08/2016 6:30 PM

## 2016-11-08 NOTE — Progress Notes (Signed)
PULMONARY / CRITICAL CARE MEDICINE   Name: Kevin Holden MRN: [id number] DOB: [birth year]    ADMISSION DATE:  10/15/2016    PT PROFILE:   66 y.o. male smoker intubated after failing BiPAP with diagnosis of AECOPD  MAJOR EVENTS/TEST RESULTS: 05/18 Admitted by Hospitalist service with acute on chronic respiratory failure deemed due to AECOPD 05/18 Echo:The estimated ejection   fraction was in the range of 55% to 60%. Wall motion was normal. Doppler parameters are con sistent with abnormal left ventricular relaxation (grade 1 diastolic dysfunction).  05/19 Transferred to ICU/SDU for BiPAP. Failed BiPAP and intubated 05/20 Renal US: No cause for acute renal failure identified. Both kidneys are relatively small, measuring less than 9 cm  05/21 very agitated on WUA. No weaning 05/22 Very agitated on WUA. Dexmedetomidine initiated 05/23 increased airflow obstruction. Precedex changed to propofol. Continued problems with agitation on WUA 05/24 persistent severe airflow obstruction. PSV as tolerated. 05/25 family meeting held with pts 3 daughters to discuss goals of care pts daughters stated they would like to proceed with tracheostomy and peg tube placement if indicated. They also wanted to change pts code status to limited code to include NO CPR, ACLS medications, or defibrillation and/or cardioversion 05/28 Met with family, discussed status and plan, continue to want trach.  05/29 Eldest daughter continues to want trach, confirms DNR status. ; failed weaning trial.  05/30 Failed weaning trial due to tachypnea and tachycardia within 15 minutes.  05/31 tracheostomy tube placed 06/01 PEG tube placed 06/02 failed weaning.  06/04 on trach collar all day 06/05 comfortable on trach collar. Copious purulent tracheal secretions 06/06 remains comfortable on trach collar. Remains mildly confused. Still with significant tracheal secretions 06/09 continues to have copious purulent secretions from  tracheostomy tube. Tracheostomy tube changed to uncuffed #6 Shiley. Failed weaning. In the evening, he had increased shortness of breath. His tracheostomy tube was changed to a cuffed Shiley. He was placed back on the ventilator for the night. 06/10 back on trach collar. Comfortable. Cognition intact. 06/11 had to be placed back on ventilator overnight.   INDWELLING DEVICES:: ETT 05/19 >> 05/31 L IJ CVL 05/19 >> 06/04 Trach tube 05/31 >>   MICRO DATA: MRSA PCR 05/19 >> NEG Urine 05/20 >> NEG Resp 06/05 >>  normal flora  ANTIMICROBIALS:  Levofloxacin 05/19 >> 05/22 Ceftriaxone 05/22 >>05/25 Levofloxacin 06/05 >>   SUBJ: Patient is awake, does not appear to be in any acute distress.  VITAL SIGNS: BP 106/65 (BP Location: Left Wrist)   Pulse (!) 103   Temp 99 F (37.2 C) (Oral)   Resp 19   Ht 6' (1.829 m)   Wt 115 lb 1.3 oz (52.2 kg)   SpO2 97%   BMI 15.61 kg/m   HEMODYNAMICS:    VENTILATOR SETTINGS: Vent Mode: PRVC FiO2 (%):  [40 %] 40 % Set Rate:  [14 bmp] 14 bmp Vt Set:  [500 mL] 500 mL PEEP:  [5 cmH20] 5 cmH20 Plateau Pressure:  [16 cmH20] 16 cmH20  INTAKE / OUTPUT: I/O last 3 completed shifts: In: 1587.8 [Other:60; NG/GT:1527.8] Out: 1525 [Urine:1525]  PHYSICAL EXAMINATION: Gen: No distress on ATC, RASS 0 Neuro: No focal deficits, diffusely weak, follows commands  HEENT: NCAT, sclerae white, very poor dentition Neck:; Erosion under flanges of tracheostomy tube with purulent drainage Lungs: Decreased air entry in both lungs. Cardiovascular: Regular, no murmurs noted Abdomen: Bowel sounds present, soft, non tender, non distended  Extremities: Warm, no edema Skin: intact no rashes or  lesions  LABS:  BMET  Recent Labs Lab 11/03/16 0446 11/05/16 0439 11/08/16 0402  NA 148* 145 134*  K 3.6 3.7 3.9  CL 108 107 96*  CO2 30 30 31   BUN 22* 21* 16  CREATININE 0.76 0.83 0.73  GLUCOSE 145* 146* 124*    Electrolytes  Recent Labs Lab 11/03/16 0446  11/05/16 0439 11/08/16 0402  CALCIUM 9.2 8.7* 8.6*    CBC  Recent Labs Lab 11/05/16 0439 11/08/16 0402  WBC 8.2 7.3  HGB 10.8* 10.5*  HCT 32.7* 30.8*  PLT 229 240    ABG No results for input(s): PHART, PCO2ART, PO2ART in the last 168 hours.    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 Prolonged mechanical ventilation - tolerating trach collar Purulent tracheobronchitis Hypertension, marginally controlled Hypernatremia, resolved G tube status Protein-calorie malnutrition  DM 2 with mild hyperglycemia, controlled Steroid induced hyperglycemia - prednisone taper has been completed Profound deconditioning  P: Continue nebulized bronchodilators and steroids Continue supplemental oxygen  Continue mechanical ventilation as needed; qhs and prn.  Continue empiric levofloxacin initiated 06/05 - 7 day course ordered Recheck chest x-ray 06/11 Continue diltiazem initiated 06/05 Continue tube feeds and free water repletion Recheck chemistry panel 06/11 Continue PT and OT  Passy-Muir valve per SLP as tolerated I have requested that ENT return on 06/11 to evaluate tracheostomy site Discharge planning in process currently [facility] a bed at [facility] M[facility].  PCCM service Pager [phone] 11/08/2016 9:12 AM

## 2016-11-08 NOTE — Care Management (Signed)
Patient is still trach vented which has been confirmed with bedside RN contrary to 10:43AM physician note.

## 2016-11-08 NOTE — Care Management (Signed)
PRECERT DENIED: GBTilda Burrow: Springsteen, Landen DOB: Aug 02, 1950 [employer] MCR HMO ID# [id number] REF# [id number]: Rationale: - Did not meet LTAC criteria  - Acute care is expected to complete in the current facility which is able to provide medically necessary care, transfer to acute is not reasonable and necessary.  If you can pls kindly provide me MD information (full name, specialization, if attending to pt's care, direct number) I will be happy to collaborate and educate regarding PTP. As per Misty StanleyLisa [facility] we only have until tom June 5th for PTP before 3pm.  Peer To Peer: Dr. Ardyth Manam will contact Michaelene SongMelanie Oruga RN, BSN [facility] Office Phone: [phone] Fax Number: [phone]

## 2016-11-08 NOTE — Progress Notes (Signed)
Continuing to follow along at a distance.  Note pt. placed back on vent this am for increased WOB.     Weldon PickingSusan Ellieanna Funderburg PT Inpatient Rehab Admissions Coordinator Cell [phone] Office [phone]

## 2016-11-08 NOTE — Progress Notes (Signed)
Pt in resp distress with increased WOB. Pt placed on vent w/o complications. Pt resting comfortably at this time.

## 2016-11-08 NOTE — Progress Notes (Addendum)
[facility] - [place] at [facility]   PATIENT NAME: Kevin BurrowJoseph Holden    MR#:  [id number]  DATE OF BIRTH:  23-Nov-1950  SUBJECTIVE:   evaluated by ENT last night And taken of vent yesterday   REVIEW OF SYSTEMS:    Review of Systems  Constitutional: Negative for fever, chills weight loss HENT: Negative for ear pain, nosebleeds, congestion, facial swelling, rhinorrhea, neck pain, neck stiffness and ear discharge.   Respiratory: Positive for cough and some mild shortness of breath, no wheezing  Cardiovascular: Negative for chest pain, palpitations and leg swelling.  Gastrointestinal: Negative for heartburn, abdominal pain, vomiting, diarrhea or consitpation Genitourinary: Negative for dysuria, urgency, frequency, hematuria Musculoskeletal: Negative for back pain or joint pain Neurological: Negative for dizziness, seizures, syncope, focal weakness,  numbness and headaches.  Hematological: Does not bruise/bleed easily.  Psychiatric/Behavioral: Negative for hallucinations, confusion, dysphoric mood    Tolerating Diet:yes TF      DRUG ALLERGIES:   Allergies  Allergen Reactions  . Aspirin Other (See Comments)    Unknown  . Celecoxib Other (See Comments)    Reaction: Unknown  . Nsaids Other (See Comments)    Reaction: Unknown    VITALS:  Blood pressure 106/65, pulse (!) 103, temperature 99 F (37.2 C), temperature source Oral, resp. rate 19, height 6' (1.829 m), weight 52.2 kg (115 lb 1.3 oz), SpO2 97 %.  PHYSICAL EXAMINATION:  Constitutional: Appears well-developed and well-nourished. No distress. HENT: Normocephalic. Marland Kitchen. Oropharynx is clear and moist.  Tracheostomy site covered Eyes: Conjunctivae and EOM are normal. PERRLA, no scleral icterus.  Neck: Normal ROM. Neck supple. No JVD. No tracheal deviation. CVS: RRR, S1/S2 +, no murmurs, no gallops, no carotid bruit.  Pulmonary: Effort and breath sounds normal, no stridor, rhonchi, wheezes, rales.   Abdominal: Soft. BS +,  no distension, tenderness, rebound or guarding.  Musculoskeletal: Normal range of motion. No edema and no tenderness.  Neuro: Alert. CN 2-12 grossly intact. No focal deficits. Skin: Skin is warm and dry. No rash noted. Psychiatric: Normal mood and affect.      LABORATORY PANEL:   CBC  Recent Labs Lab 11/08/16 0402  WBC 7.3  HGB 10.5*  HCT 30.8*  PLT 240   ------------------------------------------------------------------------------------------------------------------  Chemistries   Recent Labs Lab 11/08/16 0402  NA 134*  K 3.9  CL 96*  CO2 31  GLUCOSE 124*  BUN 16  CREATININE 0.73  CALCIUM 8.6*   ------------------------------------------------------------------------------------------------------------------  Cardiac Enzymes No results for input(s): TROPONINI in the last 168 hours. ------------------------------------------------------------------------------------------------------------------  RADIOLOGY:  Dg Chest 1 View  Result Date: 11/06/2016 CLINICAL DATA:  Acute onset chest pain and shortness of breath. EXAM: CHEST 1 VIEW COMPARISON:  11/02/2016. FINDINGS: Normal sized heart. Clear lungs. The lungs remain hyperexpanded with minimally prominent interstitial markings. Stable tracheostomy tube in satisfactory position. Unremarkable bones. IMPRESSION: No acute abnormality.  Stable changes of COPD. Electronically Signed   By: Beckie SaltsSteven  Reid M.D.   On: 11/06/2016 20:44   Dg Chest Port 1 View  Result Date: 11/08/2016 CLINICAL DATA:  Initial evaluation for respiratory failure. EXAM: PORTABLE CHEST 1 VIEW COMPARISON:  Prior radiograph from 11/06/2016. FINDINGS: Tracheostomy tube overlies the upper airway. Cardiac and mediastinal silhouettes are stable, and remain within normal limits. Aortic atherosclerosis. Lungs well inflated. Underlying changes related COPD. No new focal infiltrates. No pulmonary edema or definite pleural effusion. No  pneumothorax. Osseous structures unchanged. IMPRESSION: 1. COPD.  No other active cardiopulmonary disease identified. 2. Tracheostomy tube in place at  the upper airway. Electronically Signed   By: Rise Mu M.D.   On: 11/08/2016 06:03     ASSESSMENT AND PLAN:  66 year old male with history of COPD who was admitted May 18 for shortness of breath and COPD exacerbation.  1. Acute on chronic hypercarbic/hypoxic respiratory failure status post reintubation 6/11 due to acute exacerbation of COPD and pneumonia Continue Levaquin through today Patient currently with tracheostomy/trach collar ENT evaluation appreciated see RECS from (6/11)   2. Septic shock secondary to pneumonia: This has resolved  3. Acute kidney injury due to ATN from hypotension: This has resolved  Patient has been denied for LTAC Discussed with intensivist who will talk with insurance company for appeal.  Management plans discussed with the patient and he is in agreement.  CODE STATUS:partial  TOTAL TIME TAKING CARE OF THIS PATIENT: 22 minutes.     POSSIBLE D/C ??, DEPENDING ON CLINICAL CONDITION.   [name] M.D on 11/08/2016 at 10:43 AM  Between 7am to 6pm - Pager - [phone] After 6pm go to [email or web address] - password [email or web address]  [facility] [place]  Office  [phone]  CC: Primary care physician; Leanna Sato, MD  Note: This dictation was prepared with Dragon dictation along with smaller phrase technology. Any transcriptional errors that result from this process are unintentional.

## 2016-11-09 ENCOUNTER — Ambulatory Visit (HOSPITAL_COMMUNITY)
Admission: AD | Admit: 2016-11-09 | Discharge: 2016-11-09 | Disposition: A | Discharge status: Home / Self Care | Payer: Medicare Other | Source: Other Acute Inpatient Hospital | Attending: Pulmonary Disease | Admitting: Pulmonary Disease

## 2016-11-09 DIAGNOSIS — J449 Chronic obstructive pulmonary disease, unspecified: Secondary | ICD-10-CM | POA: Insufficient documentation

## 2016-11-09 DIAGNOSIS — J9621 Acute and chronic respiratory failure with hypoxia: Secondary | ICD-10-CM | POA: Insufficient documentation

## 2016-11-09 LAB — BASIC METABOLIC PANEL
ANION GAP: 7 (ref 5–15)
BUN: 14 mg/dL (ref 6–20)
CHLORIDE: 95 mmol/L — AB (ref 101–111)
CO2: 34 mmol/L — ABNORMAL HIGH (ref 22–32)
Calcium: 8.8 mg/dL — ABNORMAL LOW (ref 8.9–10.3)
Creatinine, Ser: 0.58 mg/dL — ABNORMAL LOW (ref 0.61–1.24)
Glucose, Bld: 113 mg/dL — ABNORMAL HIGH (ref 65–99)
POTASSIUM: 4 mmol/L (ref 3.5–5.1)
SODIUM: 136 mmol/L (ref 135–145)

## 2016-11-09 LAB — MAGNESIUM: MAGNESIUM: 1.9 mg/dL (ref 1.7–2.4)

## 2016-11-09 LAB — CBC
HCT: 32.5 % — ABNORMAL LOW (ref 40.0–52.0)
HEMOGLOBIN: 11.1 g/dL — AB (ref 13.0–18.0)
MCH: 28.2 pg (ref 26.0–34.0)
MCHC: 34.1 g/dL (ref 32.0–36.0)
MCV: 82.7 fL (ref 80.0–100.0)
Platelets: 263 10*3/uL (ref 150–440)
RBC: 3.93 MIL/uL — AB (ref 4.40–5.90)
RDW: 14.6 % — ABNORMAL HIGH (ref 11.5–14.5)
WBC: 8 10*3/uL (ref 3.8–10.6)

## 2016-11-09 MED ORDER — SENNOSIDES 8.8 MG/5ML PO SYRP: 5.0000 mL | ORAL_SOLUTION | Freq: Two times a day (BID) | ORAL | 0 refills | Status: AC | PRN

## 2016-11-09 MED ORDER — FENTANYL CITRATE (PF) 100 MCG/2ML IJ SOLN: 50.0000 ug | INTRAMUSCULAR | 0 refills | Status: AC | PRN

## 2016-11-09 MED ORDER — ENOXAPARIN SODIUM 40 MG/0.4ML ~~LOC~~ SOLN: 40.0000 mg | SUBCUTANEOUS | Status: AC

## 2016-11-09 MED ORDER — FLUCONAZOLE 40 MG/ML PO SUSR
200.0000 mg | Freq: Once | ORAL | Status: AC
  Administered 2016-11-09: 200 mg via ORAL
  Filled 2016-11-09: qty 5

## 2016-11-09 MED ORDER — ADULT MULTIVITAMIN LIQUID CH: 15.0000 mL | Freq: Every day | ORAL | Status: AC

## 2016-11-09 MED ORDER — IPRATROPIUM-ALBUTEROL 0.5-2.5 (3) MG/3ML IN SOLN: 3.0000 mL | Freq: Four times a day (QID) | RESPIRATORY_TRACT | Status: AC

## 2016-11-09 MED ORDER — VITAL 1.5 CAL PO LIQD: 1000.0000 mL | ORAL | Status: AC

## 2016-11-09 MED ORDER — FLUCONAZOLE 40 MG/ML PO SUSR: 100.0000 mg | Freq: Every day | ORAL | Status: DC

## 2016-11-09 MED ORDER — ONDANSETRON HCL 4 MG/2ML IJ SOLN: 4.0000 mg | Freq: Four times a day (QID) | INTRAMUSCULAR | 0 refills | Status: AC | PRN

## 2016-11-09 MED ORDER — DILTIAZEM 12 MG/ML ORAL SUSPENSION: 30.0000 mg | Freq: Four times a day (QID) | ORAL | Status: AC

## 2016-11-09 MED ORDER — HYDRALAZINE HCL 20 MG/ML IJ SOLN: 10.0000 mg | INTRAMUSCULAR | Status: AC | PRN

## 2016-11-09 MED ORDER — OXYCODONE HCL 5 MG/5ML PO SOLN: 5.0000 mg | ORAL | 0 refills | Status: AC | PRN

## 2016-11-09 MED ORDER — FLUCONAZOLE 40 MG/ML PO SUSR: 100.0000 mg | Freq: Every day | ORAL | 0 refills | Status: AC

## 2016-11-09 MED ORDER — BUDESONIDE 0.25 MG/2ML IN SUSP
0.2500 mg | Freq: Four times a day (QID) | RESPIRATORY_TRACT | 12 refills | Status: AC
Start: 2016-11-09 — End: ?

## 2016-11-09 MED ORDER — ACETAMINOPHEN 325 MG PO TABS: 650.0000 mg | ORAL_TABLET | Freq: Four times a day (QID) | ORAL | Status: AC | PRN

## 2016-11-09 MED ORDER — GABAPENTIN 250 MG/5ML PO SOLN: 200.0000 mg | Freq: Three times a day (TID) | ORAL | 12 refills | Status: AC

## 2016-11-09 MED ORDER — PRO-STAT SUGAR FREE PO LIQD: 30.0000 mL | Freq: Two times a day (BID) | ORAL | 0 refills | Status: AC

## 2016-11-09 MED ORDER — FREE WATER: 200.0000 mL | Freq: Three times a day (TID) | Status: AC

## 2016-11-09 MED ORDER — POLYETHYLENE GLYCOL 3350 17 G PO PACK: 17.0000 g | PACK | Freq: Every day | ORAL | 0 refills | Status: AC

## 2016-11-09 MED ORDER — CHLORHEXIDINE GLUCONATE 0.12% ORAL RINSE (MEDLINE KIT): 15.0000 mL | Freq: Two times a day (BID) | OROMUCOSAL | 0 refills | Status: AC

## 2016-11-09 NOTE — Care Management (Signed)
RNCM spoke with patient and his daughter [name]- both agree with transfer to [facility] today. RNCM will inform [name] of room number and [facility] to [facility] via text per her request. [name] states she will meet patient at [facility].

## 2016-11-09 NOTE — Care Management (Signed)
Patient approved for LTAC at [facility] today. I have text daughter [name] to let her know. I have also notified Loury with [facility] to make sure the still have a bed. He will call me back with room, provider and report number. RN and unit clerk updated. [facility] should transport if bed available.

## 2016-11-09 NOTE — Care Management (Signed)
Kevin Holden with [facility] will call floor in about an hour with accepting MD, room number, and report number for nurse. I have updated unit clerk. [employer] Ref/auth: [id number]. Text message sent to Kevin Holden (daughter) regarding bed placement could happen within one hour at [facility]- no response. I attempted to reach her at her work number also -no answer. I do not want to move this patient until daughter Kevin Holden confirms transfer. RNCM attempted to reach daughter Kevin Holden and Kevin Holden also without success.

## 2016-11-09 NOTE — Care Management (Signed)
The following information was given to RN and patient's daughter [name]: Room 207; Report number [phone]; Accepting [facility] provider Dr. Petra KubaKilpatrick.  RN asked to call [name] when [facility] arrives. No further RNCM needs.

## 2016-11-09 NOTE — Discharge Summary (Addendum)
Physician Transfer/Discharge Summary  Patient ID: Kevin Holden MRN: [id number] DOB/AGE: [birth year] 65 y.o.  Admit date: 10/15/2016 Discharge date: 11/09/2016  Admission Diagnoses: AECOPD.   Discharge Diagnoses:  Active Problems:   Acute on chronic respiratory failure with hypoxemia (HCC)   Protein-calorie malnutrition, severe   Feeding problem   Pressure injury of skin   Discharged Condition: Guarded.   Hospital Course:   05/18 Admitted by Hospitalist service with acute on chronic respiratory failure deemed due to AECOPD 05/18 Echo:The estimated ejection fraction was in the range of 55% to 60%. Wall motion was normal. Doppler parameters are consistent with abnormal left ventricular relaxation (grade 1 diastolic dysfunction).  05/19 Transferred to ICU/SDU for BiPAP. Failed BiPAP and intubated 05/20 Renal US: No cause for acute renal failure identified. Both kidneys are relatively small, measuring less than 9 cm  05/21 very agitated on WUA. No weaning 05/22 Very agitated on WUA. Dexmedetomidine initiated 05/23 increased airflow obstruction. Precedex changed to propofol. Continued problems with agitation on WUA 05/24 persistent severe airflow obstruction. PSV as tolerated. 05/25 family meeting held with pts 3 daughters to discuss goals of care pts daughters stated they would like to proceed with tracheostomy and peg tube placement if indicated. They also wanted to change pts code status to limited code to include NO CPR, ACLS medications, or defibrillation and/or cardioversion 05/28 Met with family, discussed status and plan, continue to want trach.  05/29 Eldest daughter continues to want trach, confirms DNR status. ; failed weaning trial.  05/30 Failed weaning trial due to tachypnea and tachycardia within 15 minutes.  05/31 tracheostomy tube placed 06/01 PEG tube placed 06/02 failed weaning.  06/04 on trach collar all day 06/05 comfortable on trach collar. Copious purulent  tracheal secretions 06/06 remains comfortable on trach collar. Remains mildly confused. Still with significant tracheal secretions 06/09 continues to have copious purulent secretions from tracheostomy tube. Tracheostomy tube changed to uncuffed #6 Shiley. Failed weaning. In the evening, he had increased shortness of breath. His tracheostomy tube was changed to a cuffed Shiley. He was placed back on the ventilator for the night. 06/10 back on trach collar. Comfortable. Cognition intact. 06/11 had to be placed back on ventilator overnight.  06/12 Trach collar during day.      Discharge Exam: Blood pressure 116/65, pulse 95, temperature 99 F (37.2 C), resp. rate 12, height 6' (1.829 m), weight 115 lb 1.3 oz (52.2 kg), SpO2 97 %. Please see my note from today.   Disposition: LTAC transfer.    No current facility-administered medications for this encounter.   Current Outpatient Prescriptions:  .  albuterol (PROVENTIL HFA;VENTOLIN HFA) 108 (90 Base) MCG/ACT inhaler, Inhale 1-2 puffs into the lungs every 6 (six) hours as needed for wheezing or shortness of breath., Disp: 6.7 g, Rfl: 0 .  loratadine (CLARITIN) 10 MG tablet, Take 10 mg by mouth daily. , Disp: , Rfl:  .  mometasone-formoterol (DULERA) 200-5 MCG/ACT AERO, Inhale 2 puffs into the lungs 2 (two) times daily., Disp: , Rfl:  .  tiotropium (SPIRIVA) 18 MCG inhalation capsule, Place 18 mcg into inhaler and inhale daily., Disp: , Rfl:  .  acetaminophen (TYLENOL) 325 MG tablet, Place 2 tablets (650 mg total) into feeding tube every 6 (six) hours as needed for mild pain (or Fever >/= 101)., Disp: , Rfl:  .  Amino Acids-Protein Hydrolys (FEEDING SUPPLEMENT, PRO-STAT SUGAR FREE 64,) LIQD, Place 30 mLs into feeding tube 2 (two) times daily., Disp: 900 mL, Rfl: 0 .  budesonide (PULMICORT) 0.25 MG/2ML nebulizer solution, Take 2 mLs (0.25 mg total) by nebulization every 6 (six) hours., Disp: 60 mL, Rfl: 12 .  chlorhexidine gluconate, MEDLINE KIT,  (PERIDEX) 0.12 % solution, 15 mLs by Mouth Rinse route 2 (two) times daily., Disp: 120 mL, Rfl: 0 .  diltiazem (CARDIZEM) 10 mg/ml oral suspension, Place 3 mLs (30 mg total) into feeding tube every 6 (six) hours., Disp: , Rfl:  .  enoxaparin (LOVENOX) 40 MG/0.4ML injection, Inject 0.4 mLs (40 mg total) into the skin daily., Disp: 0 Syringe, Rfl:  .  fentaNYL (SUBLIMAZE) 100 MCG/2ML injection, Inject 1 mL (50 mcg total) into the vein every 2 (two) hours as needed for moderate pain., Disp: 2 mL, Rfl: 0 .  fluconazole (DIFLUCAN) 40 MG/ML suspension, Take 2.5 mLs (100 mg total) by mouth daily., Disp: 35 mL, Rfl: 0 .  gabapentin (NEURONTIN) 250 MG/5ML solution, Place 4 mLs (200 mg total) into feeding tube every 8 (eight) hours., Disp: , Rfl: 12 .  hydrALAZINE (APRESOLINE) 20 MG/ML injection, Inject 0.5-2 mLs (10-40 mg total) into the vein every 4 (four) hours as needed (To maintain SBP <160 mmHg)., Disp: 1 mL, Rfl:  .  ipratropium-albuterol (DUONEB) 0.5-2.5 (3) MG/3ML SOLN, Take 3 mLs by nebulization every 6 (six) hours., Disp: 360 mL, Rfl:  .  Multiple Vitamin (MULTIVITAMIN) LIQD, Place 15 mLs into feeding tube daily., Disp: , Rfl:  .  Nutritional Supplements (FEEDING SUPPLEMENT, VITAL 1.5 CAL,) LIQD, Place 1,000 mLs into feeding tube continuous., Disp: , Rfl:  .  ondansetron (ZOFRAN) 4 MG/2ML SOLN injection, Inject 2 mLs (4 mg total) into the vein every 6 (six) hours as needed for nausea., Disp: 2 mL, Rfl: 0 .  oxyCODONE (ROXICODONE) 5 MG/5ML solution, Place 5 mLs (5 mg total) into feeding tube every 4 (four) hours as needed for moderate pain., Disp: , Rfl: 0 .  polyethylene glycol (MIRALAX / GLYCOLAX) packet, Place 17 g into feeding tube daily., Disp: 14 each, Rfl: 0 .  sennosides (SENOKOT) 8.8 MG/5ML syrup, Place 5 mLs into feeding tube 2 (two) times daily as needed for mild constipation., Disp: 240 mL, Rfl: 0 .  Water For Irrigation, Sterile (FREE WATER) SOLN, Place 200 mLs into feeding tube every 8  (eight) hours., Disp: , Rfl:   Signed: Laverle Hobby 11/09/2016, 1:46 PM

## 2016-11-09 NOTE — Progress Notes (Signed)
PULMONARY / CRITICAL CARE MEDICINE   Name: Kevin Holden MRN: [id number] DOB: 1951-02-14    ADMISSION DATE:  10/15/2016    PT PROFILE:   66 y.o. male smoker intubated after failing BiPAP with diagnosis of AECOPD  MAJOR EVENTS/TEST RESULTS: 05/18 Admitted by Hospitalist service with acute on chronic respiratory failure deemed due to AECOPD 05/18 Echo:The estimated ejection   fraction was in the range of 55% to 60%. Wall motion was normal. Doppler parameters are con sistent with abnormal left ventricular relaxation (grade 1 diastolic dysfunction).  05/19 Transferred to ICU/SDU for BiPAP. Failed BiPAP and intubated 05/20 Renal US: No cause for acute renal failure identified. Both kidneys are relatively small, measuring less than 9 cm  05/21 very agitated on WUA. No weaning 05/22 Very agitated on WUA. Dexmedetomidine initiated 05/23 increased airflow obstruction. Precedex changed to propofol. Continued problems with agitation on WUA 05/24 persistent severe airflow obstruction. PSV as tolerated. 05/25 family meeting held with pts 3 daughters to discuss goals of care pts daughters stated they would like to proceed with tracheostomy and peg tube placement if indicated. They also wanted to change pts code status to limited code to include NO CPR, ACLS medications, or defibrillation and/or cardioversion 05/28 Met with family, discussed status and plan, continue to want trach.  05/29 Eldest daughter continues to want trach, confirms DNR status. ; failed weaning trial.  05/30 Failed weaning trial due to tachypnea and tachycardia within 15 minutes.  05/31 tracheostomy tube placed 06/01 PEG tube placed 06/02 failed weaning.  06/04 on trach collar all day 06/05 comfortable on trach collar. Copious purulent tracheal secretions 06/06 remains comfortable on trach collar. Remains mildly confused. Still with significant tracheal secretions 06/09 continues to have copious purulent secretions from  tracheostomy tube. Tracheostomy tube changed to uncuffed #6 Shiley. Failed weaning. In the evening, he had increased shortness of breath. His tracheostomy tube was changed to a cuffed Shiley. He was placed back on the ventilator for the night. 06/10 back on trach collar. Comfortable. Cognition intact. 06/11 had to be placed back on ventilator overnight.  06/12 Trach collar during day.   INDWELLING DEVICES:: ETT 05/19 >> 05/31 L IJ CVL 05/19 >> 06/04 Trach tube 05/31 >>   MICRO DATA: MRSA PCR 05/19 >> NEG Urine 05/20 >> NEG Resp 06/05 >>  normal flora  ANTIMICROBIALS:  Levofloxacin 05/19 >> 05/22 Ceftriaxone 05/22 >>05/25 Levofloxacin 06/05 >>   SUBJ: Patient is awake, does not appear to be in any acute distress. Continue to have copious secretions, suspect oral thrush.   VITAL SIGNS: BP 116/65   Pulse 95   Temp 99 F (37.2 C)   Resp 12   Ht 6' (1.829 m)   Wt 115 lb 1.3 oz (52.2 kg)   SpO2 97%   BMI 15.61 kg/m   HEMODYNAMICS:    VENTILATOR SETTINGS: Vent Mode: PRVC FiO2 (%):  [40 %-50 %] 40 % Set Rate:  [14 bmp] 14 bmp Vt Set:  [500 mL] 500 mL PEEP:  [5 cmH20] 5 cmH20 Plateau Pressure:  [20 cmH20] 20 cmH20  INTAKE / OUTPUT: I/O last 3 completed shifts: In: 7588 [NG/GT:1710] Out: 2200 [Urine:2200]  PHYSICAL EXAMINATION: Gen: No distress on ATC, RASS 0 Neuro: No focal deficits, diffusely weak, follows commands  HEENT: NCAT, sclerae white, very poor dentition Neck:; Erosion under flanges of tracheostomy tube with purulent drainage Lungs: Decreased air entry in both lungs. Cardiovascular: Regular, no murmurs noted Abdomen: Bowel sounds present, soft, non tender, non distended  Extremities: Warm, no edema Skin: intact no rashes or lesions  LABS:  BMET  Recent Labs Lab 11/05/16 0439 11/08/16 0402 11/09/16 0533  NA 145 134* 136  K 3.7 3.9 4.0  CL 107 96* 95*  CO2 30 31 34*  BUN 21* 16 14  CREATININE 0.83 0.73 0.58*  GLUCOSE 146* 124* 113*     Electrolytes  Recent Labs Lab 11/05/16 0439 11/08/16 0402 11/09/16 0533  CALCIUM 8.7* 8.6* 8.8*  MG  --   --  1.9    CBC  Recent Labs Lab 11/05/16 0439 11/08/16 0402 11/09/16 0533  WBC 8.2 7.3 8.0  HGB 10.8* 10.5* 11.1*  HCT 32.7* 30.8* 32.5*  PLT 229 240 263    ABG No results for input(s): PHART, PCO2ART, PO2ART in the last 168 hours.    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 Prolonged mechanical ventilation - tolerating trach collar Purulent tracheobronchitis Hypertension, marginally controlled Hypernatremia, resolved G tube status Protein-calorie malnutrition  DM 2 with mild hyperglycemia, controlled Steroid induced hyperglycemia - prednisone taper has been completed Profound deconditioning  Oral thrush.  P: Continue nebulized bronchodilators and steroids Continue supplemental oxygen  Continue mechanical ventilation as needed; qhs and prn.  Continue empiric levofloxacin initiated 06/05 - 7 day course ordered Continue diltiazem initiated 06/05 Continue tube feeds  Continue PT and OT  Passy-Muir valve ordered, not yet tolerated.  D/w [employer], pt is accepted for LTAC transfer.  Added fluconazole for thrush and suspected candidal bronchitis which may be contributing to copious secretions.    [name], M.D.  PCCM service Pager [phone] 11/09/2016 1:43 PM

## 2016-11-09 NOTE — Progress Notes (Signed)
Report called to Hartford PoliIsatu Sesay, RN at [facility] at 1700 hours. [facility] requested. [facility] at the bedside at approximately 1750 hours. Report given to Marguerite OleaJerry Ward, EMT-P. Paperwork along w/ imaging disks given to same. Patient care transferred to same. [facility] leaving the unit w/ patient at 1818 hours.

## 2016-11-09 NOTE — Progress Notes (Signed)
Pt. Now on trach collar during the day.  [facility]  I will sign off.  Weldon PickingSusan Aman Bonet PT Inpatient Rehab Admissions Coordinator Cell [phone] Office [phone]

## 2016-11-09 NOTE — Progress Notes (Signed)
Speech Language Pathology Treatment: Hillary Bow Speaking valve  Patient Details Name: Kevin Holden MRN: [id number] DOB: 1950-09-21 Today's Date: 11/09/2016 Time: 1030-1105 SLP Time Calculation (min) (ACUTE ONLY): 35 min  Assessment / Plan / Recommendation Clinical Impression  Pt seen for ongoing assessment for PMV use and toleration this morning. ENT downsized pt's trach from a Shiley #8 to a [employer] #6 yesterday evening, and pt has been tolerating this well per report. Pt continues to have copious amounts of thick tracheal secretions. Pt's cuff is deflated at baseline; on trach collar O2 support. Noted min oral secretions today as well. Pt's mouth and trach area were lightly cleaned of phlegm. Upon oral examination, noted a thick, white coating fully over his tongue suggestive of THRUSH. If it is such, THRUSH could help to explain the continued copious tracheal secretions and expectoration of such as well as his c/o sore throat(not at the area of the trach itself). Pt indicated this phlegm was making his throat feel "full". Due to the suspected Oral Thrush, pt's Larynx/upper airway could be impacted by the Thrush as well as the Pharynx/Esophagus - all areas excreting increased, stringy phlegm. MD consulted and agreed w/ initiation of tx for Thrush and a PPI. PMV was placed on trach (cuff deflated at baseline) for ~15+ minutes w/ no decline in O2 sats or HR/RR; no discomfort or change in respiratory presentation. Pt's vocal quality was c/b low volume and a gravely, hoarse quality. Pt was able to add min phonation to his mouthing/verbalizations thus increasing his intelligibility somewhat. Pt required verbal cues and model to better use breath support for speech tasks. Noted tracheal secretions during session. PMV was removed after about 20 minutes; care and storage was discussed w/ pt; signs placed in room.  Pt would benefit from ongoing PMV tx(monitored) to increase wear and use for communication during  ADLs. Pt requires skilled ST Services to continue to focus on breathing and voice/speech exercises to strengthen and enhance verbal communication ability w/ the PMV. Pt would benefit from full PMV wear to target next step in Rehab - assessing swallowing for any dysphagia; and toleration of an oral diet least restrictive in nature(pt has poor dentition). This was discussed w/ NSG/MD/CM. Recommend frequent oral care daily.      HPI HPI: Pt is a 66 y/o male with past medical history significant for COPD and hypertension presents to the emergency department complaining of shortness of breath. The patient was recently discharged from this facility after treatment of COPD exacerbation. He was completed a prednisone taper as well as azithromycin but reports that for the last few days he has awakened at night feeling feverish. He he admits to a cough but he reports that he is unable to bring anything up. The patient also admits to having swelling in his lower extremities. With this exacerbation he describes a feeling of tightness rising from his thighs into his abdomen and then into his chest where as on previous occasions he feels as if the swelling were occurring "from the top down". Upon discharge he was prescribed low-dose Lasix presumably for venous insufficiency but he states that this only helps sometimes. In the emergency department the patient was found to have oxygen saturations in the low 80s. He was placed on 4 L of oxygen via nasal cannula which improved his oxygen saturations somewhat. The next day, Transferred to ICU/SDU for BiPAP. Failed BiPAP and intubated w/ dx of AECOPD. Pt received a tracheostomy on 10/28/16; PEG placed on 10/29/16 for  nutrition/hydration. Pt is currently weaning from vent at night to full trach collar O2 support. Pt has a Shiley #6 cuffed trach placed currently (downsized by ENT yesterday, 11/08/16 per SLP request) w/ cuff deflated at baseline prior to session beginning. Pt is using his  communication board some for communication but mostly mouths and gestures w/ staff. Pt's tracheal secretions continue - c/b copious and thick, and frequent requiring suctioning. Oral secretions noted as well.       SLP Plan  Continue with current plan of care       Recommendations  Diet recommendations: NPO (w/ TFs via PEG) Medication Administration: Via alternative means (via PEG)      Patient may use Passy-Muir Speech Valve: with SLP only;Intermittently with supervision;During all therapies with supervision PMSV Supervision: Full MD: Please consider changing trach tube to :  (completed)         General recommendations:  (all therapies) Oral Care Recommendations: Oral care QID;Staff/trained caregiver to provide oral care;Patient independent with oral care Follow up Recommendations: LTACH SLP Visit Diagnosis: Aphonia (R49.1) (PMV tx, care) Plan: Continue with current plan of care       GO                [name], MS, CCC-SLP Watson,Katherine 11/09/2016, 3:50 PM

## 2016-11-09 NOTE — Care Management (Signed)
RNCM spoke with Gweneth DimitriLisa Jacobs with [facility] [phone] [facility] yesterday and that page was not received for peer to peer. [facility] StanleyLisa will [facility] by phone this time. Confidential text message sent to daughter [name] [phone](per her request) stating "[facility] still pending authorization". RNCM will continue to follow.

## 2017-10-29 DEATH — deceased

## 2018-02-10 IMAGING — DX DG CHEST 1V
2 series · 2 of 2 positions shown · non-contrast
Comparison: Portable exam 0542 hours compared to 10/24/2016

CLINICAL DATA: Dyspnea, history asthma, COPD, hypertension

EXAM:
CHEST 1 VIEW

[chest ap (1 of 2)]
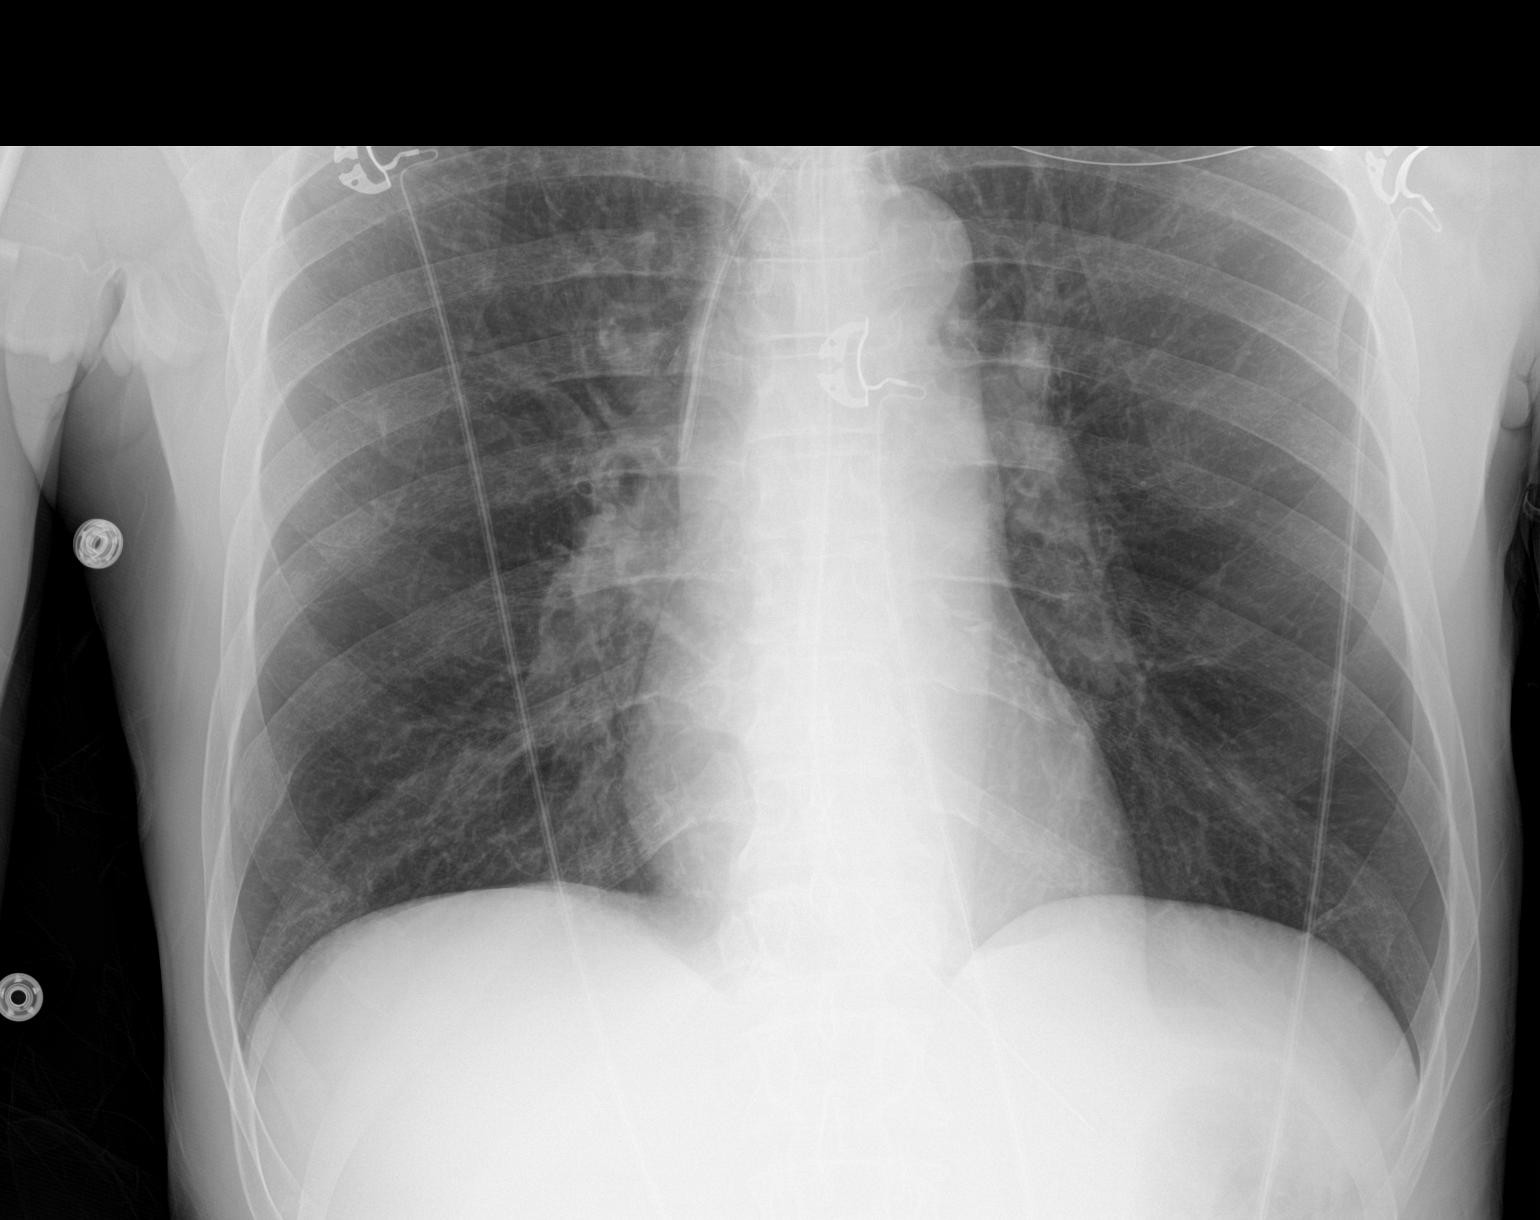

[chest ap (2 of 2)]
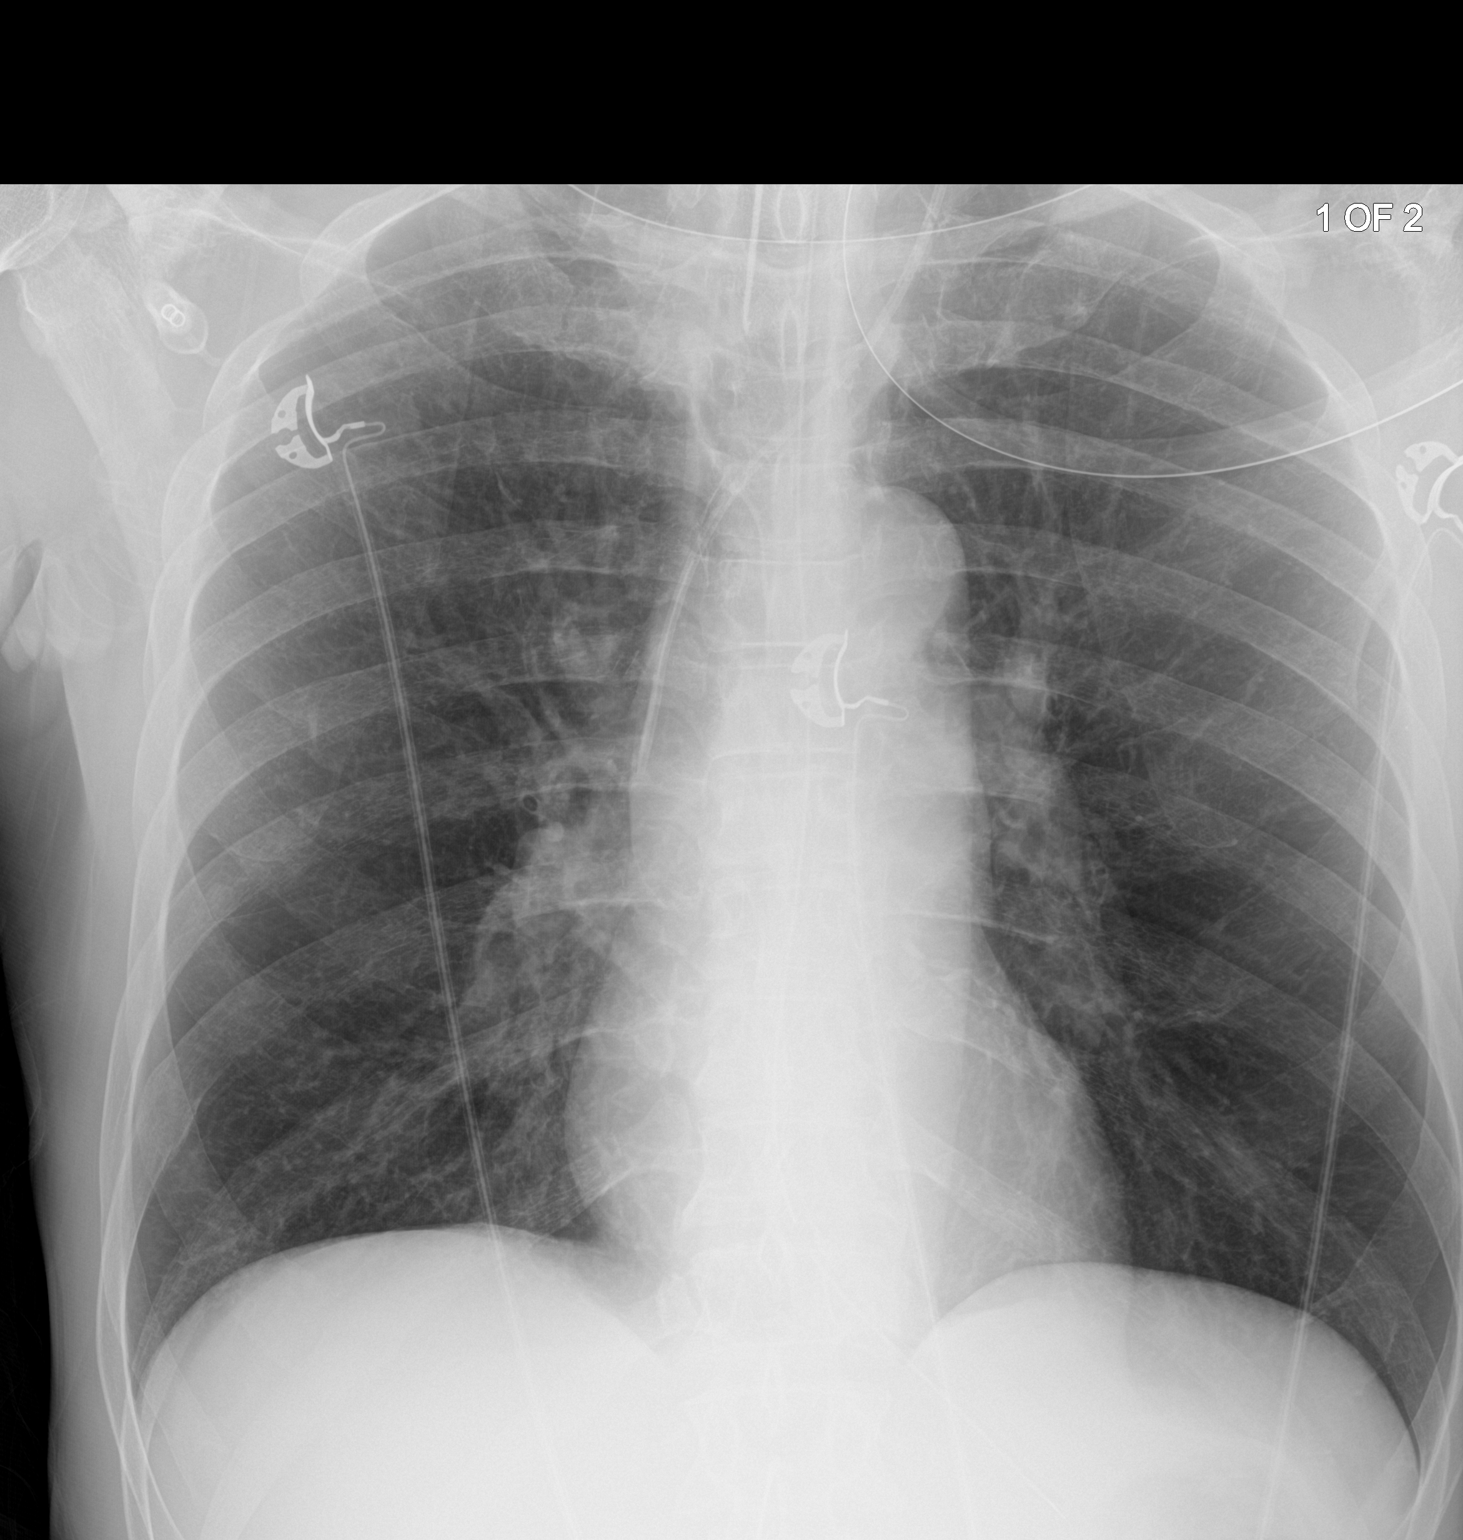

[2 of 2 positions shown; findings below may reference images not displayed]

FINDINGS: Tip of endotracheal tube projects 7.1 cm above carina.

Nasogastric tube extends into stomach though the proximal side-port
projects over the distal esophagus.

LEFT jugular central venous catheter with tip projecting over SVC.

Normal heart size, mediastinal contours, and pulmonary vascularity.

Lungs hyperinflated but clear.

Minimal central peribronchial thickening.

No pleural effusion or pneumothorax.
IMPRESSION: COPD changes without acute infiltrate.

Advanced nasogastric tube 5 cm to place proximal side-port within
the stomach.

## 2018-02-11 IMAGING — DX DG CHEST 1V
2 series · 2 of 2 positions shown · non-contrast
Comparison: 10/26/2016 and 10/24/2016

CLINICAL DATA: respiratory failure.

EXAM:
CHEST 1 VIEW

[chest ap (1 of 2)]
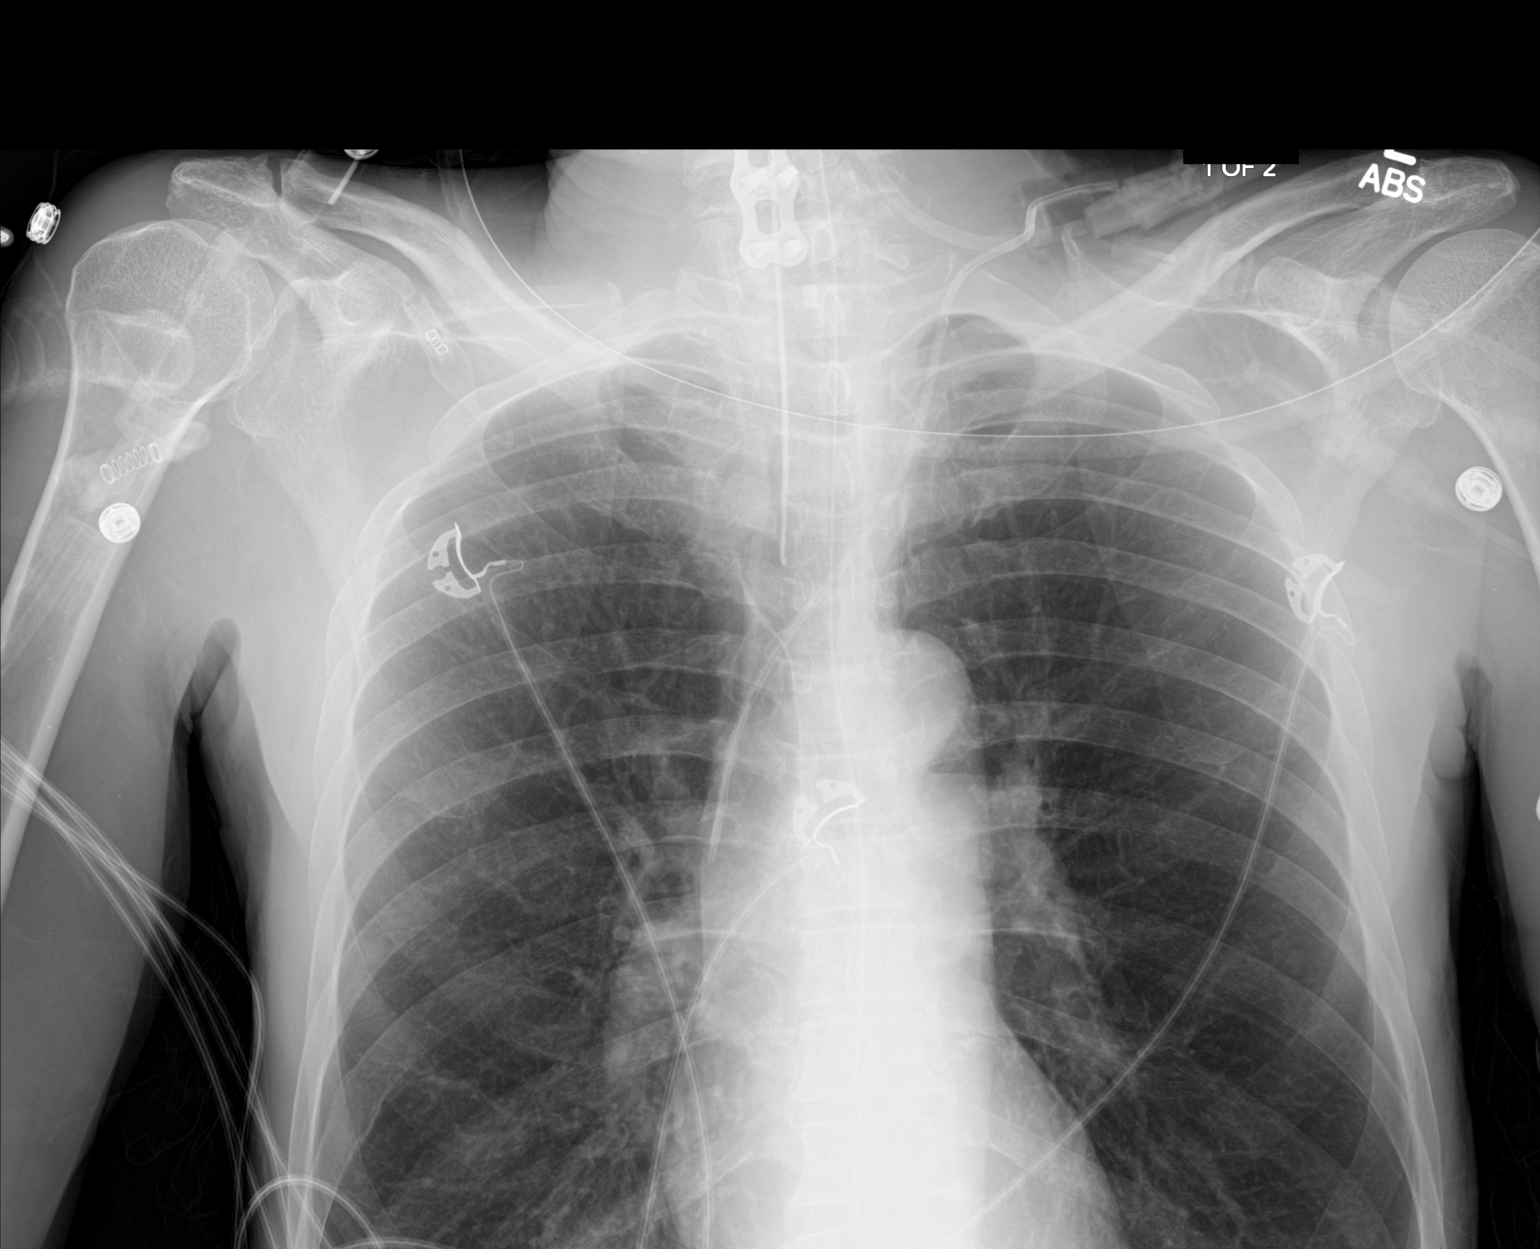

[chest ap (2 of 2)]
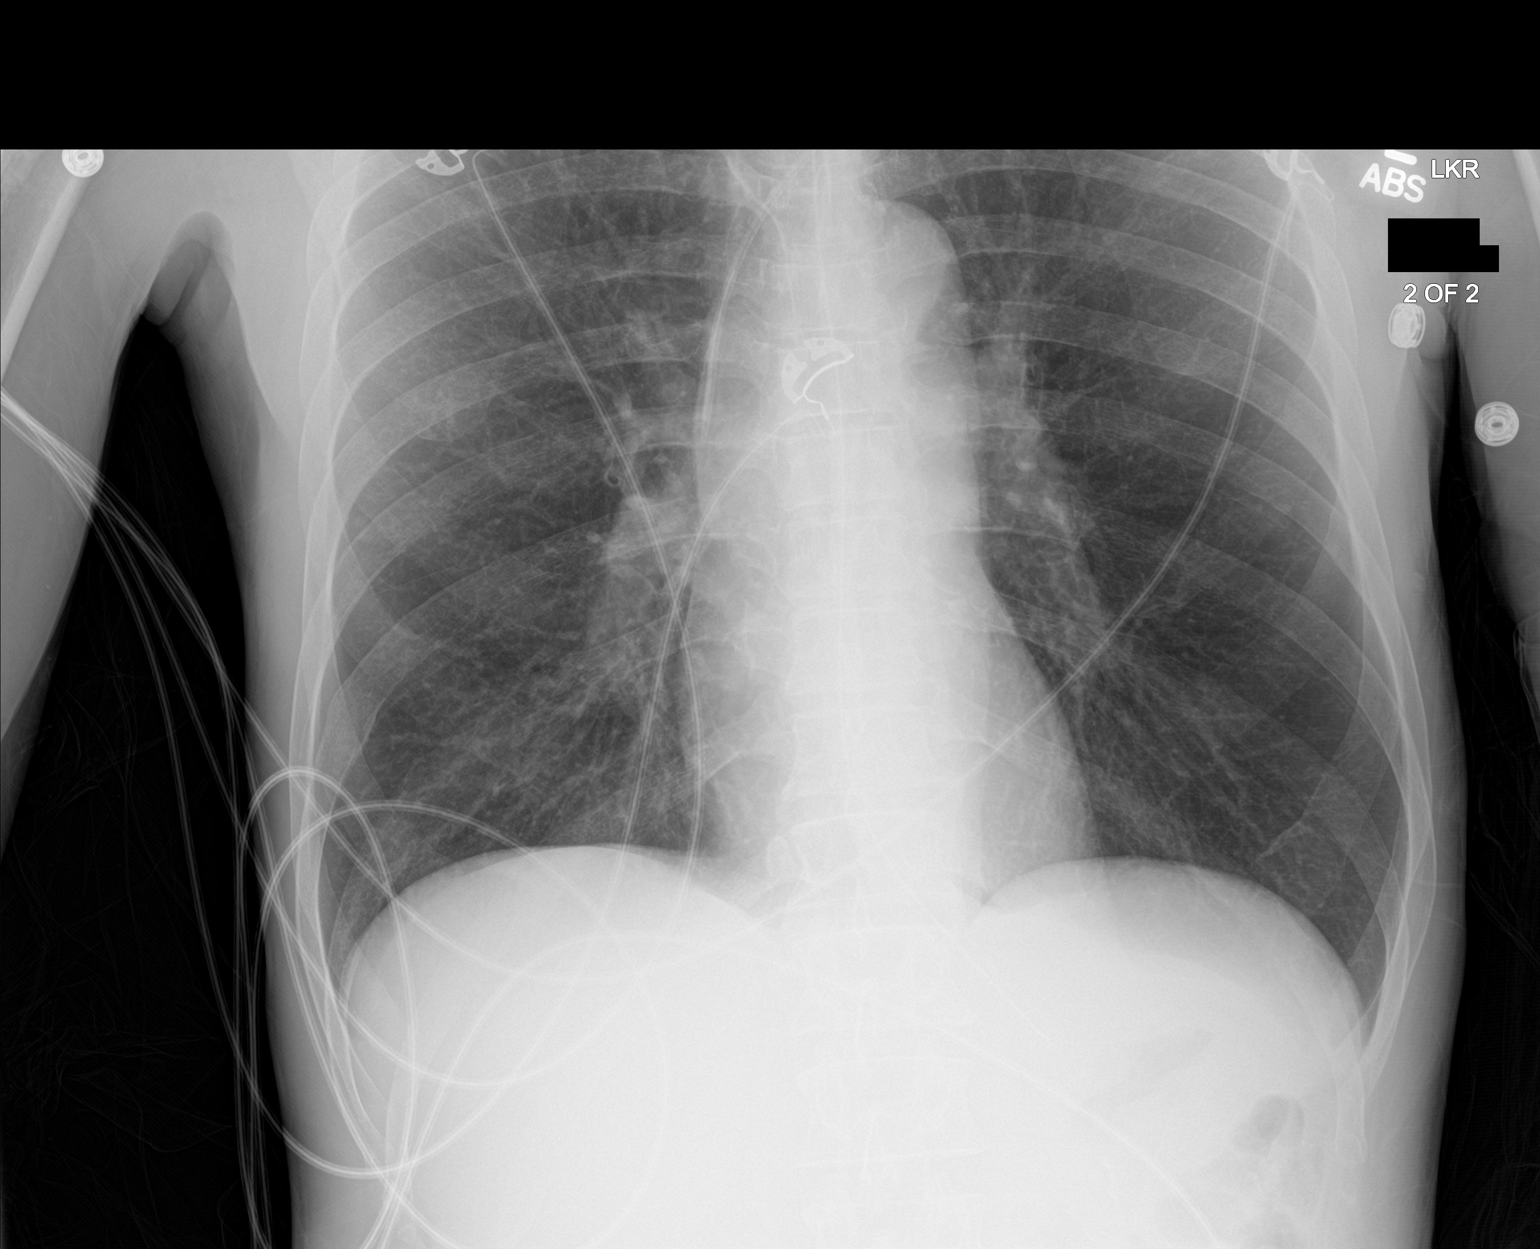

[2 of 2 positions shown; findings below may reference images not displayed]

FINDINGS: Endotracheal tube and central venous catheter appear in good
position. Tip of the NG tube is in the upper fundus of the stomach
with the proximal side hole in the distal esophagus, unchanged.

The heart size and pulmonary vascularity are normal and the lungs
are clear. No effusions. No bone abnormality.
IMPRESSION: No acute abnormalities.  No change since the prior study.

## 2018-02-23 IMAGING — DX DG CHEST 1V PORT
2 series · 2 of 2 positions shown · non-contrast
Comparison: Prior radiograph from 11/06/2016.

CLINICAL DATA: Initial evaluation for respiratory failure.

EXAM:
PORTABLE CHEST 1 VIEW

[chest ap (1 of 2)]
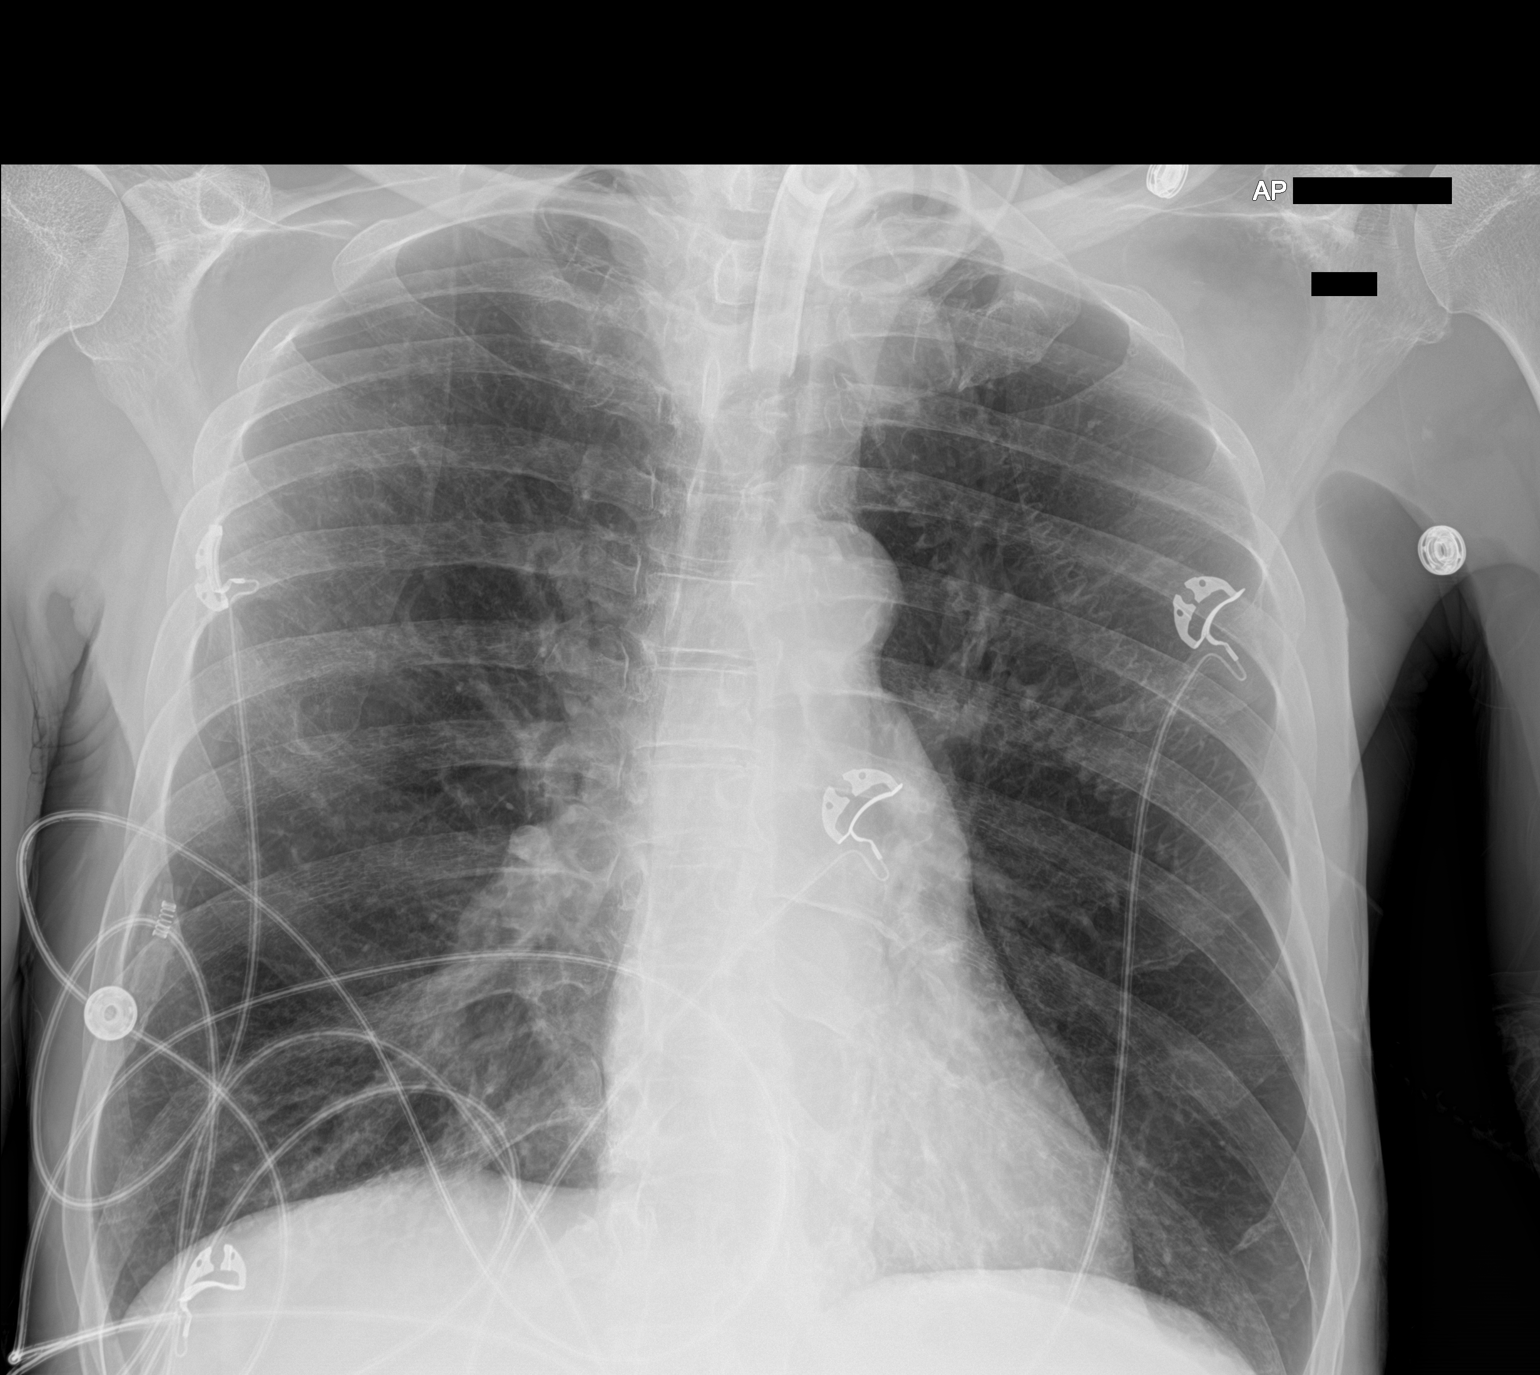

[chest ap (2 of 2)]
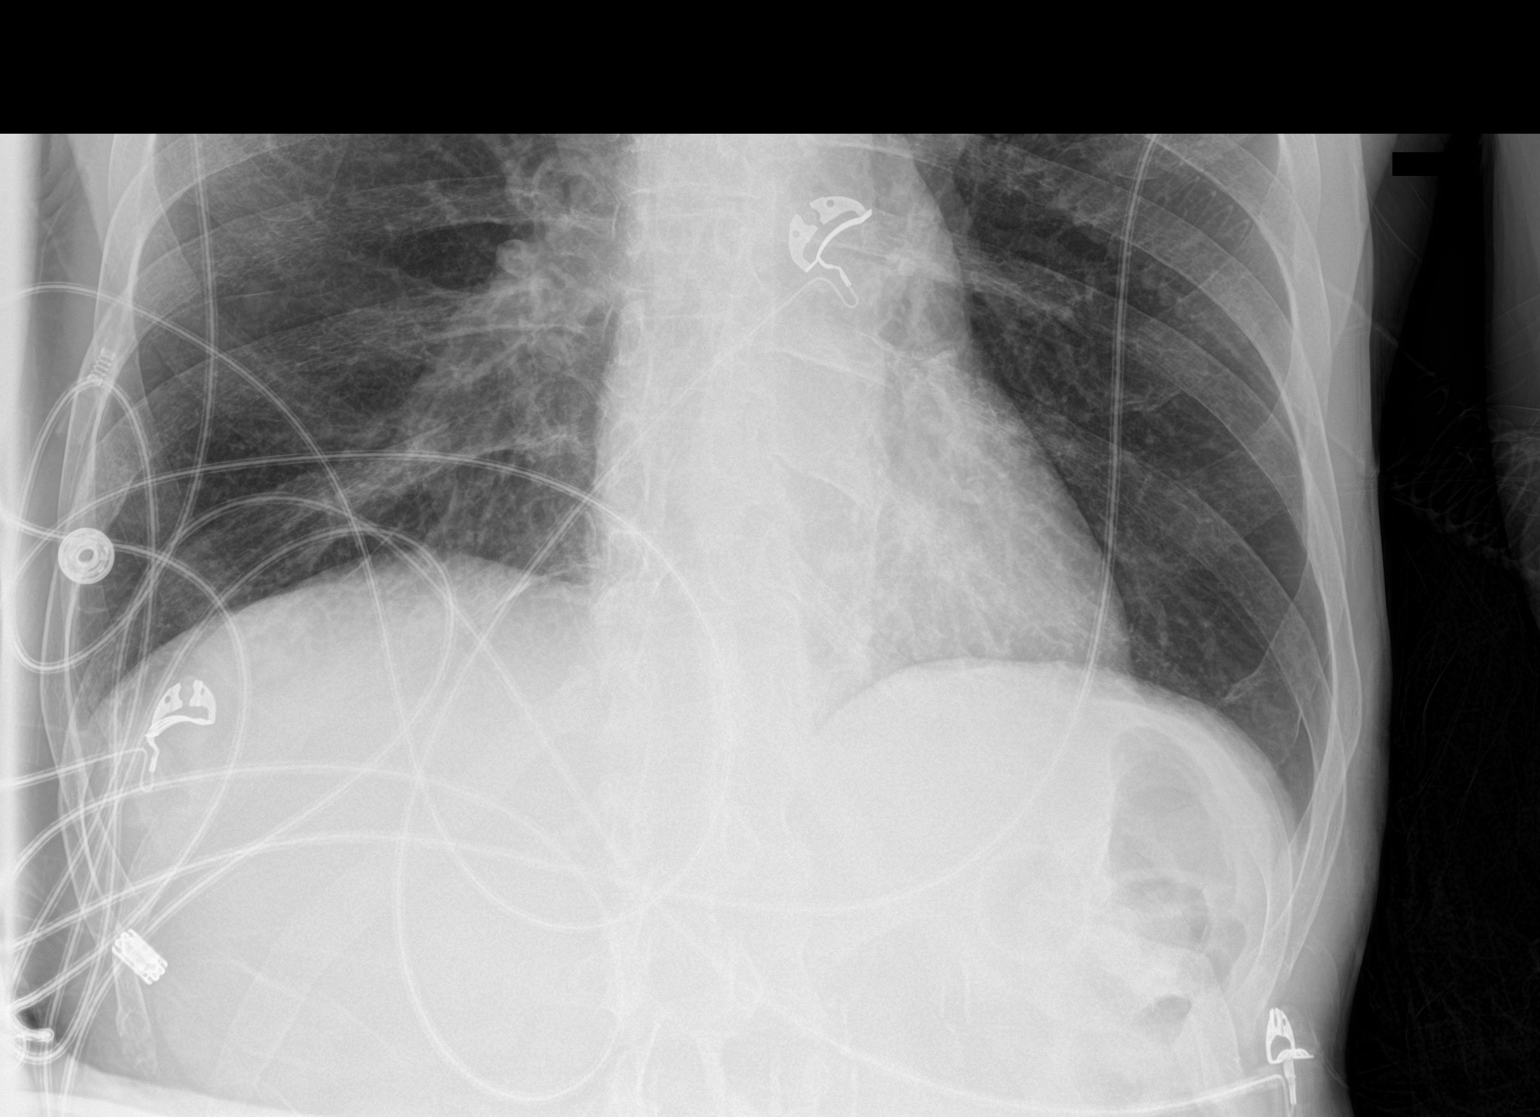

[2 of 2 positions shown; findings below may reference images not displayed]

FINDINGS: Tracheostomy tube overlies the upper airway. Cardiac and mediastinal
silhouettes are stable, and remain within normal limits. Aortic
atherosclerosis.

Lungs well inflated. Underlying changes related COPD. No new focal
infiltrates. No pulmonary edema or definite pleural effusion. No
pneumothorax.

Osseous structures unchanged.
IMPRESSION: 1. COPD.  No other active cardiopulmonary disease identified.
2. Tracheostomy tube in place at the upper airway.
